# Patient Record
Sex: Male | Born: 1945 | Race: White | Hispanic: No | Marital: Married | Smoking: Never smoker
Health system: Southern US, Community
[De-identification: ages and names within clinical notes are randomized; demographics above are authoritative.]

## PROBLEM LIST (undated history)

## (undated) DIAGNOSIS — G629 Polyneuropathy, unspecified: Secondary | ICD-10-CM

## (undated) DIAGNOSIS — I10 Essential (primary) hypertension: Secondary | ICD-10-CM

## (undated) DIAGNOSIS — C20 Malignant neoplasm of rectum: Secondary | ICD-10-CM

## (undated) DIAGNOSIS — Z77098 Contact with and (suspected) exposure to other hazardous, chiefly nonmedicinal, chemicals: Secondary | ICD-10-CM

## (undated) DIAGNOSIS — E119 Type 2 diabetes mellitus without complications: Secondary | ICD-10-CM

## (undated) DIAGNOSIS — Z978 Presence of other specified devices: Secondary | ICD-10-CM

## (undated) DIAGNOSIS — Z960 Presence of urogenital implants: Secondary | ICD-10-CM

## (undated) DIAGNOSIS — Z7739 Contact with and (suspected) exposure to other war theater: Secondary | ICD-10-CM

## (undated) DIAGNOSIS — G20A1 Parkinson's disease without dyskinesia, without mention of fluctuations: Secondary | ICD-10-CM

## (undated) HISTORY — PX: TONSILLECTOMY: SUR1361

## (undated) HISTORY — PX: ILEOSTOMY: SHX1783

---

## 2018-04-03 ENCOUNTER — Other Ambulatory Visit: Payer: Self-pay

## 2018-04-03 ENCOUNTER — Emergency Department
Admission: EM | Admit: 2018-04-03 | Discharge: 2018-04-03 | Disposition: A | Discharge status: Other Acute Inpatient Hospital | Payer: PPO | Attending: Emergency Medicine | Admitting: Emergency Medicine

## 2018-04-03 ENCOUNTER — Encounter: Payer: Self-pay | Admitting: Emergency Medicine

## 2018-04-03 ENCOUNTER — Emergency Department: Payer: PPO

## 2018-04-03 DIAGNOSIS — Z85048 Personal history of other malignant neoplasm of rectum, rectosigmoid junction, and anus: Secondary | ICD-10-CM | POA: Diagnosis not present

## 2018-04-03 DIAGNOSIS — A419 Sepsis, unspecified organism: Secondary | ICD-10-CM | POA: Insufficient documentation

## 2018-04-03 DIAGNOSIS — E119 Type 2 diabetes mellitus without complications: Secondary | ICD-10-CM | POA: Insufficient documentation

## 2018-04-03 DIAGNOSIS — T8149XA Infection following a procedure, other surgical site, initial encounter: Secondary | ICD-10-CM | POA: Insufficient documentation

## 2018-04-03 DIAGNOSIS — K6811 Postprocedural retroperitoneal abscess: Secondary | ICD-10-CM | POA: Diagnosis not present

## 2018-04-03 DIAGNOSIS — T8143XA Infection following a procedure, organ and space surgical site, initial encounter: Secondary | ICD-10-CM

## 2018-04-03 DIAGNOSIS — Y658 Other specified misadventures during surgical and medical care: Secondary | ICD-10-CM | POA: Insufficient documentation

## 2018-04-03 DIAGNOSIS — N400 Enlarged prostate without lower urinary tract symptoms: Secondary | ICD-10-CM | POA: Diagnosis not present

## 2018-04-03 DIAGNOSIS — I10 Essential (primary) hypertension: Secondary | ICD-10-CM | POA: Insufficient documentation

## 2018-04-03 HISTORY — DX: Type 2 diabetes mellitus without complications: E11.9

## 2018-04-03 HISTORY — DX: Essential (primary) hypertension: I10

## 2018-04-03 HISTORY — DX: Malignant neoplasm of rectum: C20

## 2018-04-03 LAB — CBC WITH DIFFERENTIAL/PLATELET
Abs Immature Granulocytes: 0.15 10*3/uL — ABNORMAL HIGH (ref 0.00–0.07)
BASOS ABS: 0.1 10*3/uL (ref 0.0–0.1)
BASOS PCT: 1 %
EOS PCT: 6 %
Eosinophils Absolute: 0.7 10*3/uL — ABNORMAL HIGH (ref 0.0–0.5)
HCT: 27.8 % — ABNORMAL LOW (ref 39.0–52.0)
Hemoglobin: 9 g/dL — ABNORMAL LOW (ref 13.0–17.0)
IMMATURE GRANULOCYTES: 1 %
Lymphocytes Relative: 9 %
Lymphs Abs: 1 10*3/uL (ref 0.7–4.0)
MCH: 27.8 pg (ref 26.0–34.0)
MCHC: 32.4 g/dL (ref 30.0–36.0)
MCV: 85.8 fL (ref 80.0–100.0)
Monocytes Absolute: 1 10*3/uL (ref 0.1–1.0)
Monocytes Relative: 9 %
Neutro Abs: 8.3 10*3/uL — ABNORMAL HIGH (ref 1.7–7.7)
Neutrophils Relative %: 74 %
PLATELETS: 443 10*3/uL — AB (ref 150–400)
RBC: 3.24 MIL/uL — ABNORMAL LOW (ref 4.22–5.81)
RDW: 14.3 % (ref 11.5–15.5)
WBC: 11.2 10*3/uL — AB (ref 4.0–10.5)
nRBC: 0 % (ref 0.0–0.2)

## 2018-04-03 LAB — COMPREHENSIVE METABOLIC PANEL
ALBUMIN: 3.3 g/dL — AB (ref 3.5–5.0)
ALT: 37 U/L (ref 0–44)
ANION GAP: 10 (ref 5–15)
AST: 31 U/L (ref 15–41)
Alkaline Phosphatase: 61 U/L (ref 38–126)
BILIRUBIN TOTAL: 0.7 mg/dL (ref 0.3–1.2)
BUN: 25 mg/dL — AB (ref 8–23)
CHLORIDE: 104 mmol/L (ref 98–111)
CO2: 20 mmol/L — ABNORMAL LOW (ref 22–32)
Calcium: 9.3 mg/dL (ref 8.9–10.3)
Creatinine, Ser: 1.59 mg/dL — ABNORMAL HIGH (ref 0.61–1.24)
GFR calc Af Amer: 49 mL/min — ABNORMAL LOW (ref >60)
GFR, EST NON AFRICAN AMERICAN: 42 mL/min — AB (ref >60)
GLUCOSE: 145 mg/dL — AB (ref 70–99)
POTASSIUM: 4.1 mmol/L (ref 3.5–5.1)
Sodium: 134 mmol/L — ABNORMAL LOW (ref 135–145)
TOTAL PROTEIN: 7.3 g/dL (ref 6.5–8.1)

## 2018-04-03 LAB — LIPASE, BLOOD: LIPASE: 52 U/L — AB (ref 11–51)

## 2018-04-03 LAB — LACTIC ACID, PLASMA: Lactic Acid, Venous: 0.7 mmol/L (ref 0.5–1.9)

## 2018-04-03 MED ORDER — METRONIDAZOLE IN NACL 5-0.79 MG/ML-% IV SOLN
500.0000 mg | Freq: Three times a day (TID) | INTRAVENOUS | Status: DC
  Administered 2018-04-03: 500 mg via INTRAVENOUS
  Filled 2018-04-03: qty 100

## 2018-04-03 MED ORDER — SODIUM CHLORIDE 0.9 % IV SOLN
1.0000 g | Freq: Three times a day (TID) | INTRAVENOUS | Status: DC
  Filled 2018-04-03 (×2): qty 1

## 2018-04-03 MED ORDER — IOHEXOL 300 MG/ML  SOLN
75.0000 mL | Freq: Once | INTRAMUSCULAR | Status: AC | PRN
  Administered 2018-04-03: 75 mL via INTRAVENOUS

## 2018-04-03 MED ORDER — SODIUM CHLORIDE 0.9 % IV BOLUS
1000.0000 mL | Freq: Once | INTRAVENOUS | Status: AC
  Administered 2018-04-03: 1000 mL via INTRAVENOUS

## 2018-04-03 MED ORDER — SODIUM CHLORIDE 0.9 % IV SOLN
2.0000 g | Freq: Once | INTRAVENOUS | Status: AC
  Administered 2018-04-03: 2 g via INTRAVENOUS
  Filled 2018-04-03: qty 2

## 2018-04-03 MED ORDER — MORPHINE SULFATE (PF) 2 MG/ML IV SOLN
2.0000 mg | Freq: Once | INTRAVENOUS | Status: AC
  Administered 2018-04-03: 2 mg via INTRAVENOUS
  Filled 2018-04-03: qty 1

## 2018-04-03 NOTE — Progress Notes (Signed)
Pharmacy Antibiotic Note  Justin Mann is a 72 y.o. male admitted on 04/03/2018 with intra-abdominal infx.  Pharmacy has been consulted for aztreonam dosing. Patient received aztreonam 2g IV x 1 in the ED. Patient is also on flagyl 500 mg IV q8h  Plan: Will continue aztreonam 1g IV q8h  Height: 5\' 10"  (177.8 cm) Weight: 185 lb (83.9 kg) IBW/kg (Calculated) : 73  Temp (24hrs), Avg:98.1 F (36.7 C), Min:98.1 F (36.7 C), Max:98.1 F (36.7 C)  Recent Labs  Lab 04/03/18 0240 04/03/18 0438  WBC 11.2*  --   CREATININE 1.59*  --   LATICACIDVEN  --  0.7    Estimated Creatinine Clearance: 44.6 mL/min (A) (by C-G formula based on SCr of 1.59 mg/dL (H)).    Allergies  Allergen Reactions  . Erythromycin   . Penicillins     Thank you for allowing pharmacy to be a part of this patient's care.  Tobie Lords, PharmD, BCPS Clinical Pharmacist 04/03/2018

## 2018-04-03 NOTE — ED Triage Notes (Addendum)
Pt to triage via w/c with no distress noted; pt reports blood noted in ileostomy bag tonight with lower abd pain; denies hx of same; hx rectal CA (pt at [place] with recent admission)

## 2018-04-03 NOTE — ED Notes (Signed)
Ileostomy site intact; pt denies needs; family at bedside.

## 2018-04-03 NOTE — Progress Notes (Signed)
CODE SEPSIS - PHARMACY COMMUNICATION  **Broad Spectrum Antibiotics should be administered within 1 hour of Sepsis diagnosis**  Time Code Sepsis Called/Page Received: 0086  Antibiotics Ordered: aztreonam/flagyl  Time of 1st antibiotic administration: 0505  Additional action taken by pharmacy:   If necessary, Name of Provider/Nurse Contacted:     Tobie Lords ,PharmD Clinical Pharmacist  04/03/2018  5:39 AM

## 2018-04-03 NOTE — ED Notes (Signed)
Ileostomy pouch and foley bag emptied.

## 2018-04-03 NOTE — ED Notes (Signed)
Pt and wife updated that transport is here.

## 2018-04-03 NOTE — ED Provider Notes (Signed)
[facility] Emergency Department Provider Note    First MD Initiated Contact with Patient 04/03/18 [phone]     (approximate)  I have reviewed the triage vital signs and the nursing notes.   HISTORY  Chief Complaint Abdominal Pain    HPI Justin Mann is a 72 y.o. male with below list of chronic medical conditions including rectal cancer status post resection at the [place] on September 13 of last month presents to the emergency department with increasing abdominal pain and bright red blood per rectum and in his ileostomy.  Patient states current pain score is 9 out of 10.  Patient denies any fever   Past Medical History:  Diagnosis Date  . Diabetes mellitus without complication ([place])   . Hypertension   . Rectal cancer ([place])     There are no active problems to display for this patient.   Past Surgical History:  Procedure Laterality Date  . ILEOSTOMY    . TONSILLECTOMY      Prior to Admission medications   Not on File    Allergies Erythromycin and Penicillins  No family history on file.  Social History Social History   Tobacco Use  . Smoking status: Not on file  Substance Use Topics  . Alcohol use: Not on file  . Drug use: Not on file    Review of Systems Constitutional: No fever/chills Eyes: No visual changes. ENT: No sore throat. Cardiovascular: Denies chest pain. Respiratory: Denies shortness of breath. Gastrointestinal: No abdominal pain.  No nausea, no vomiting.  No diarrhea.  No constipation. Genitourinary: Negative for dysuria. Musculoskeletal: Negative for neck pain.  Negative for back pain. Integumentary: Negative for rash. Neurological: Negative for headaches, focal weakness or numbness.  ____________________________________________   PHYSICAL EXAM:  VITAL SIGNS: ED Triage Vitals [04/03/18 0218]  Enc Vitals Group     BP (!) 109/54     Pulse Rate 100     Resp 18     Temp 98.1 F (36.7 C)     Temp Source Oral   SpO2 98 %     Weight 83.9 kg (185 lb)     Height 1.778 m (5\' 10" )     Head Circumference      Peak Flow      Pain Score 6     Pain Loc      Pain Edu?      Excl. in [place]?     Constitutional: Alert and oriented. Apparent discomfort Eyes: Conjunctivae are normal. Mouth/Throat: MucousNo meningeal signs.   Cardiovascular: Normal rate, regular rhythm. Good peripheral circulation. Grossly normal heart sounds. Respiratory: Normal respiratory effort.  No retractions. Lungs CTAB. Gastrointestinal: LUQ/LLQ pain. No distention.  Musculoskeletal: No lower extremity tenderness nor edema. No gross deformities of extremities. Neurologic:  Normal speech and language. No gross focal neurologic deficits are appreciated.  Skin:  Skin is warm, dry and intact. No rash noted. Psychiatric: Mood and affect are normal. Speech and behavior are normal.  ____________________________________________   LABS (all labs ordered are listed, but only abnormal results are displayed)  Labs Reviewed  CBC WITH DIFFERENTIAL/PLATELET - Abnormal; Notable for the following components:      Result Value   WBC 11.2 (*)    RBC 3.24 (*)    Hemoglobin 9.0 (*)    HCT 27.8 (*)    Platelets 443 (*)    Neutro Abs 8.3 (*)    Eosinophils Absolute 0.7 (*)    Abs Immature Granulocytes 0.15 (*)  All other components within normal limits  COMPREHENSIVE METABOLIC PANEL - Abnormal; Notable for the following components:   Sodium 134 (*)    CO2 20 (*)    Glucose, Bld 145 (*)    BUN 25 (*)    Creatinine, Ser 1.59 (*)    Albumin 3.3 (*)    GFR calc non Af Amer 42 (*)    GFR calc Af Amer 49 (*)    All other components within normal limits  LIPASE, BLOOD - Abnormal; Notable for the following components:   Lipase 52 (*)    All other components within normal limits  CULTURE, BLOOD (ROUTINE X 2)  CULTURE, BLOOD (ROUTINE X 2)  LACTIC ACID, PLASMA  LACTIC ACID, PLASMA   ____________________________________________  EKG  ED ECG  REPORT I, Holbrook N Maxximus Gotay, the attending physician, personally viewed and interpreted this ECG.   Date: 04/03/2018  EKG Time: 5:15AM  Rate: 78  Rhythm: Normal Sinus Rhythm  Axis: Normal  Intervals:Normal  ST&T Change: None  ____________________________________________  RADIOLOGY I,  Ernst Bowler, personally viewed and evaluated these images (plain radiographs) as part of my medical decision making, as well as reviewing the written report by the radiologist.  ED MD interpretation:    Official radiology report(s): Ct Abdomen Pelvis W Contrast  Result Date: 04/03/2018 CLINICAL DATA:  Blood in ileostomy bag.  History of rectal cancer. EXAM: CT ABDOMEN AND PELVIS WITH CONTRAST TECHNIQUE: Multidetector CT imaging of the abdomen and pelvis was performed using the standard protocol following bolus administration of intravenous contrast. CONTRAST:  66mL OMNIPAQUE IOHEXOL 300 MG/ML  SOLN COMPARISON:  None. FINDINGS: LOWER CHEST: There is no basilar pleural or apical pericardial effusion. There are coronary artery calcifications. HEPATOBILIARY: The hepatic contours and density are normal. There is no intra- or extrahepatic biliary dilatation. The gallbladder is normal. PANCREAS: The pancreatic parenchymal contours are normal and there is no ductal dilatation. There is no peripancreatic fluid collection. SPLEEN: Normal. ADRENALS/URINARY TRACT: --Adrenal glands: Normal. --Right kidney/ureter: No hydronephrosis, nephroureterolithiasis, perinephric stranding or solid renal mass. --Left kidney/ureter: No hydronephrosis, nephroureterolithiasis, perinephric stranding or solid renal mass. There are multiple renal cysts, measuring up to 3.7 cm. --Urinary bladder: Decompressed by Foley catheter. STOMACH/BOWEL: --Stomach/Duodenum: There is no hiatal hernia or other gastric abnormality. The duodenal course and caliber are normal. --Small bowel: No dilatation or inflammation. --Colon: The rectum has been resected.  There is a right lower quadrant ileostomy. There is a gas containing presacral collection that measures the 0.7 x 8.7 x 9.3 cm. There is inflammatory change throughout the lower abdominal peritoneal fat --Appendix: Normal. VASCULAR/LYMPHATIC: There is atherosclerotic calcification of the non aneurysmal abdominal aorta. No abdominal or pelvic lymphadenopathy. REPRODUCTIVE: Enlarged prostate measures 5.2 cm in transverse dimension. MUSCULOSKELETAL. No bony spinal canal stenosis or focal osseous abnormality. OTHER: None. IMPRESSION: 1. Status post rectal resection and formation of right lower quadrant ileostomy. Gas-containing collection in the presacral space is consistent with abscess or unencapsulated infected collection, possibly arising from a leak at the surgical site. Without comparison to preoperative imaging, a partially necrotic residual mass would be difficult to exclude, but this is less likely. 2. Coronary artery and aortic atherosclerosis (ICD10-I70.0). Electronically Signed   By: Ulyses Jarred M.D.   On: 04/03/2018 04:11     Critical Care performed:   .Critical Care Performed by: Gregor Hams, MD Authorized by: Gregor Hams, MD   Critical care provider statement:    Critical care time (minutes):  30   Critical  care time was exclusive of:  Separately billable procedures and treating other patients   Critical care was time spent personally by me on the following activities:  Development of treatment plan with patient or surrogate, discussions with consultants, evaluation of patient's response to treatment, examination of patient, obtaining history from patient or surrogate, ordering and performing treatments and interventions, ordering and review of laboratory studies, ordering and review of radiographic studies, pulse oximetry, re-evaluation of patient's condition and review of old charts   I assumed direction of critical care for this patient from another provider in my specialty:  no       ____________________________________________   INITIAL IMPRESSION / ASSESSMENT AND PLAN / ED COURSE  As part of my medical decision making, I reviewed the following data within the [place]   6-year-old male presented with above-stated history and physical exam concerning for intra-abdominal pathology including abscess and anastomotic leak versus other potential intra-abdominal pathology.  CT scan consistent with possible anastomotic leak with abscess formation in the pelvis presacral region.  Patient discussed with Dr. Hampton Abbot the [facility] who accepted patient in transfer.  Patient was given metronidazole and aztreonam secondary to penicillin allergy.  In addition patient given 2 L IV normal saline with improvement of blood pressure patient's initial blood pressure was 96 systolic raising concern for possible sepsis.    ____________________________________________  FINAL CLINICAL IMPRESSION(S) / ED DIAGNOSES  Final diagnoses:  Sepsis, due to unspecified organism, unspecified whether acute organ dysfunction present ([place])  Postprocedural intraabdominal abscess     MEDICATIONS GIVEN DURING THIS VISIT:  Medications  metroNIDAZOLE (FLAGYL) IVPB 500 mg (500 mg Intravenous New Bag/Given 04/03/18 0513)  aztreonam (AZACTAM) 1 g in sodium chloride 0.9 % 100 mL IVPB (has no administration in time range)  sodium chloride 0.9 % bolus 1,000 mL (1,000 mLs Intravenous New Bag/Given 04/03/18 0313)  morphine 2 MG/ML injection 2 mg (2 mg Intravenous Given 04/03/18 0424)  iohexol (OMNIPAQUE) 300 MG/ML solution 75 mL (75 mLs Intravenous Contrast Given 04/03/18 0329)  aztreonam (AZACTAM) 2 g in sodium chloride 0.9 % 100 mL IVPB (2 g Intravenous New Bag/Given 04/03/18 0505)     ED Discharge Orders    None       Note:  This document was prepared using Dragon voice recognition software and may include unintentional dictation errors.    Gregor Hams, MD 04/03/18 [phone]

## 2018-04-03 NOTE — ED Notes (Signed)
Pt. Gone to CT 

## 2018-04-08 LAB — CULTURE, BLOOD (ROUTINE X 2)
CULTURE: NO GROWTH
Culture: NO GROWTH
SPECIAL REQUESTS: ADEQUATE

## 2018-04-16 ENCOUNTER — Emergency Department: Payer: PPO

## 2018-04-16 ENCOUNTER — Other Ambulatory Visit: Payer: Self-pay

## 2018-04-16 ENCOUNTER — Emergency Department
Admission: EM | Admit: 2018-04-16 | Discharge: 2018-04-16 | Disposition: A | Discharge status: Home / Self Care | Payer: PPO | Attending: Emergency Medicine | Admitting: Emergency Medicine

## 2018-04-16 DIAGNOSIS — N281 Cyst of kidney, acquired: Secondary | ICD-10-CM | POA: Diagnosis not present

## 2018-04-16 DIAGNOSIS — I10 Essential (primary) hypertension: Secondary | ICD-10-CM | POA: Diagnosis not present

## 2018-04-16 DIAGNOSIS — R319 Hematuria, unspecified: Secondary | ICD-10-CM | POA: Diagnosis not present

## 2018-04-16 DIAGNOSIS — T839XXA Unspecified complication of genitourinary prosthetic device, implant and graft, initial encounter: Secondary | ICD-10-CM

## 2018-04-16 DIAGNOSIS — Z85048 Personal history of other malignant neoplasm of rectum, rectosigmoid junction, and anus: Secondary | ICD-10-CM | POA: Insufficient documentation

## 2018-04-16 DIAGNOSIS — Y731 Therapeutic (nonsurgical) and rehabilitative gastroenterology and urology devices associated with adverse incidents: Secondary | ICD-10-CM | POA: Insufficient documentation

## 2018-04-16 DIAGNOSIS — E860 Dehydration: Secondary | ICD-10-CM | POA: Insufficient documentation

## 2018-04-16 DIAGNOSIS — N179 Acute kidney failure, unspecified: Secondary | ICD-10-CM | POA: Diagnosis not present

## 2018-04-16 DIAGNOSIS — E119 Type 2 diabetes mellitus without complications: Secondary | ICD-10-CM | POA: Insufficient documentation

## 2018-04-16 DIAGNOSIS — T83091A Other mechanical complication of indwelling urethral catheter, initial encounter: Secondary | ICD-10-CM | POA: Diagnosis not present

## 2018-04-16 HISTORY — DX: Presence of other specified devices: Z97.8

## 2018-04-16 HISTORY — DX: Presence of urogenital implants: Z96.0

## 2018-04-16 LAB — COMPREHENSIVE METABOLIC PANEL
ALT: 44 U/L (ref 0–44)
AST: 28 U/L (ref 15–41)
Albumin: 3.7 g/dL (ref 3.5–5.0)
Alkaline Phosphatase: 63 U/L (ref 38–126)
Anion gap: 12 (ref 5–15)
BUN: 32 mg/dL — AB (ref 8–23)
CALCIUM: 9.7 mg/dL (ref 8.9–10.3)
CO2: 23 mmol/L (ref 22–32)
CREATININE: 2.08 mg/dL — AB (ref 0.61–1.24)
Chloride: 96 mmol/L — ABNORMAL LOW (ref 98–111)
GFR, EST AFRICAN AMERICAN: 35 mL/min — AB (ref >60)
GFR, EST NON AFRICAN AMERICAN: 31 mL/min — AB (ref >60)
Glucose, Bld: 128 mg/dL — ABNORMAL HIGH (ref 70–99)
Potassium: 4.4 mmol/L (ref 3.5–5.1)
SODIUM: 131 mmol/L — AB (ref 135–145)
TOTAL PROTEIN: 8 g/dL (ref 6.5–8.1)
Total Bilirubin: 0.8 mg/dL (ref 0.3–1.2)

## 2018-04-16 LAB — URINALYSIS, COMPLETE (UACMP) WITH MICROSCOPIC
BACTERIA UA: NONE SEEN
Bilirubin Urine: NEGATIVE
GLUCOSE, UA: NEGATIVE mg/dL
KETONES UR: NEGATIVE mg/dL
NITRITE: NEGATIVE
PROTEIN: 30 mg/dL — AB
RBC / HPF: >50 RBC/hpf — ABNORMAL HIGH (ref 0–5)
SQUAMOUS EPITHELIAL / LPF: NONE SEEN (ref 0–5)
Specific Gravity, Urine: 1.011 (ref 1.005–1.030)
pH: 6 (ref 5.0–8.0)

## 2018-04-16 LAB — CBC
HCT: 27.3 % — ABNORMAL LOW (ref 39.0–52.0)
Hemoglobin: 8.8 g/dL — ABNORMAL LOW (ref 13.0–17.0)
MCH: 27.7 pg (ref 26.0–34.0)
MCHC: 32.2 g/dL (ref 30.0–36.0)
MCV: 85.8 fL (ref 80.0–100.0)
PLATELETS: 379 10*3/uL (ref 150–400)
RBC: 3.18 MIL/uL — AB (ref 4.22–5.81)
RDW: 14.1 % (ref 11.5–15.5)
WBC: 11.5 10*3/uL — ABNORMAL HIGH (ref 4.0–10.5)
nRBC: 0 % (ref 0.0–0.2)

## 2018-04-16 MED ORDER — SODIUM CHLORIDE 0.9 % IV BOLUS: 1000.0000 mL | Freq: Once | INTRAVENOUS | Status: DC

## 2018-04-16 NOTE — ED Notes (Signed)
Pt back from US

## 2018-04-16 NOTE — ED Notes (Signed)
Pt angry about wait time. Informed that ED is very busy and that staff is working to get pt to a room as soon as possible. Informed he is near top of waiting list.

## 2018-04-16 NOTE — ED Notes (Signed)
Unable to get BP before patient discharged. Vital WNL prior to discharge. MD aware and okayed.

## 2018-04-16 NOTE — ED Notes (Signed)
Pt asked to be straight stuck instead of IV. MD notified.

## 2018-04-16 NOTE — ED Notes (Signed)
Pt urine bag draining properly at this time. MD at bedside.

## 2018-04-16 NOTE — ED Notes (Signed)
76ml in leg bag over 5-6 hours per pt.

## 2018-04-16 NOTE — ED Notes (Signed)
Pt stated that he talked to MD and did not want the fluid and wanted to go home. Waiting on discharge paperwork.

## 2018-04-16 NOTE — ED Provider Notes (Signed)
[facility] Emergency Department Provider Note   ____________________________________________   First MD Initiated Contact with Patient 04/16/18 1924     (approximate)  I have reviewed the triage vital signs and the nursing notes.   HISTORY  Chief Complaint Foley Cath Problem    HPI Justin Mann is a 72 y.o. male with a history of diabetes, rectal cancer, ileostomy.  Patient reports he is recently been having visits to the [facility] for follow-up regarding his prostate cancer, is currently has an indwelling Foley catheter that is been present since he had a recent surgery for an abscess drainage at the [place].  He is noticed today that there is a small amount of blood in his Foley catheter, he reports is just a tiny amount of small clot may be about the size of a dime.  No pain.  No nausea vomiting.  No fevers or chills.  No fatigue or weakness of than his baseline.  He went to the [place] today for a CT scan to follow-up, but reports he did not have it completed because he reported he had had too many CT scans recently at the [place].  He reports the Foley catheter has drained, he emptied a full bag of urine at about 5 PM.  He has not noticed a slight blood tingeing to the urine which prompted him to come for evaluation.   Past Medical History:  Diagnosis Date  . Diabetes mellitus without complication ([place])   . Foley catheter in place   . Hypertension   . Rectal cancer ([place])     There are no active problems to display for this patient.   Past Surgical History:  Procedure Laterality Date  . ILEOSTOMY    . TONSILLECTOMY      Prior to Admission medications   Not on File    Allergies Erythromycin and Penicillins  History reviewed. No pertinent family history.  Social History Social History   Tobacco Use  . Smoking status: Never Smoker  Substance Use Topics  . Alcohol use: Yes    Comment: occasionally   . Drug use: Not on file    Review  of Systems Constitutional: No fever/chills Eyes: No visual changes. ENT: No sore throat. Cardiovascular: Denies chest pain. Respiratory: Denies shortness of breath. Gastrointestinal: No abdominal pain.  Catheter is not causing him any pain or discomfort. Genitourinary: No thickening or pus noted in the catheter.  No dark drainage except for slight blood tingeing.  Denies the catheter is causing any pain.  No urge to void. Musculoskeletal: Negative for back pain. Skin: Negative for rash. Neurological: Negative for headaches, areas of focal weakness or numbness.    ____________________________________________   PHYSICAL EXAM:  VITAL SIGNS: ED Triage Vitals  Enc Vitals Group     BP 04/16/18 1645 (!) 81/45 (suspect incorrect)     Pulse Rate 04/16/18 1645 86     Resp 04/16/18 1645 20     Temp 04/16/18 1645 98.2 F (36.8 C)     Temp Source 04/16/18 1645 Oral     SpO2 04/16/18 1645 98 %     Weight 04/16/18 1646 185 lb (83.9 kg)     Height 04/16/18 1646 5\' 10"  (1.778 m)     Head Circumference --      Peak Flow --      Pain Score 04/16/18 1646 4     Pain Loc --      Pain Edu? --  Excl. in [place]? --   Of note the patient's initial blood pressure at triage was noted to be 81/45, however on arrival to treatment bed without any intervention his blood pressure 119/73.  He denies any weakness, lightheadedness or other orthostatic type symptoms.  Constitutional: Alert and oriented. Well appearing and in no acute distress. Eyes: Conjunctivae are normal. Head: Atraumatic. Nose: No congestion/rhinnorhea. Mouth/Throat: Mucous membranes are moist. Neck: No stridor.  Cardiovascular: Normal rate, regular rhythm. Grossly normal heart sounds.  Good peripheral circulation. Respiratory: Normal respiratory effort.  No retractions. Lungs CTAB. Gastrointestinal: Soft and nontender. No distention.  Circumcised penis.  Foley catheter with slight red tingeing of urine mixed with light yellow urine  noted.  Foley catheter draining to the leg bag without clots or evidence of occlusion denoted at present. Musculoskeletal: No lower extremity tenderness nor edema. Neurologic:  Normal speech and language. No gross focal neurologic deficits are appreciated.  Skin:  Skin is warm, dry and intact. No rash noted. Psychiatric: Mood and affect are normal. Speech and behavior are normal.  ____________________________________________   LABS (all labs ordered are listed, but only abnormal results are displayed)  Labs Reviewed  URINALYSIS, COMPLETE (UACMP) WITH MICROSCOPIC - Abnormal; Notable for the following components:      Result Value   Color, Urine YELLOW (*)    APPearance CLEAR (*)    Hgb urine dipstick MODERATE (*)    Protein, ur 30 (*)    Leukocytes, UA TRACE (*)    RBC / HPF >50 (*)    All other components within normal limits  CBC - Abnormal; Notable for the following components:   WBC 11.5 (*)    RBC 3.18 (*)    Hemoglobin 8.8 (*)    HCT 27.3 (*)    All other components within normal limits  COMPREHENSIVE METABOLIC PANEL - Abnormal; Notable for the following components:   Sodium 131 (*)    Chloride 96 (*)    Glucose, Bld 128 (*)    BUN 32 (*)    Creatinine, Ser 2.08 (*)    GFR calc non Af Amer 31 (*)    GFR calc Af Amer 35 (*)    All other components within normal limits  URINE CULTURE   ____________________________________________  EKG   ____________________________________________  RADIOLOGY  US Renal  Result Date: 04/16/2018 CLINICAL DATA:  72 y/o  M; possibly decreased urine output. EXAM: RENAL / URINARY TRACT ULTRASOUND COMPLETE COMPARISON:  04/03/2018 CT abdomen and pelvis. FINDINGS: Right Kidney: Length: 10.9 cm. Echogenicity within normal limits. No mass or hydronephrosis visualized. Left Kidney: Length: 11.9 cm. Inter pole simple cyst measuring up to 3.5 cm. Upper pole simple cyst measuring up to 2.6 cm. Bladder: Appears normal for degree of bladder  distention. Foley catheter within the bladder lumen. IMPRESSION: 1. No acute process identified. 2. Stable left kidney cysts. 3. Foley catheter in the bladder lumen. Electronically Signed   By: Kristine Garbe M.D.   On: 04/16/2018 20:52    Ultrasound results reviewed, no acute process.  Foley catheter noted in bladder ____________________________________________   PROCEDURES  Procedure(s) performed: None  Procedures  Critical Care performed: No  ____________________________________________   INITIAL IMPRESSION / ASSESSMENT AND PLAN / ED COURSE  Pertinent labs & imaging results that were available during my care of the patient were reviewed by me and considered in my medical decision making (see chart for details).   Patient returns for evaluation after missing a small amount of blood clotting his  Foley catheter.  Currently no evidence of blood clot, but there is slight amount of blood tingeing.  He denies infectious symptoms.  His normal hemodynamics, and is resting comfortably.  Reviewed his lab work, his creatinine is slightly elevated and we discussed with the patient and I recommended placing an IV and giving fluids for some dehydration which I suspect is contributing to his slightly worsening renal function, but patient reports that he can drink fluids well and would prefer rather to be discharged to follow-up with his own doctor at [facility].  He will encourage oral hydration.  Suspect likely patient having some mild dehydration secondary to his ileostomy and lack of colonic absorption.  He is resting quite comfortably with normal hemodynamics.  Send urine for culture, no evidence of infection at this time no bacteria seen.  Ultrasound reassuring.  Has close follow-up with urology, patient will call his [place] team in the morning for close follow-up.  Though my recommendation the patient was to stay for IV fluids, he wished to be discharged home and follow-up closely with his  doctor and will encourage increased oral water intake, I do not think this is unreasonable and via shared medical decision making plan will be to discharge with close follow-up.  No evidence of acute urinary obstruction, no evidence of acute abdomen is reassuring clinical examination.  No neurologic, cardiac pulmonary, vascular or infectious symptoms to noted.      ____________________________________________   FINAL CLINICAL IMPRESSION(S) / ED DIAGNOSES  Final diagnoses:  Hematuria, unspecified type  Dehydration, mild  AKI (acute kidney injury) ([place])  Foley catheter problem, initial encounter [facility])        Note:  This document was prepared using Dragon voice recognition software and may include unintentional dictation errors       Delman Kitten, MD 04/17/18 0019

## 2018-04-16 NOTE — ED Notes (Signed)
Pt seen walking through triage with family member stating "I'm leaving, I'm going to the [place]."

## 2018-04-16 NOTE — ED Triage Notes (Signed)
Pt has indwelling foley. States smaller amount of drainage than normal. States had small amount of drainage around tube- noticed that pants were damp. Pt states changed leg bag earlier today and noticed 1 clot. Afraid tube is clogged. Foley was changed last Thursday. Believe it's an 74F.

## 2018-04-19 LAB — URINE CULTURE
Culture: >=100000 — AB
Special Requests: NORMAL

## 2018-04-21 NOTE — Progress Notes (Signed)
ED Antimicrobial Stewardship Positive Culture Follow Up   Justin Mann is an 72 y.o. male who presented to [facility] on 04/16/2018 with a chief complaint of  Chief Complaint  Patient presents with  . Foley Cath Problem    Recent Results (from the past 720 hour(s))  Blood Culture (routine x 2)     Status: None   Collection Time: 04/03/18  4:38 AM  Result Value Ref Range Status   Specimen Description BLOOD RIGHT [facility]  Final   Special Requests   Final    BOTTLES DRAWN AEROBIC AND ANAEROBIC Blood Culture adequate volume   Culture   Final    NO GROWTH 5 DAYS Performed at [facility], [address] [place], [place] [address]    Report Status 04/08/2018 FINAL  Final  Blood Culture (routine x 2)     Status: None   Collection Time: 04/03/18  4:38 AM  Result Value Ref Range Status   Specimen Description BLOOD LEFT HAND  Final   Special Requests   Final    BOTTLES DRAWN AEROBIC AND ANAEROBIC Blood Culture results may not be optimal due to an excessive volume of blood received in culture bottles   Culture   Final    NO GROWTH 5 DAYS Performed at [facility], [address], [place], [place] [address]    Report Status 04/08/2018 FINAL  Final  Urine Culture     Status: Abnormal   Collection Time: 04/16/18  4:49 PM  Result Value Ref Range Status   Specimen Description   Final    URINE, CATHETERIZED Performed at [facility], [address], [place], [place] [address]    Special Requests   Final    Normal Performed at [facility], [address] [place], [place] [address]    Culture >=100,000 COLONIES/mL ENTEROCOCCUS FAECALIS (A)  Final   Report Status 04/19/2018 FINAL  Final   Organism ID, Bacteria ENTEROCOCCUS FAECALIS (A)  Final      Susceptibility   Enterococcus faecalis - MIC*    AMPICILLIN <=2 SENSITIVE Sensitive     LEVOFLOXACIN >=8 RESISTANT Resistant     NITROFURANTOIN <=16 SENSITIVE Sensitive     VANCOMYCIN 1 SENSITIVE Sensitive      * >=100,000 COLONIES/mL ENTEROCOCCUS FAECALIS    [x]  Patient discharged originally without antimicrobial agent   10/27 Called patient at [phone]. Patient did see urologist on Friday and they did not think that he needed antibiotic tx. Patient says he does not have a fever, etc and does not have blood in urine or around foley at this time. Per discussion with ED MD if patient not having symptoms or fever would hold off on abx tx as may be colonization of indwelling foley catheter. Discussed with patient. If he develops symptoms he should see PCP or urologist. Also suggested to let urologist know of this culture result. If needed treatment would consider Fosfomycin 3gm packet PO every 3 days x 2 days.   ED Provider: Brigitte Pulse A 04/21/2018, 5:14 PM Clinical Pharmacist

## 2018-08-08 ENCOUNTER — Telehealth: Payer: Self-pay | Admitting: General Practice

## 2018-08-08 NOTE — Telephone Encounter (Signed)
Pt appears on [facility] Quality Report for [facility] but has never been seen in the office.  I called the pt to confirm his PCP, and he stated that he goes to the [facility] in [place].  [place] (DD)

## 2018-08-12 ENCOUNTER — Ambulatory Visit: Payer: Self-pay

## 2018-08-12 NOTE — Chronic Care Management (AMB) (Signed)
  Chronic Care Management   Note  08/12/2018 Name: Justin Mann MRN: [id number] DOB: May 18, 1946   Mr. Justin Mann is a 73 year old malewho has [employer] listed as his PCP per [employer] review. CCM RN CM wasasked by HTA to engage patient for possible PCP follow up as he has not been seen in [facility] recently. According to EMR documentation, patient is engaged with the [facility] and receiving primary care services through his [place] benefits. Today I called patient to confirm and left a HIPAA compliant VM message requesting call back.  At 1602 Mr. Justin Mann returned CCM RN CM call. Justin Mann states he has [employer] "just as a back up incase we are ever out of town". He is engaged with his PCP at the [facility], Dr. Alfonse Spruce and will follow up again with Dr. Marshell Levan in June 2020. Justin Mann is also engaged with specialist at [facility]. He recently had surgery for colorectal cancer and states he now "has a leak". He will see his surgeon tomorrow.  Justin Mann states he utilized his [employer] benefits twice in October of 2019 for ED visits to [facility]. He has no plans to utilize his [employer] benefits in the future.   Justin Ebbs E. Rollene Rotunda, RN, BSN Nurse Care Coordinator [facility] Care Management  [phone]

## 2018-08-12 NOTE — Chronic Care Management (AMB) (Signed)
  Chronic Care Management   Note  08/12/2018 Name: Justin Mann MRN: [id number] DOB: 1945/11/23    Mr. Justin Mann is a 73 year old male who has [employer] listed as his PCP per [employer] review. CCM RN CM was asked by HTA to engage patient for possible PCP follow up as he has not been seen in [facility] recently. According to EMR documentation, patient is engaged with the [facility] and receiving primary care services through his [place] benefits. Today I called patient to confirm.  Was unable to reach patient via telephone today. I have left HIPAA compliant voicemail asking patient to return my call. (unsuccessful outreach #[phone]).   Plan: Will follow-up within 7 business days via telephone.    Robt Okuda E. Rollene Rotunda, RN, BSN Nurse Care Coordinator [facility] Care Management  [phone]

## 2018-08-13 NOTE — Chronic Care Management (AMB) (Unsigned)
Erroneous encounter

## 2018-08-13 NOTE — Progress Notes (Deleted)
This encounter was created in error - please disregard.

## 2018-11-20 ENCOUNTER — Other Ambulatory Visit: Payer: Self-pay

## 2019-05-19 ENCOUNTER — Other Ambulatory Visit: Payer: Self-pay

## 2020-03-13 IMAGING — US US RENAL
1 series · 14 of 25 positions shown · non-contrast
Comparison: 04/03/2018 CT abdomen and pelvis.

CLINICAL DATA: 72 y/o  M; possibly decreased urine output.

EXAM:
RENAL / URINARY TRACT ULTRASOUND COMPLETE

[Series 1: us renal · 0.26mm/px · 14 of 30 slices shown]
[im 1/30]
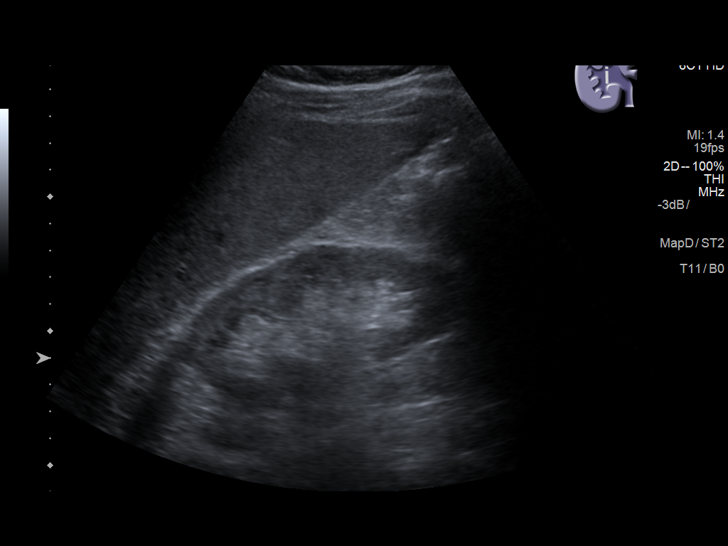
[im 3/30]
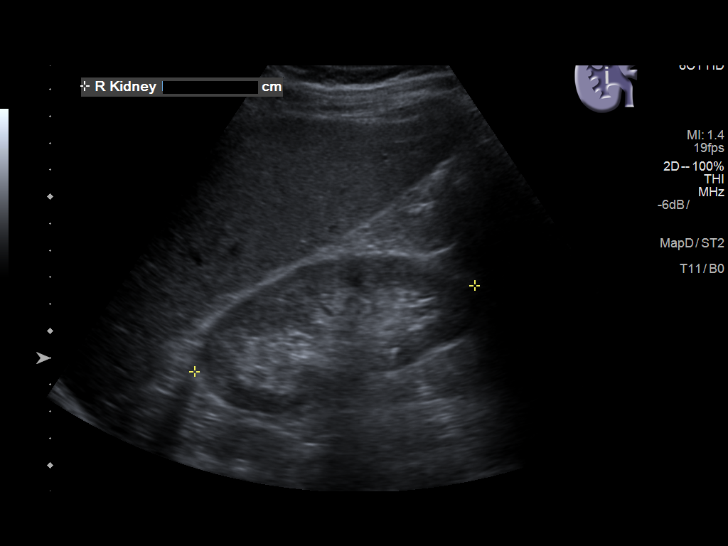
[im 5/30]
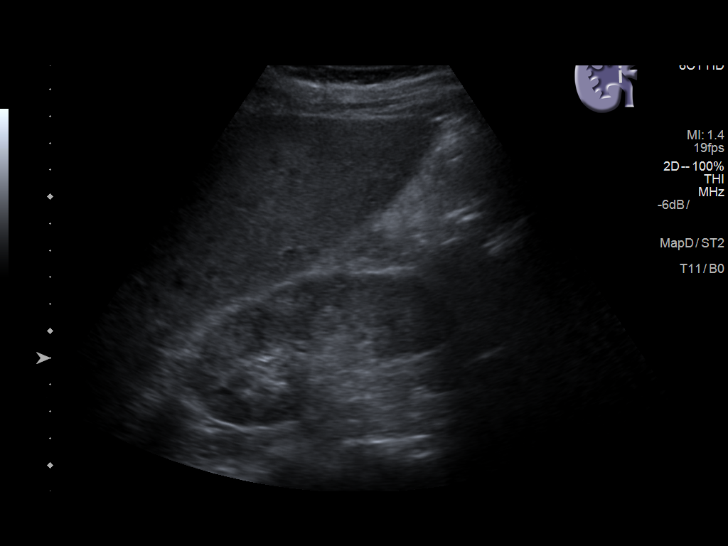
[im 8/30]
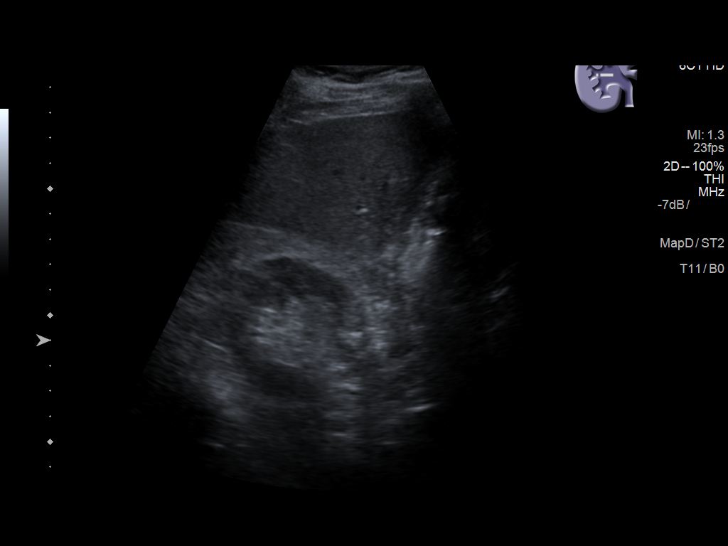
[im 10/30]
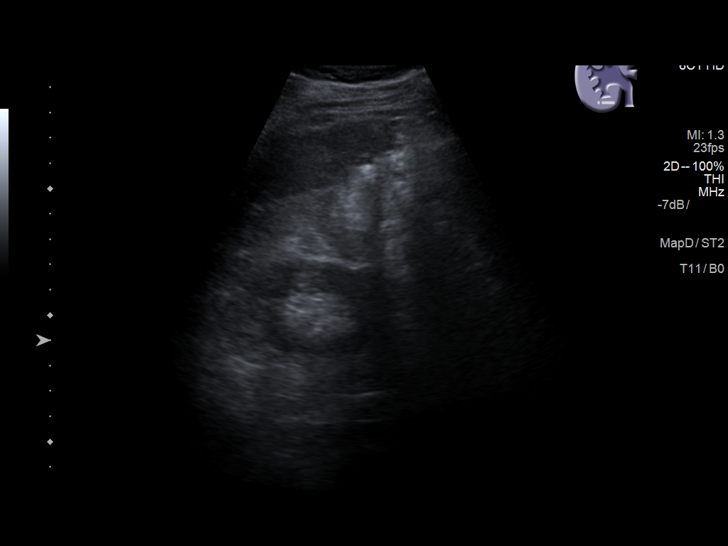
[im 11/30]
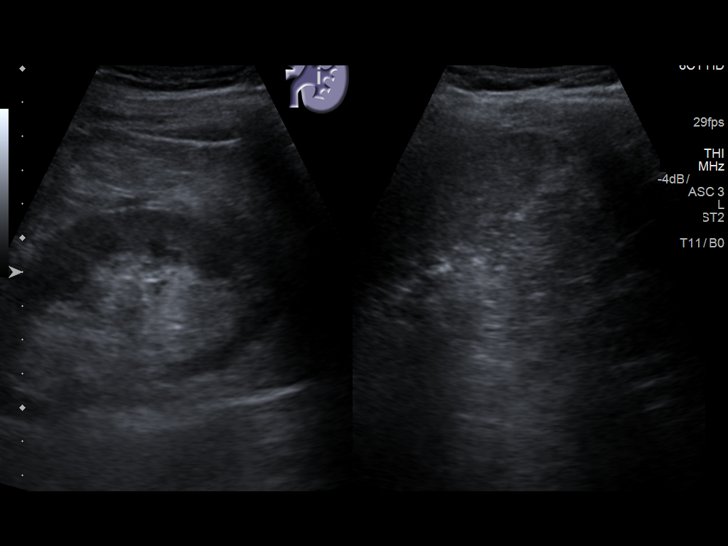
[im 14/30]
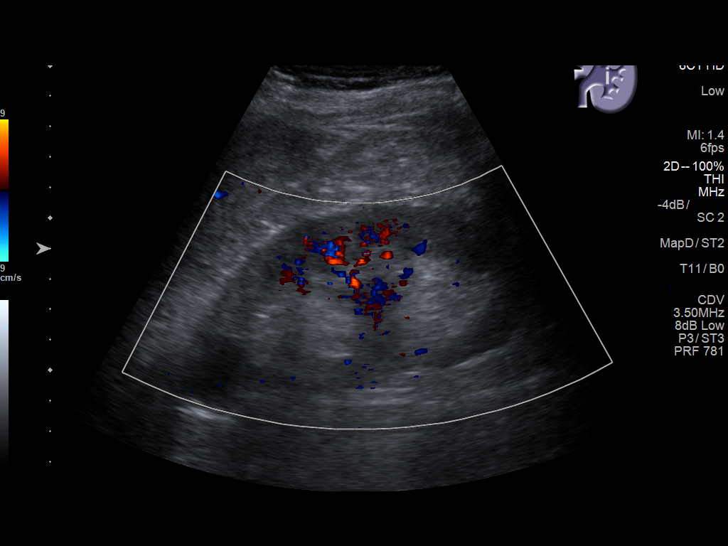
[im 16/30]
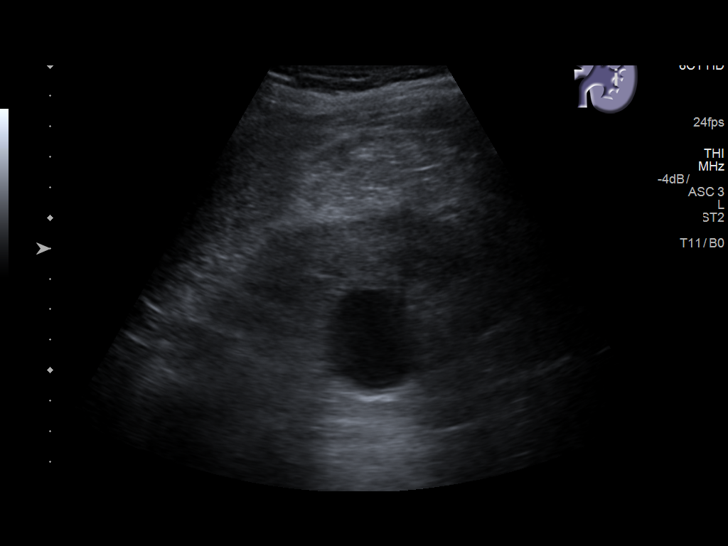
[im 19/30]
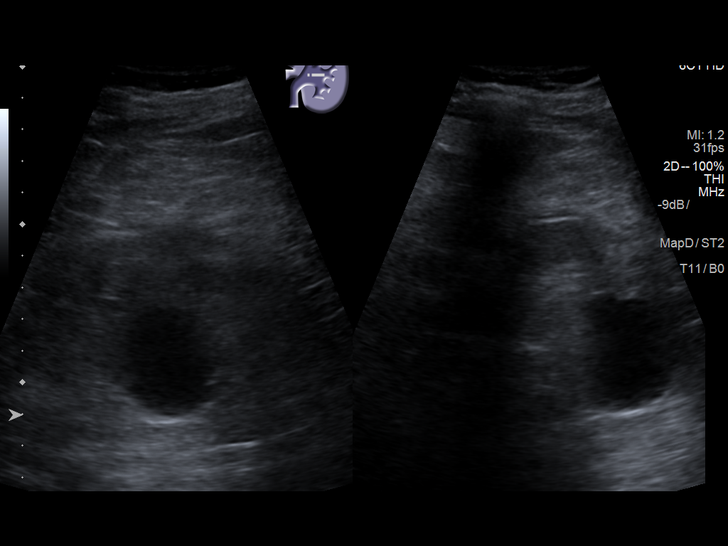
[im 20/30]
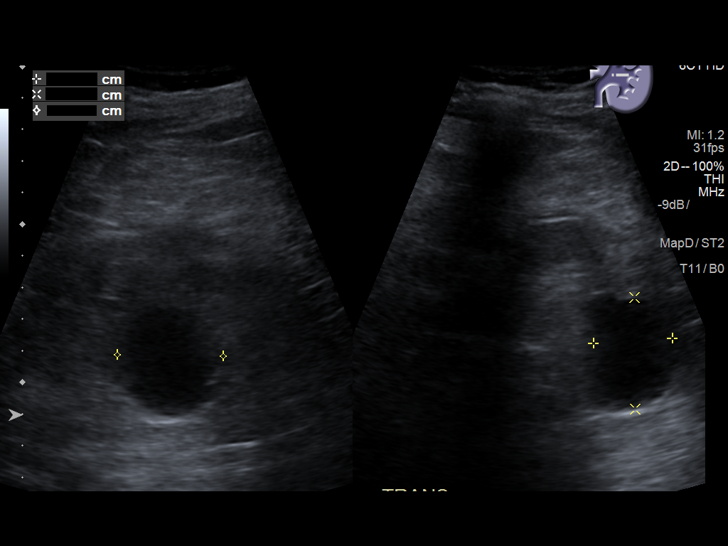
[im 22/30]
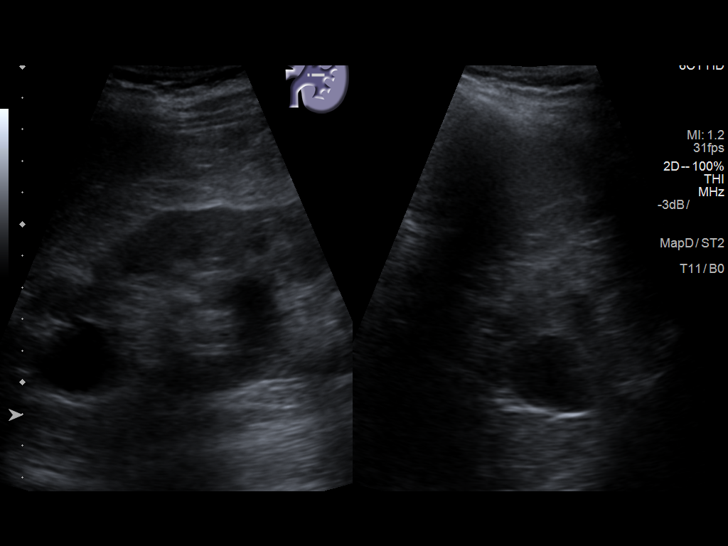
[im 25/30]
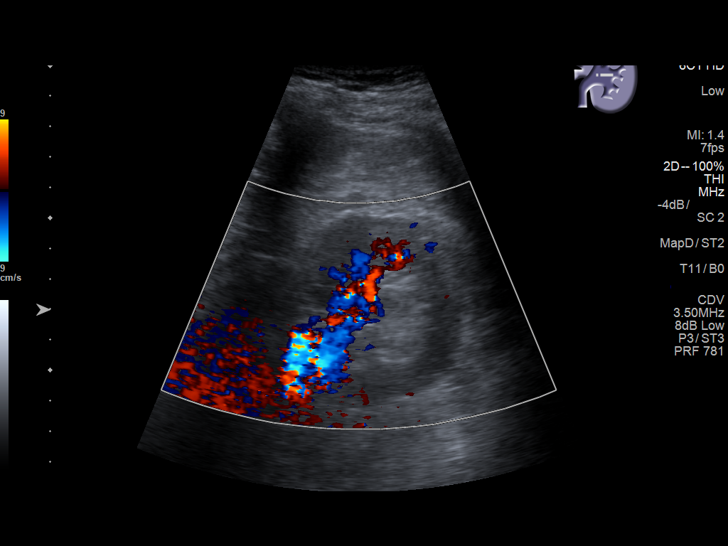
[im 27/30]
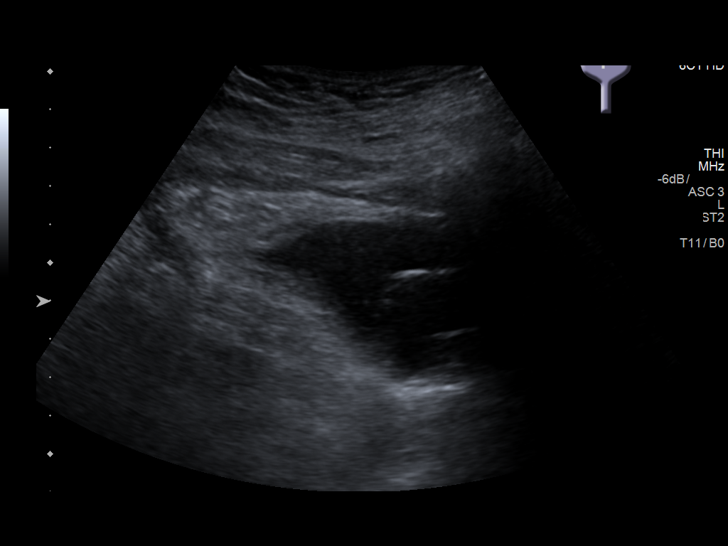
[im 30/30]
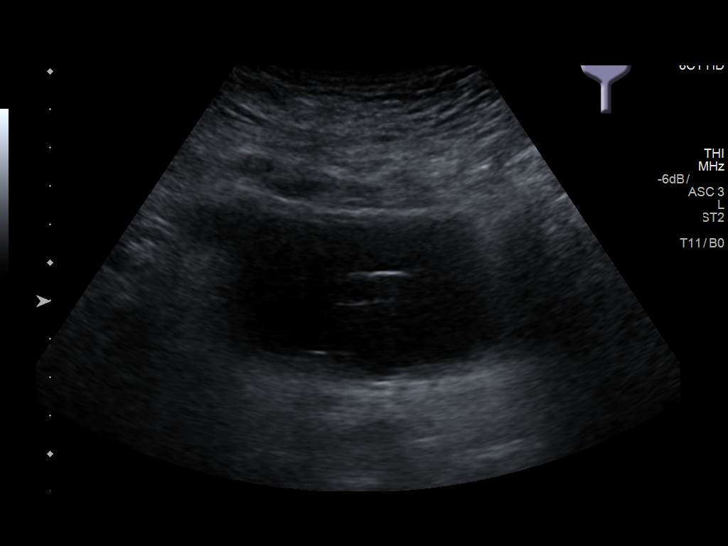

[14 of 25 positions shown; findings below may reference images not displayed]

FINDINGS: Right Kidney:

Length: 10.9 cm. Echogenicity within normal limits. No mass or
hydronephrosis visualized.

Left Kidney:

Length: 11.9 cm. Inter pole simple cyst measuring up to 3.5 cm.
Upper pole simple cyst measuring up to 2.6 cm.

Bladder:

Appears normal for degree of bladder distention. Foley catheter
within the bladder lumen.
IMPRESSION: 1. No acute process identified.
2. Stable left kidney cysts.
3. Foley catheter in the bladder lumen.

By: Dadali Letchmanan M.D.

## 2020-09-27 ENCOUNTER — Other Ambulatory Visit: Payer: Self-pay | Admitting: Neurology

## 2020-09-27 DIAGNOSIS — G20 Parkinson's disease: Secondary | ICD-10-CM

## 2020-10-07 ENCOUNTER — Ambulatory Visit
Admission: RE | Admit: 2020-10-07 | Discharge: 2020-10-07 | Disposition: A | Discharge status: Home / Self Care | Payer: No Typology Code available for payment source | Source: Ambulatory Visit | Attending: Neurology | Admitting: Neurology

## 2020-10-07 ENCOUNTER — Other Ambulatory Visit: Payer: Self-pay

## 2020-10-07 DIAGNOSIS — G20 Parkinson's disease: Secondary | ICD-10-CM | POA: Insufficient documentation

## 2020-10-07 DIAGNOSIS — R251 Tremor, unspecified: Secondary | ICD-10-CM | POA: Diagnosis not present

## 2021-08-16 ENCOUNTER — Emergency Department: Payer: No Typology Code available for payment source

## 2021-08-16 ENCOUNTER — Other Ambulatory Visit: Payer: Self-pay

## 2021-08-16 ENCOUNTER — Encounter: Payer: Self-pay | Admitting: Emergency Medicine

## 2021-08-16 ENCOUNTER — Inpatient Hospital Stay
Admission: EM | Admit: 2021-08-16 | Discharge: 2021-08-19 | DRG: 065 | Disposition: A | Discharge status: Rehabilitation Facility | Payer: No Typology Code available for payment source | Attending: Internal Medicine | Admitting: Internal Medicine

## 2021-08-16 DIAGNOSIS — Z933 Colostomy status: Secondary | ICD-10-CM

## 2021-08-16 DIAGNOSIS — G629 Polyneuropathy, unspecified: Secondary | ICD-10-CM | POA: Diagnosis present

## 2021-08-16 DIAGNOSIS — Z8249 Family history of ischemic heart disease and other diseases of the circulatory system: Secondary | ICD-10-CM

## 2021-08-16 DIAGNOSIS — I639 Cerebral infarction, unspecified: Secondary | ICD-10-CM | POA: Diagnosis not present

## 2021-08-16 DIAGNOSIS — Z823 Family history of stroke: Secondary | ICD-10-CM

## 2021-08-16 DIAGNOSIS — G20 Parkinson's disease: Secondary | ICD-10-CM | POA: Diagnosis present

## 2021-08-16 DIAGNOSIS — E785 Hyperlipidemia, unspecified: Secondary | ICD-10-CM | POA: Diagnosis present

## 2021-08-16 DIAGNOSIS — Z881 Allergy status to other antibiotic agents status: Secondary | ICD-10-CM

## 2021-08-16 DIAGNOSIS — N1832 Chronic kidney disease, stage 3b: Secondary | ICD-10-CM | POA: Diagnosis present

## 2021-08-16 DIAGNOSIS — W1830XA Fall on same level, unspecified, initial encounter: Secondary | ICD-10-CM | POA: Diagnosis present

## 2021-08-16 DIAGNOSIS — R29701 NIHSS score 1: Secondary | ICD-10-CM | POA: Diagnosis present

## 2021-08-16 DIAGNOSIS — I129 Hypertensive chronic kidney disease with stage 1 through stage 4 chronic kidney disease, or unspecified chronic kidney disease: Secondary | ICD-10-CM | POA: Diagnosis present

## 2021-08-16 DIAGNOSIS — Z20822 Contact with and (suspected) exposure to covid-19: Secondary | ICD-10-CM | POA: Diagnosis present

## 2021-08-16 DIAGNOSIS — I69351 Hemiplegia and hemiparesis following cerebral infarction affecting right dominant side: Secondary | ICD-10-CM

## 2021-08-16 DIAGNOSIS — I6381 Other cerebral infarction due to occlusion or stenosis of small artery: Secondary | ICD-10-CM | POA: Diagnosis not present

## 2021-08-16 DIAGNOSIS — Z880 Allergy status to penicillin: Secondary | ICD-10-CM

## 2021-08-16 DIAGNOSIS — I440 Atrioventricular block, first degree: Secondary | ICD-10-CM | POA: Diagnosis present

## 2021-08-16 DIAGNOSIS — W19XXXA Unspecified fall, initial encounter: Secondary | ICD-10-CM

## 2021-08-16 DIAGNOSIS — I10 Essential (primary) hypertension: Secondary | ICD-10-CM

## 2021-08-16 DIAGNOSIS — S40011A Contusion of right shoulder, initial encounter: Secondary | ICD-10-CM | POA: Diagnosis present

## 2021-08-16 DIAGNOSIS — E1122 Type 2 diabetes mellitus with diabetic chronic kidney disease: Secondary | ICD-10-CM | POA: Diagnosis present

## 2021-08-16 DIAGNOSIS — Z85048 Personal history of other malignant neoplasm of rectum, rectosigmoid junction, and anus: Secondary | ICD-10-CM

## 2021-08-16 HISTORY — DX: Contact with and (suspected) exposure to other hazardous, chiefly nonmedicinal, chemicals: Z77.098

## 2021-08-16 HISTORY — DX: Contact with and (suspected) exposure to other war theater: Z77.39

## 2021-08-16 HISTORY — DX: Polyneuropathy, unspecified: G62.9

## 2021-08-16 LAB — COMPREHENSIVE METABOLIC PANEL
ALT: 5 U/L (ref 0–44)
AST: 19 U/L (ref 15–41)
Albumin: 3.6 g/dL (ref 3.5–5.0)
Alkaline Phosphatase: 63 U/L (ref 38–126)
Anion gap: 7 (ref 5–15)
BUN: 32 mg/dL — ABNORMAL HIGH (ref 8–23)
CO2: 24 mmol/L (ref 22–32)
Calcium: 8.7 mg/dL — ABNORMAL LOW (ref 8.9–10.3)
Chloride: 107 mmol/L (ref 98–111)
Creatinine, Ser: 1.95 mg/dL — ABNORMAL HIGH (ref 0.61–1.24)
GFR, Estimated: 36 mL/min — ABNORMAL LOW (ref >60)
Glucose, Bld: 144 mg/dL — ABNORMAL HIGH (ref 70–99)
Potassium: 3.7 mmol/L (ref 3.5–5.1)
Sodium: 138 mmol/L (ref 135–145)
Total Bilirubin: 1 mg/dL (ref 0.3–1.2)
Total Protein: 7.3 g/dL (ref 6.5–8.1)

## 2021-08-16 LAB — CBC
HCT: 35.3 % — ABNORMAL LOW (ref 39.0–52.0)
Hemoglobin: 11.4 g/dL — ABNORMAL LOW (ref 13.0–17.0)
MCH: 27.8 pg (ref 26.0–34.0)
MCHC: 32.3 g/dL (ref 30.0–36.0)
MCV: 86.1 fL (ref 80.0–100.0)
Platelets: 220 10*3/uL (ref 150–400)
RBC: 4.1 MIL/uL — ABNORMAL LOW (ref 4.22–5.81)
RDW: 13.4 % (ref 11.5–15.5)
WBC: 9 10*3/uL (ref 4.0–10.5)
nRBC: 0 % (ref 0.0–0.2)

## 2021-08-16 LAB — URINALYSIS, ROUTINE W REFLEX MICROSCOPIC
Bilirubin Urine: NEGATIVE
Glucose, UA: NEGATIVE mg/dL
Ketones, ur: NEGATIVE mg/dL
Nitrite: NEGATIVE
Protein, ur: >=300 mg/dL — AB
Specific Gravity, Urine: 1.017 (ref 1.005–1.030)
WBC, UA: >50 WBC/hpf — ABNORMAL HIGH (ref 0–5)
pH: 5 (ref 5.0–8.0)

## 2021-08-16 LAB — URINE DRUG SCREEN, QUALITATIVE (ARMC ONLY)
Amphetamines, Ur Screen: NOT DETECTED
Barbiturates, Ur Screen: NOT DETECTED
Benzodiazepine, Ur Scrn: NOT DETECTED
Cannabinoid 50 Ng, Ur ~~LOC~~: NOT DETECTED
Cocaine Metabolite,Ur ~~LOC~~: NOT DETECTED
MDMA (Ecstasy)Ur Screen: NOT DETECTED
Methadone Scn, Ur: NOT DETECTED
Opiate, Ur Screen: NOT DETECTED
Phencyclidine (PCP) Ur S: NOT DETECTED
Tricyclic, Ur Screen: NOT DETECTED

## 2021-08-16 LAB — DIFFERENTIAL
Abs Immature Granulocytes: 0.03 10*3/uL (ref 0.00–0.07)
Basophils Absolute: 0.1 10*3/uL (ref 0.0–0.1)
Basophils Relative: 1 %
Eosinophils Absolute: 0.1 10*3/uL (ref 0.0–0.5)
Eosinophils Relative: 1 %
Immature Granulocytes: 0 %
Lymphocytes Relative: 18 %
Lymphs Abs: 1.7 10*3/uL (ref 0.7–4.0)
Monocytes Absolute: 0.8 10*3/uL (ref 0.1–1.0)
Monocytes Relative: 9 %
Neutro Abs: 6.4 10*3/uL (ref 1.7–7.7)
Neutrophils Relative %: 71 %

## 2021-08-16 LAB — RESP PANEL BY RT-PCR (FLU A&B, COVID) ARPGX2
Influenza A by PCR: NEGATIVE
Influenza B by PCR: NEGATIVE
SARS Coronavirus 2 by RT PCR: NEGATIVE

## 2021-08-16 LAB — CBG MONITORING, ED: Glucose-Capillary: 144 mg/dL — ABNORMAL HIGH (ref 70–99)

## 2021-08-16 LAB — LIPID PANEL
Cholesterol: 231 mg/dL — ABNORMAL HIGH (ref 0–200)
HDL: 39 mg/dL — ABNORMAL LOW (ref >40)
LDL Cholesterol: 146 mg/dL — ABNORMAL HIGH (ref 0–99)
Total CHOL/HDL Ratio: 5.9 RATIO
Triglycerides: 228 mg/dL — ABNORMAL HIGH (ref <150)
VLDL: 46 mg/dL — ABNORMAL HIGH (ref 0–40)

## 2021-08-16 LAB — ETHANOL: Alcohol, Ethyl (B): <10 mg/dL (ref <10)

## 2021-08-16 LAB — TROPONIN I (HIGH SENSITIVITY)
Troponin I (High Sensitivity): 27 ng/L — ABNORMAL HIGH (ref <18)
Troponin I (High Sensitivity): 29 ng/L — ABNORMAL HIGH (ref <18)

## 2021-08-16 LAB — APTT: aPTT: 33 seconds (ref 24–36)

## 2021-08-16 LAB — PROTIME-INR
INR: 1 (ref 0.8–1.2)
Prothrombin Time: 12.9 seconds (ref 11.4–15.2)

## 2021-08-16 MED ORDER — ATORVASTATIN CALCIUM 20 MG PO TABS
80.0000 mg | ORAL_TABLET | Freq: Every day | ORAL | Status: DC
  Administered 2021-08-16 – 2021-08-19 (×4): 80 mg via ORAL
  Filled 2021-08-16 (×4): qty 4

## 2021-08-16 MED ORDER — ENOXAPARIN SODIUM 40 MG/0.4ML IJ SOSY
40.0000 mg | PREFILLED_SYRINGE | INTRAMUSCULAR | Status: DC
  Administered 2021-08-16 – 2021-08-18 (×3): 40 mg via SUBCUTANEOUS
  Filled 2021-08-16 (×3): qty 0.4

## 2021-08-16 MED ORDER — HYDRALAZINE HCL 20 MG/ML IJ SOLN
10.0000 mg | Freq: Four times a day (QID) | INTRAMUSCULAR | Status: DC | PRN
  Administered 2021-08-16: 10 mg via INTRAVENOUS
  Filled 2021-08-16: qty 1

## 2021-08-16 MED ORDER — CARBIDOPA-LEVODOPA 25-100 MG PO TABS
2.0000 | ORAL_TABLET | Freq: Four times a day (QID) | ORAL | Status: DC
  Administered 2021-08-16 – 2021-08-19 (×11): 2 via ORAL
  Filled 2021-08-16 (×15): qty 2

## 2021-08-16 MED ORDER — ASPIRIN EC 81 MG PO TBEC
81.0000 mg | DELAYED_RELEASE_TABLET | Freq: Every day | ORAL | Status: DC
Start: 2021-08-17 — End: 2021-08-19
  Administered 2021-08-17 – 2021-08-19 (×3): 81 mg via ORAL
  Filled 2021-08-16 (×3): qty 1

## 2021-08-16 MED ORDER — ACETAMINOPHEN 500 MG PO TABS
1000.0000 mg | ORAL_TABLET | Freq: Three times a day (TID) | ORAL | Status: DC | PRN
  Administered 2021-08-18 (×2): 1000 mg via ORAL
  Filled 2021-08-16 (×2): qty 2

## 2021-08-16 MED ORDER — TAMSULOSIN HCL 0.4 MG PO CAPS
0.8000 mg | ORAL_CAPSULE | Freq: Every day | ORAL | Status: DC
  Administered 2021-08-16 – 2021-08-18 (×3): 0.8 mg via ORAL
  Filled 2021-08-16 (×3): qty 2

## 2021-08-16 MED ORDER — ONDANSETRON HCL 4 MG/2ML IJ SOLN: 4.0000 mg | Freq: Four times a day (QID) | INTRAMUSCULAR | Status: DC | PRN

## 2021-08-16 MED ORDER — ONDANSETRON 4 MG PO TBDP: 4.0000 mg | ORAL_TABLET | Freq: Three times a day (TID) | ORAL | Status: DC | PRN

## 2021-08-16 MED ORDER — HYDROCODONE-ACETAMINOPHEN 5-325 MG PO TABS
1.0000 | ORAL_TABLET | Freq: Once | ORAL | Status: AC
  Administered 2021-08-16: 1 via ORAL
  Filled 2021-08-16: qty 1

## 2021-08-16 MED ORDER — CLOPIDOGREL BISULFATE 75 MG PO TABS
300.0000 mg | ORAL_TABLET | Freq: Once | ORAL | Status: AC
  Administered 2021-08-16: 300 mg via ORAL
  Filled 2021-08-16: qty 4

## 2021-08-16 MED ORDER — CLOPIDOGREL BISULFATE 75 MG PO TABS
75.0000 mg | ORAL_TABLET | Freq: Every day | ORAL | Status: DC
  Administered 2021-08-17 – 2021-08-19 (×3): 75 mg via ORAL
  Filled 2021-08-16 (×3): qty 1

## 2021-08-16 MED ORDER — POLYETHYLENE GLYCOL 3350 17 G PO PACK: 17.0000 g | PACK | Freq: Two times a day (BID) | ORAL | Status: DC | PRN

## 2021-08-16 MED ORDER — DOCUSATE SODIUM 100 MG PO CAPS: 100.0000 mg | ORAL_CAPSULE | Freq: Two times a day (BID) | ORAL | Status: DC | PRN

## 2021-08-16 MED ORDER — FINASTERIDE 5 MG PO TABS
5.0000 mg | ORAL_TABLET | Freq: Every day | ORAL | Status: DC
  Administered 2021-08-16 – 2021-08-19 (×4): 5 mg via ORAL
  Filled 2021-08-16 (×4): qty 1

## 2021-08-16 MED ORDER — STROKE: EARLY STAGES OF RECOVERY BOOK: Freq: Once | Status: AC

## 2021-08-16 MED ORDER — ASPIRIN 81 MG PO CHEW
324.0000 mg | CHEWABLE_TABLET | Freq: Once | ORAL | Status: AC
  Administered 2021-08-16: 324 mg via ORAL
  Filled 2021-08-16: qty 4

## 2021-08-16 NOTE — H&P (Signed)
History and Physical    Justin Mann KVQ:[id number] DOB: 02-26-1946 DOA: 08/16/2021  [facility]P: [facility] [place]  Patient coming from: home  I have personally briefly reviewed patient's old medical records in [place]  Chief Complaint: right arm and leg weakness  HPI: Justin Mann is a 76 y.o. male with medical history significant of Parkinson's, Hx of colon cancer s/p colostomy, HTN, CKD3b who presented with acute onset right-sided arm and leg weakness.   Pt went to bed around 19:30 last night, and woke up around 21:00 and found weakness in his right arm and leg, and couldn't walk.  Pt complained of right shoulder pain and said he may have fallen on it.  At baseline, pt uses a walker at night, and does fall sometimes, but usually just rests a bit and gets up.  Pt as Parkinson and neuropathy that affect his mobility.  Prior to the acute onset weakness, pt was at his baseline.    Pt and wife reported elevated BP to 160's at home while on home amlodipine and Lisinopril.  Pt does take a statin.  Pt had DM in the past, but became controlled after loosing weight from colon cancer.  No fever, dyspnea, chest pain, abdominal pain, N/V/D, dysuria.    ED Course: initial vitals: afebrile, pulse 67, BP 199/95, sating 95% on room air.  Labs notable for Cr 1.95 (baseline).  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  Pt had neuro tele consult in the ED.  Pt was out of window for thrombolytics.  Pt received ASA 324 mg in the ED and was admitted for full stroke workup.   Assessment/Plan Principal Problem:   Stroke (cerebrum) ([place])  # Acute stroke --pt presented with right arm and leg weakness.  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   --MRA head/neck without significant stenosis.  LDL 146.  A1c pending. Plan: --Neuro consult, with Dr. Leonel Ramsay --Hold home BP meds and  allow permissive hypertension to 220/120 until tomorrow. --Echo --increase statin to Lipitor 80 mg daily --Monitor on tele for Afib --PT/OT --further rec per neuro  # HTN --BP 199/95 on presentation.  Pt was on amlodipine and Lisinopril PTA and noted BP elevated at home around 160's. Plan: --Hold home BP meds and allow permissive hypertension to 220/120 until tomorrow. --Need BP management for further stroke risk reduction.  # HLD --LDL 146 while on home Lipitor 5 mg daily --increase statin to Lipitor 80 mg daily  # Parkinson --cont home Sinemet  # Hx of colon cancer s/p colostomy --routine ostomy care  # Hx of DM2 --not currently on hypoglycemics.  Pt reported his DM got better after loosing weight --f/u A1c  # CKD 3b --stable   DVT prophylaxis: Lovenox SQ Code Status: Full code  Family Communication: wife updated at bedside on admission  Disposition Plan: to be determined  Consults called: Neuro Level of care: Med-Surg   Review of Systems: As per HPI otherwise complete review of systems negative.   Past Medical History:  Diagnosis Date   Diabetes mellitus without complication ([place])    Foley catheter in place    Hypertension    Rectal cancer [facility])     Past Surgical History:  Procedure Laterality Date   ILEOSTOMY     TONSILLECTOMY       reports that he has never smoked. He does not  have any smokeless tobacco history on file. He reports current alcohol use. No history on file for drug use.  Allergies  Allergen Reactions   Penicillins Hives   Erythromycin Rash    Family History  Problem Relation Age of Onset   Hypertension Father    Heart attack Father    Stroke Sister    Hypertension Sister      Prior to Admission medications   Not on File    Physical Exam: Vitals:   08/16/21 1400 08/16/21 1600 08/16/21 1645 08/16/21 1942  BP: (!) 164/82 (!) 174/88 (!) 185/107 (!) 190/92  Pulse: 72 70 97 69  Resp: (!) 21 19 18 20   Temp:   97.9 F (36.6  C) 98.5 F (36.9 C)  TempSrc:   Axillary Oral  SpO2: 97% 94% 97% 98%  Weight:      Height:        Constitutional: NAD, AAOx3 HEENT: conjunctivae and lids normal, EOMI CV: No cyanosis.   RESP: normal respiratory effort, on RA GI: colostomy bag with brown soft stool Extremities: No effusions, edema in BLE SKIN: warm, dry Neuro: II - XII grossly intact.   Psych: Normal mood and affect.  Appropriate judgement and reason   Labs on Admission: I have personally reviewed following labs and imaging studies  CBC: Recent Labs  Lab 08/16/21 0024  WBC 9.0  NEUTROABS 6.4  HGB 11.4*  HCT 35.3*  MCV 86.1  PLT 643   Basic Metabolic Panel: Recent Labs  Lab 08/16/21 0024  NA 138  K 3.7  CL 107  CO2 24  GLUCOSE 144*  BUN 32*  CREATININE 1.95*  CALCIUM 8.7*   GFR: Estimated Creatinine Clearance: 38.2 mL/min (A) (by C-G formula based on SCr of 1.95 mg/dL (H)). Liver Function Tests: Recent Labs  Lab 08/16/21 0024  AST 19  ALT 5  ALKPHOS 63  BILITOT 1.0  PROT 7.3  ALBUMIN 3.6   No results for input(s): LIPASE, AMYLASE in the last 168 hours. No results for input(s): AMMONIA in the last 168 hours. Coagulation Profile: Recent Labs  Lab 08/16/21 0024  INR 1.0   Cardiac Enzymes: No results for input(s): CKTOTAL, CKMB, CKMBINDEX, TROPONINI in the last 168 hours. BNP (last 3 results) No results for input(s): PROBNP in the last 8760 hours. HbA1C: No results for input(s): HGBA1C in the last 72 hours. CBG: Recent Labs  Lab 08/16/21 0022  GLUCAP 144*   Lipid Profile: Recent Labs    08/16/21 0024  CHOL 231*  HDL 39*  LDLCALC 146*  TRIG 228*  CHOLHDL 5.9   Thyroid Function Tests: No results for input(s): TSH, T4TOTAL, FREET4, T3FREE, THYROIDAB in the last 72 hours. Anemia Panel: No results for input(s): VITAMINB12, FOLATE, FERRITIN, TIBC, IRON, RETICCTPCT in the last 72 hours. Urine analysis:    Component Value Date/Time   COLORURINE YELLOW (A) 08/16/2021  0612   APPEARANCEUR CLOUDY (A) 08/16/2021 0612   LABSPEC 1.017 08/16/2021 0612   PHURINE 5.0 08/16/2021 0612   GLUCOSEU NEGATIVE 08/16/2021 0612   HGBUR SMALL (A) 08/16/2021 0612   BILIRUBINUR NEGATIVE 08/16/2021 0612   KETONESUR NEGATIVE 08/16/2021 0612   PROTEINUR >=300 (A) 08/16/2021 0612   NITRITE NEGATIVE 08/16/2021 0612   LEUKOCYTESUR SMALL (A) 08/16/2021 0612    Radiological Exams on Admission: DG Shoulder Right  Result Date: 08/16/2021 CLINICAL DATA:  Fall with right shoulder pain EXAM: RIGHT SHOULDER - 3 IEW COMPARISON:  None. FINDINGS: There is no evidence of fracture or dislocation. There  is no evidence of arthropathy or other focal bone abnormality. Soft tissues are unremarkable. Calcified right pulmonary nodules. IMPRESSION: Negative for fracture or dislocation. Electronically Signed   By: Jorje Guild M.D.   On: 08/16/2021 05:31   CT HEAD WO CONTRAST  Result Date: 08/16/2021 CLINICAL DATA:  Neuro deficit, acute, stroke suspected right arm and leg weakness EXAM: CT HEAD WITHOUT CONTRAST TECHNIQUE: Contiguous axial images were obtained from the base of the skull through the vertex without intravenous contrast. RADIATION DOSE REDUCTION: This exam was performed according to the departmental dose-optimization program which includes automated exposure control, adjustment of the mA and/or kV according to patient size and/or use of iterative reconstruction technique. COMPARISON:  Brain MRI 10/07/2020 FINDINGS: Brain: No acute intracranial hemorrhage. Small recurrent felt to in the right thalamus, not definitively seen on prior MRI. No evidence of territorial or large vessel ischemia. Stable brain volume with atrophy and ventriculomegaly. Stable periventricular and deep chronic small vessel ischemia, allowing for differences in modality. No midline shift or mass effect. No subdural or extra-axial collection. Vascular: Atherosclerosis of skullbase vasculature without hyperdense vessel or  abnormal calcification. Skull: No fracture or focal lesion. Sinuses/Orbits: Occasional mucosal thickening of ethmoid air cells. Bilateral lens extraction. Mastoid air cells are clear. Other: None. IMPRESSION: 1. Small lacunar infarct in the right thalamus, age indeterminate, but not definitively seen on prior MRI. 2. Stable atrophy and chronic small vessel ischemia. Electronically Signed   By: Keith Rake M.D.   On: 08/16/2021 00:52   CT Cervical Spine Wo Contrast  Result Date: 08/16/2021 CLINICAL DATA:  76 year old male with history of neck trauma. EXAM: CT CERVICAL SPINE WITHOUT CONTRAST TECHNIQUE: Multidetector CT imaging of the cervical spine was performed without intravenous contrast. Multiplanar CT image reconstructions were also generated. RADIATION DOSE REDUCTION: This exam was performed according to the departmental dose-optimization program which includes automated exposure control, adjustment of the mA and/or kV according to patient size and/or use of iterative reconstruction technique. COMPARISON:  No priors. FINDINGS: Alignment: Normal. Skull base and vertebrae: No acute fracture. No primary bone lesion or focal pathologic process. Soft tissues and spinal canal: No prevertebral fluid or swelling. No visible canal hematoma. Disc levels: Multilevel degenerative disc disease, most evident at C5-C6 and C6-C7. Mild multilevel facet arthropathy. Upper chest: Unremarkable. Other: None. IMPRESSION: 1. No evidence of significant acute traumatic injury to the cervical spine. 2. Mild multilevel degenerative disc disease and cervical spondylosis, as above. Electronically Signed   By: Vinnie Langton M.D.   On: 08/16/2021 06:12   MR ANGIO HEAD WO CONTRAST  Result Date: 08/16/2021 CLINICAL DATA:  Neuro deficit with acute stroke suspected EXAM: MRA HEAD WITHOUT CONTRAST TECHNIQUE: Angiographic images of the Circle of Willis were acquired using MRA technique without intravenous contrast. COMPARISON:   Brain MRI from earlier the same day FINDINGS: Anterior circulation: Typical of ICA artifact appearance at the skull base. No branch occlusion, beading, or aneurysm. No significant stenosis. Posterior circulation: The basilar and covered vertebral arteries are smoothly contoured and widely patent. No branch occlusion, beading, or aneurysm. IMPRESSION: Negative intracranial MRA. Electronically Signed   By: Jorje Guild M.D.   On: 08/16/2021 06:02   MR ANGIO NECK WO CONTRAST  Result Date: 08/16/2021 CLINICAL DATA:  Neuro deficit with acute stroke suspected EXAM: MRA NECK WITHOUT CONTRAST TECHNIQUE: Angiographic images of the neck were acquired using MRA technique without intravenous contrast. Carotid stenosis measurements (when applicable) are obtained utilizing NASCET criteria, using the distal internal carotid diameter as  the denominator. COMPARISON:  Brain MRI from earlier today FINDINGS: Aortic arch: Partial coverage is negative. Right carotid system: Mild flow artifact at the proximal ICA. No flow reducing stenosis or visible dissection. Left carotid system: Vessel tortuosity. No stenosis or worrisome vessel irregularity. Vertebral arteries: No proximal subclavian stenosis. Codominant vertebral arteries which are smoothly contoured and widely patent to the dura. IMPRESSION: Unremarkable major vessels in the neck. No stenosis or embolic source seen. Electronically Signed   By: Jorje Guild M.D.   On: 08/16/2021 06:00   MR BRAIN WO CONTRAST  Result Date: 08/16/2021 CLINICAL DATA:  Stroke suspected, right arm and leg weakness EXAM: MRI HEAD WITHOUT CONTRAST TECHNIQUE: Multiplanar, multiecho pulse sequences of the brain and surrounding structures were obtained without intravenous contrast. COMPARISON:  10/07/2020 MRI, correlation is also made with 08/16/2021 CT FINDINGS: Brain: Small area of restricted diffusion with ADC correlate in the left parietal corona radiata and subcortical white matter (series  5, images 37-40 and series 6, images 37-40). No acute hemorrhage, mass mass effect, or midline shift. No hydrocephalus or extra-axial collection. Lacunar infarcts in the left pons, bilateral basal ganglia, and left corona radiata. Confluent T2 hyperintense signal in the periventricular white matter and pons, likely the sequela of moderate chronic small vessel ischemic disease. Central cerebral atrophy is somewhat advanced for age. Vascular: Normal flow voids. Skull and upper cervical spine: Normal marrow signal. Sinuses/Orbits: Negative.  Status post bilateral lens replacements. Other: None. IMPRESSION: Small acute infarcts in the left parietal corona radiata and subcortical white matter. Electronically Signed   By: Merilyn Baba M.D.   On: 08/16/2021 02:29      Enzo Bi MD [facility]  If 7PM-7AM, please contact night-coverage 08/16/2021, 7:54 PM

## 2021-08-16 NOTE — ED Notes (Signed)
PT working with pt.

## 2021-08-16 NOTE — ED Triage Notes (Signed)
Pt to ED via EMS from home c/o right arm and leg weakness that started at 2100 tonight.  States went to bed around 1930 fine, woke up and when he tried to go to the bathroom his right leg wouldn't support him.  States similar episodes in the past that have gone away, EMS stroke screen negative 95% RA, 144 CBG.  Pt A&Ox4 in triage, denies numbness or tingling, no facial droop, following commands, equal strong grips.  Denies blood thinners.

## 2021-08-16 NOTE — ED Notes (Signed)
Sent med message for carbidopa/levodopa, to be sent from pharmacy.

## 2021-08-16 NOTE — ED Notes (Signed)
Secure msg sent to Delphina Cahill, RN for ED to [employer].

## 2021-08-16 NOTE — ED Notes (Signed)
Breakfast tray provided to pt.

## 2021-08-16 NOTE — Evaluation (Signed)
Occupational Therapy Evaluation Patient Details Name: Justin Mann MRN: [id number] DOB: [birth year] Today's Date: 08/16/2021   History of Present Illness Pt is a 76 y.o. male who presents to the ED after reporting he became acutely weak around 9:30pm on 2/20, causing him to fall. 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 PmHx: Parkinson's Disease, DM, HTN, LE Neuropathy, colostomy bag.   Clinical Impression   Justin Mann was seen for OT evaluation this date. Prior to hospital admission, pt was MOD I for mobility and ADLs using 4WW for night time mobility and kneeling at toilet for colostomy bag. Spouse at bedside endorses ~5 falls in last 3 months. Pt lives at home c spouse available 24/7, 4 STE. Pt presents to acute OT demonstrating impaired ADL performance and functional mobility 2/2 decreased activity tolerance, poor safety awareness, and functional balance deficits. Pt currently requires MIN A don/doff gown sitting EOB - assist for balance. Tolerates ~8 min static sitting with R lateral lean, unable to self correct as pt fatigues. MOD A + RW sit<>stand and ~3 steps along EOB - assist to weight shift for steps. Pt would benefit from skilled OT to address noted impairments and functional limitations (see below for any additional details). Upon hospital discharge, recommend AIR to maximize pt safety and return to PLOF.   SUPINE: BP 159/88, MAP 108 HR 83 SITTING: BP 139/108, MAP 116, HR 88 STANDING: BP 114/95, MAP 103, HR 86 + endorses dizziness   Recommendations for follow up therapy are one component of a multi-disciplinary discharge planning process, led by the attending physician.  Recommendations may be updated based on patient status, additional functional criteria and insurance authorization.   Follow Up Recommendations  Acute inpatient rehab (3hours/day)    Assistance Recommended at Discharge Frequent or constant Supervision/Assistance  Patient can return home with  the following Two people to help with walking and/or transfers;Two people to help with bathing/dressing/bathroom;Help with stairs or ramp for entrance;Assistance with cooking/housework    Functional Status Assessment  Patient has had a recent decline in their functional status and demonstrates the ability to make significant improvements in function in a reasonable and predictable amount of time.  Equipment Recommendations  BSC/3in1    Recommendations for Other Services Rehab consult     Precautions / Restrictions Precautions Precautions: Fall Restrictions Weight Bearing Restrictions: No      Mobility Bed Mobility Overal bed mobility: Needs Assistance Bed Mobility: Supine to Sit, Sit to Supine     Supine to sit: Min assist Sit to supine: Min guard        Transfers Overall transfer level: Needs assistance Equipment used: Rolling walker (2 wheels) Transfers: Sit to/from Stand Sit to Stand: Min assist                  Balance Overall balance assessment: Needs assistance Sitting-balance support: Feet supported, Bilateral upper extremity supported Sitting balance-Leahy Scale: Poor   Postural control: Right lateral lean Standing balance support: Reliant on assistive device for balance, During functional activity, Bilateral upper extremity supported Standing balance-Leahy Scale: Poor Standing balance comment: heavy R lateral lean                           ADL either performed or assessed with clinical judgement   ADL Overall ADL's : Needs assistance/impaired  General ADL Comments: MIN A don/doff gown sitting EOB - assist for balance. MOD A + RW for simulated BSC t/f - heavy R lateral lean, assist to weight shift for steps      Pertinent Vitals/Pain Pain Assessment Pain Assessment: Faces Faces Pain Scale: Hurts little more Pain Location: R shoulder Pain Descriptors / Indicators: Discomfort,  Grimacing, Dull Pain Intervention(s): Limited activity within patient's tolerance, Patient requesting pain meds-RN notified     Hand Dominance Right   Extremity/Trunk Assessment Upper Extremity Assessment Upper Extremity Assessment: RUE deficits/detail RUE Deficits / Details: 4-/5 grossly RUE Sensation: WNL RUE Coordination: decreased gross motor   Lower Extremity Assessment Lower Extremity Assessment: Defer to PT evaluation RLE Deficits / Details: RLE 3/5 for hip flexion, RLE 4-/5 for knee flexion/extension. RLE Sensation: history of peripheral neuropathy RLE Coordination: decreased gross motor LLE Deficits / Details: LLE 4/5 grossly LLE Sensation: history of peripheral neuropathy       Communication Communication Communication: No difficulties   Cognition Arousal/Alertness: Awake/alert Behavior During Therapy: WFL for tasks assessed/performed Overall Cognitive Status: Within Functional Limits for tasks assessed                                       General Comments  SUPINE: BP 159/88, MAP 108 HR 83. SITTING: BP 139/108, MAP 116, HR 88. STANDING: BP 114/95, MAP 103, HR 86            Home Living Family/patient expects to be discharged to:: Private residence Living Arrangements: Spouse/significant other Available Help at Discharge: Family;Available 24 hours/day Type of Home: House Home Access: Stairs to enter [employer] of Steps: 4 Entrance Stairs-Rails: Can reach both Home Layout: One level (down 2 small steps into den then up to [employer])     Bathroom Shower/Tub: Tub/shower unit         Home Equipment: Shower seat;Grab bars - tub/shower;Rollator (4 wheels)          Prior Functioning/Environment Prior Level of Function : Independent/Modified Independent             Mobility Comments: [id number] for nightime mobility. Pt endorses ~5 falls in last 3 months, states r/t Parkinsons ADLs Comments: kneels in front of toilet to empty  colostomy bag. Baseline Parkinsons tremors intermittently affect ADLs        OT Problem List: Decreased strength;Decreased range of motion;Decreased activity tolerance;Impaired balance (sitting and/or standing);Decreased safety awareness;Impaired UE functional use      OT Treatment/Interventions: Self-care/ADL training;Therapeutic exercise;Energy conservation;DME and/or AE instruction;Therapeutic activities;Patient/family education;Balance training    OT Goals(Current goals can be found in the care plan section) Acute Rehab OT Goals Patient Stated Goal: to return to PLOF OT Goal Formulation: With patient/family Time For Goal Achievement: 08/30/21 Potential to Achieve Goals: Good ADL Goals Pt Will Perform Grooming: with set-up;with supervision;standing Pt Will Perform Upper Body Dressing: with modified independence;sitting Pt Will Perform Lower Body Dressing: with min assist;sit to/from stand Pt Will Transfer to Toilet: with min guard assist;ambulating;bedside commode  OT Frequency: Min 5X/week       AM-PAC OT "6 Clicks" Daily Activity     Outcome Measure Help from another person eating meals?: A Little Help from another person taking care of personal grooming?: A Lot Help from another person toileting, which includes using toliet, bedpan, or urinal?: A Lot Help from another person bathing (including washing, rinsing, drying)?: A Lot Help from another person to  put on and taking off regular upper body clothing?: A Little Help from another person to put on and taking off regular lower body clothing?: A Lot 6 Click Score: 14   End of Session Equipment Utilized During Treatment: Rolling walker (2 wheels);Gait belt Nurse Communication: Mobility status  Activity Tolerance: Patient tolerated treatment well Patient left: in bed;with call bell/phone within reach;with family/visitor present  OT Visit Diagnosis: Other abnormalities of gait and mobility (R26.89);Muscle weakness  (generalized) (M62.81)                Time: 1610-9604 OT Time Calculation (min): 38 min Charges:  OT General Charges $OT Visit: 1 Visit OT Evaluation $OT Eval Moderate Complexity: 1 Mod OT Treatments $Self Care/Home Management : 23-37 mins  Dessie Coma, M.S. OTR/L  08/16/21, 2:45 PM  ascom [phone]

## 2021-08-16 NOTE — ED Provider Notes (Signed)
[facility] Provider Note    Event Date/Time   First MD Initiated Contact with Patient 08/16/21 [phone]     (approximate)   History   Weakness   HPI  Justin Mann is a 76 y.o. male with a past medical history of Parkinson's disease, diabetes and hypertension who presents coming by wife for evaluation after a fall and some right-sided weakness.  Patient states he usually uses a walker due to some neuropathy in his legs and baseline stability from his Parkinson's but felt that he became acutely weak around 9:30 PM yesterday causing him to fall.  He is not sure if he hit his head or not but is sure he get the right shoulder as it is where he primarily has pain right now.  He denies any history of stroke and denies any new weakness or pain in the left extremities.  No pain otherwise in the right extremities other than at the right shoulder.  He denies any neck or back pain.  No chest pain or abdominal pain.  No other recent sick symptoms including fevers, chills, cough, nausea, vomiting, diarrhea, rash or other recent falls or injuries.  He denies being on any blood thinners.  No other concerns at this time.      Physical Exam  Triage Vital Signs: ED Triage Vitals  Enc Vitals Group     BP 08/16/21 0021 (!) 199/95     Pulse Rate 08/16/21 0021 67     Resp 08/16/21 0021 16     Temp 08/16/21 0021 98.7 F (37.1 C)     Temp Source 08/16/21 0021 Oral     SpO2 08/16/21 0021 95 %     Weight 08/16/21 0020 200 lb (90.7 kg)     Height 08/16/21 0020 5\' 10"  (1.778 m)     Head Circumference --      Peak Flow --      Pain Score 08/16/21 0020 0     Pain Loc --      Pain Edu? --      Excl. in [place]? --     Most recent vital signs: Vitals:   08/16/21 0243 08/16/21 0611  BP: (!) 177/99 (!) 193/112  Pulse: 75 89  Resp: 16 18  Temp:    SpO2: 97% 98%    General: Awake, no distress.  CV:  Good peripheral perfusion.  2+ radial pulse.  No significant murmur. Resp:  Normal  effort.  Clear bilaterally. Abd:  No distention.  Soft throughout Other:  Patient is weak on the right upper extremity on flexion extension as well as in the right lower extremity compared to the left.  No significant pronator drift or finger dysmetria.  Cranial nerves II through XII are grossly intact.  Patient has a tremor in all extremities.  There is no significant effusion over the right shoulder but there is some tenderness   ED Results / Procedures / Treatments  Labs (all labs ordered are listed, but only abnormal results are displayed) Labs Reviewed  CBC - Abnormal; Notable for the following components:      Result Value   RBC 4.10 (*)    Hemoglobin 11.4 (*)    HCT 35.3 (*)    All other components within normal limits  COMPREHENSIVE METABOLIC PANEL - Abnormal; Notable for the following components:   Glucose, Bld 144 (*)    BUN 32 (*)    Creatinine, Ser 1.95 (*)    Calcium 8.7 (*)  GFR, Estimated 36 (*)    All other components within normal limits  URINALYSIS, ROUTINE W REFLEX MICROSCOPIC - Abnormal; Notable for the following components:   Color, Urine YELLOW (*)    APPearance CLOUDY (*)    Hgb urine dipstick SMALL (*)    Protein, ur >=300 (*)    Leukocytes,Ua SMALL (*)    WBC, UA >50 (*)    Bacteria, UA MANY (*)    All other components within normal limits  LIPID PANEL - Abnormal; Notable for the following components:   Cholesterol 231 (*)    Triglycerides 228 (*)    HDL 39 (*)    VLDL 46 (*)    LDL Cholesterol 146 (*)    All other components within normal limits  CBG MONITORING, ED - Abnormal; Notable for the following components:   Glucose-Capillary 144 (*)    All other components within normal limits  TROPONIN I (HIGH SENSITIVITY) - Abnormal; Notable for the following components:   Troponin I (High Sensitivity) 27 (*)    All other components within normal limits  TROPONIN I (HIGH SENSITIVITY) - Abnormal; Notable for the following components:   Troponin I  (High Sensitivity) 29 (*)    All other components within normal limits  RESP PANEL BY RT-PCR (FLU A&B, COVID) ARPGX2  PROTIME-INR  APTT  DIFFERENTIAL  ETHANOL  URINE DRUG SCREEN, QUALITATIVE ([facility] ONLY)  HEMOGLOBIN A1C  CBG MONITORING, ED  TROPONIN I (HIGH SENSITIVITY)     EKG  ECGs remarkable sinus rhythm with first-degree AV block withat 244, left axis deviation, right bundle branch block with some nonspecific ST changes versus artifact and lateral leads I and aVL as well as inferior leads II without other clearance of acute ischemia or significant arrhythmia.   RADIOLOGY  CT head on my interpretation shows no skull fracture, intracranial hemorrhage or large ischemic area that appears acute.  I agree with radiology interpretation that shows a small age-indeterminate lacunar infarct and stable atrophy and chronic small vessel ischemia.  CT C-spine on my interpretation there is no acute fracture or subluxation.  I also reviewed radiology's findings and agree with some degenerative changes and spondylosis.  MRI brain interpreted by myself shows a acute left-sided parietal and possibly subcortical infarct.  As reviewed radiology's findings and agree with same.  On my review of MRA head and neck I do not see any large vessel occlusion or stenosis.  I also reviewed radiologist interpretation and agree with her findings of no acute occlusion or large stenosis.  X-ray of the right shoulder on my interpretation shows no acute fracture or dislocation.  I also reviewed radiology interpretation and agree with their findings of no acute process.    PROCEDURES:  Critical Care performed: No  .1-3 Lead EKG Interpretation Performed by: Lucrezia Starch, MD Authorized by: Lucrezia Starch, MD     Interpretation: non-specific     ECG rate assessment: normal     Rhythm: sinus rhythm     Ectopy: none     Conduction: abnormal     Abnormal conduction: 1st degree AV block    The patient is  on the cardiac monitor to evaluate for evidence of arrhythmia and/or significant heart rate changes.   MEDICATIONS ORDERED IN ED: Medications  aspirin chewable tablet 324 mg (324 mg Oral Given 08/16/21 0456)  HYDROcodone-acetaminophen (NORCO/VICODIN) 5-325 MG per tablet 1 tablet (1 tablet Oral Given 08/16/21 0622)     IMPRESSION / MDM / ASSESSMENT AND PLAN /  ED COURSE  I reviewed the triage vital signs and the nursing notes.                              Differential diagnosis includes, but is not limited to fall secondary to CVA, tremor from Parkinson's, ACS, arrhythmia possible sustained occult head or C-spine injury.  Patient has a soreness right shoulder and some tenderness and obtain x-ray to rule out occult shoulder orthopedic injury such as a small fracture.  He is otherwise neurovascular intact and evident right-sided deficits does not seem to have any other clear focal deficits.  On my assessment it has been over 6 and half hours since he first noticed these changes and do not believe she is a candidate for tPA.  ECGs remarkable sinus rhythm with first-degree AV block withat 244, left axis deviation, right bundle branch block with some nonspecific ST changes versus artifact and lateral leads I and aVL as well as inferior leads II without other clearance of acute ischemia or significant arrhythmia.  CT head on my interpretation shows no skull fracture, intracranial hemorrhage or large ischemic area that appears acute.  I agree with radiology interpretation that shows a small age-indeterminate lacunar infarct and stable atrophy and chronic small vessel ischemia.  CT C-spine on my interpretation there is no acute fracture or subluxation.  I also reviewed radiology's findings and agree with some degenerative changes and spondylosis.  MRI brain interpreted by myself shows a acute left-sided parietal and possibly subcortical infarct.  As reviewed radiology's findings and agree with same.  On  my review of MRA head and neck I do not see any large vessel occlusion or stenosis.  I also reviewed radiologist interpretation and agree with her findings of no acute occlusion or large stenosis.  X-ray of the right shoulder on my interpretation shows no acute fracture or dislocation.  I also reviewed radiology interpretation and agree with their findings of no acute process.  Given concern for acute CVA I did consult with neurology who recommended ASA and admission for stroke work-up.  Suspect likely contusion of the right shoulder but low suspicion for other significant head or neck injury or other significant vascular injury.  CMP shows creatinine of 1.95 which appears at baseline without any other significant electrolyte or metabolic derangements.  CBC shows no leukocytosis and hemoglobin 11.4 without recent to compare to.  Coagulation studies are unremarkable.  Initial troponin slightly elevated 27 which I suspect represents some demand ischemia in the setting of acute stroke with lower suspicion for an occlusion MI.  COVID influenza PCR negative.  Patient given ASA for concern for acute ischemic stroke.  He will be admitted to medicine service for further evaluation and management.        FINAL CLINICAL IMPRESSION(S) / ED DIAGNOSES   Final diagnoses:  Cerebrovascular accident (CVA), unspecified mechanism ([place])  Fall, initial encounter  Contusion of multiple sites of right shoulder, initial encounter  Hypertension, unspecified type     Rx / DC Orders   ED Discharge Orders     None        Note:  This document was prepared using Dragon voice recognition software and may include unintentional dictation errors.   Lucrezia Starch, MD 08/16/21 [phone]

## 2021-08-16 NOTE — ED Notes (Signed)
Pt brought lunch tray by dietary services.

## 2021-08-16 NOTE — ED Notes (Signed)
Per Dr. Billie Ruddy, keep pt in ER; likely d/c today. Charge RN aware.

## 2021-08-16 NOTE — ED Notes (Signed)
Diet order requested for pt.

## 2021-08-16 NOTE — ED Notes (Signed)
Informed RN bed assigned 

## 2021-08-16 NOTE — ED Notes (Signed)
Pt requesting diet order. Informed attending. Pt passed stroke swallow screen.

## 2021-08-16 NOTE — Consult Note (Signed)
[facility] TeleNeurology Consult Services  Stat Consult  Patient Name:   Justin Mann, Justin Mann Date of Birth:   07-May-1946 Identification Number:   MRN - [id number] Date of Service:   08/16/2021 04:36:47  Diagnosis:       I63.9 - Cerebrovascular accident (CVA), unspecified mechanism ([place])  Impression 58 M, h/o Parkinson's, developed new onset R arm and leg wkness last night. Came to ER for eval. NIHSS 1 for RLE drift. Out of window for thrombolytics.  MRI brain w/o contrast Small acute infarcts in the left parietal corona radiata and subcortical white matter.  He needs adm for formal stroke workup.  PLAN - asa 325 mg po x1 now - MRA head w/o contrast - MRA neck w/ and w/o contrast - start asa 81 mg and lipitor 80 mg daily - TTE w/bubble - check a1c and LDL - monitor on tele for afib - neuro to follow  ----------------------------------------------------------------------------------------------------  Metrics: [facility] Notification Time: 08/16/2021 04:34:03 Stamp Time: 08/16/2021 04:36:47 Callback Response Time: 08/16/2021 04:41:02  ----------------------------------------------------------------------------------------------------  Chief Complaint: R sided wkness  History of Present Illness: Patient is a 76 year old Male. 89 M, h/o Parkinson's who was LKW at 9 PM last night when he developed new onset right arm and leg weakness. Deficits came on suddenly. He fell due to the weakness. Got up this morning with persistent sx. Came to ER for eval. NIHSS 1 for RLE drift. No drift noted in the arms. Pt reports that his wkness has improved but not resolved. No prior history of stroke in the past.   Past Medical History: Parkinson's  No Anticoagulant use  No Antiplatelet use Reviewed EMR for current medications  Allergies:  Reviewed  Social History: Drug Use: No  Family History: There is no family history of premature cerebrovascular disease  pertinent to this consultation  ROS : 14 Points Review of Systems was performed and was negative except mentioned in HPI.  Past Surgical History: There Is No Surgical History Contributory To Todays Visit   Examination: 1A: Level of Consciousness - Alert; keenly responsive + 0 1B: Ask Month and Age - Both Questions Right + 0 1C: Blink Eyes & Squeeze Hands - Performs Both Tasks + 0 2: Test Horizontal Extraocular Movements - Normal + 0 3: Test Visual Fields - No Visual Loss + 0 4: Test Facial Palsy (Use Grimace if Obtunded) - Normal symmetry + 0 5A: Test Left Arm Motor Drift - No Drift for 10 Seconds + 0 5B: Test Right Arm Motor Drift - No Drift for 10 Seconds + 0 6A: Test Left Leg Motor Drift - No Drift for 5 Seconds + 0 6B: Test Right Leg Motor Drift - Drift, but doesn't hit bed + 1 7: Test Limb Ataxia (FNF/Heel-Shin) - No Ataxia + 0 8: Test Sensation - Normal; No sensory loss + 0 9: Test Language/Aphasia - Normal; No aphasia + 0 10: Test Dysarthria - Normal + 0 11: Test Extinction/Inattention - No abnormality + 0  NIHSS Score: 1     Patient / Family was informed the Neurology Consult would occur via TeleHealth consult by way of interactive audio and video telecommunications and consented to receiving care in this manner.  Patient is being evaluated for possible acute neurologic impairment and high probability of imminent or life - threatening deterioration.I spent total of 13 minutes providing care to this patient, including time for face to face visit via telemedicine, review of medical records, imaging studies and discussion of findings with providers,  the patient and / or family.   Dr Burtis Junes   [facility] [phone]  Case [id number]

## 2021-08-16 NOTE — Progress Notes (Signed)
Inpatient Rehab Admissions Coordinator:   Per OT recommendation, patient was screened for CIR candidacy by Shaneal Barasch, MS, CCC-SLP. At this time, Pt. Appears to be a a potential candidate for CIR. I will place   order for rehab consult per protocol for full assessment. Please contact me any with questions.  Krystalyn Kubota, MS, CCC-SLP Rehab Admissions Coordinator  [phone] (celll) [phone] (office)  

## 2021-08-16 NOTE — Consult Note (Signed)
Neurology Consultation Reason for Consult: Stroke Referring Physician: Billie Ruddy, T  CC: Unsteadiness  History is obtained from: Patient  HPI: Justin Mann is a 76 y.o. male who got up to go to the bathroom last night and noticed on his way to the bathroom he was in his normal compromise state of unsteadiness, however on the way back from the bathroom he felt more unsteady than he typically does.  However, I am not certain if it truly started the nerve prior to bed.   LKW: 7:30 PM tpa given?: no, out of window  ROS: A 14 point ROS was performed and is negative except as noted in the HPI.   Past Medical History:  Diagnosis Date   Diabetes mellitus without complication ([place])    Foley catheter in place    Hypertension    Rectal cancer ([place])      History reviewed. No pertinent family history.   Social History:  reports that he has never smoked. He does not have any smokeless tobacco history on file. He reports current alcohol use. No history on file for drug use.   Exam: Current vital signs: BP (!) 167/89    Pulse 90    Temp 98.7 F (37.1 C) (Oral)    Resp 18    Ht 5\' 10"  (1.778 m)    Wt 90.7 kg    SpO2 96%    BMI 28.70 kg/m  Vital signs in last 24 hours: Temp:  [98.7 F (37.1 C)] 98.7 F (37.1 C) (02/21 0021) Pulse Rate:  [67-105] 90 (02/21 1100) Resp:  [16-27] 18 (02/21 1100) BP: (167-199)/(89-114) 167/89 (02/21 1100) SpO2:  [94 %-98 %] 96 % (02/21 1100) Weight:  [90.7 kg] 90.7 kg (02/21 0020)   Physical Exam  Constitutional: Appears well-developed and well-nourished.  Psych: Affect appropriate to situation  Neuro: Mental Status: Patient is awake, alert, oriented to person, place, month, year, and situation. Patient is able to give a clear and coherent history. No signs of aphasia or neglect Cranial Nerves: II: Visual Fields are full. Pupils are equal, round, and reactive to light.   III,IV, VI: EOMI without ptosis or diploplia.  V: Facial sensation is symmetric to  temperature VII: Facial movement is symmetric.  VIII: hearing is intact to voice X: Uvula elevates symmetrically XI: Shoulder shrug is symmetric. XII: tongue is midline without atrophy or fasciculations.  Motor: Bulk is normal. 5/5 strength was present in bilateral upper extremities, he has both resting and postural tremor bilaterally.  Sensory: Sensation is symmetric to light touch and temperature in the arms and legs. 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  I would allow permissive hypertension for the time being, but over the long-term, small vessel risk factor control will be paramount for reducing his long-term stroke risk.  Recommendations: 1) high intensity statin, agree with increasing atorvastatin to 80 mg daily 2) aspirin 81 mg daily and Plavix 75 mg daily for 3 weeks followed by Plavix monotherapy 3) permissive hypertension to 220/120 through tomorrow, then can begin cautiously lowering. 4) echo, telemetry 5) PT, OT, ST 6) continue Sinemet 2 tabs of 25/100 every 4 hours as at home   Roland Rack, MD [facility] [phone]  If 7pm- 7am, please page neurology on call as listed in [place].

## 2021-08-16 NOTE — ED Notes (Signed)
Teleneuro consult in progress 

## 2021-08-16 NOTE — ED Notes (Signed)
MRI notified tele-neuro consult complete.

## 2021-08-16 NOTE — ED Notes (Signed)
Called pharmacy to verify PRN hydralazine for pt. Attending provider wants it given.

## 2021-08-16 NOTE — ED Notes (Signed)
Neuro stat consult called out

## 2021-08-16 NOTE — Evaluation (Addendum)
Physical Therapy Evaluation Patient Details Name: Justin Mann MRN: [id number] DOB: 21-Jan-1946 Today's Date: 08/16/2021  History of Present Illness  Pt is a 76 y.o. male who presents to the ED after reporting he became acutely weak around 9:30pm on 2/20, causing him to fall. 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 PmHx: Parkinson's Disease, DM, HTN, LE Neuropathy, colostomy bag.   Clinical Impression  Pt awake and resting in bed upon PT entrance into room for PT evaluation today. Pt reports having some R shoulder pain s/p fall but does not provide a numerical value. Patient reports he lives w/ spouse in 1-story home and was independent w/ ADLs prior to hospitalization. Pt reports he does use a rollator at home for mobility.   Pt was able to perform bed mobility w/ SUPERVISION. Once seated EOB; Pt exhibited good sitting balance in order to obtain growth strength measurements and coordination. Pt was able to perform sit to stand w/ minA using RW and was able to ambulate ~85ft w/ minA using RW before increase in fatigue and needing to sit down. During ambulation Pt exhibited poor R foot clearance, as well as an even significant decrease in R foot clearance occurred w/ fatigue. Once seated for ~32min, patient was able to ambulate ~30ft to return to bed. Pt will benefit from continued skilled PT in order to improve LE strength, mobility, gait, and restore PLOF. Current discharge recommendation to CIR is appropriate due to the level of assistance required by the patient to ensure safety and improve overall function.      Recommendations for follow up therapy are one component of a multi-disciplinary discharge planning process, led by the attending physician.  Recommendations may be updated based on patient status, additional functional criteria and insurance authorization.  Follow Up Recommendations Acute inpatient rehab (3hours/day)    Assistance Recommended at Discharge  Frequent or constant Supervision/Assistance  Patient can return home with the following  A little help with walking and/or transfers;A little help with bathing/dressing/bathroom;Assistance with cooking/housework;Assist for transportation;Help with stairs or ramp for entrance    Equipment Recommendations Rolling walker (2 wheels)  Recommendations for Other Services       Functional Status Assessment Patient has had a recent decline in their functional status and demonstrates the ability to make significant improvements in function in a reasonable and predictable amount of time.     Precautions / Restrictions Precautions Precautions: Fall Restrictions Weight Bearing Restrictions: No      Mobility  Bed Mobility Overal bed mobility: Needs Assistance Bed Mobility: Supine to Sit     Supine to sit: Supervision          Transfers Overall transfer level: Needs assistance Equipment used: Rolling walker (2 wheels) Transfers: Sit to/from Stand Sit to Stand: Min assist                Ambulation/Gait   Gait Distance (Feet): 25 Feet (38ft needed to sit due to increased fatigue and RLE lag; 29ft to return to bed after sitting for ~9min.) Assistive device: Rolling walker (2 wheels) Gait Pattern/deviations: Step-to pattern, Decreased step length - right, Decreased step length - left, Decreased weight shift to right, Decreased stride length, Trunk flexed Gait velocity: decreased     General Gait Details: R LE step length deficit; with increased fatigue R LE lags behind and decreases BOS.  Stairs            Wheelchair Mobility    Modified Rankin (Stroke Patients Only)  Balance Overall balance assessment: Needs assistance Sitting-balance support: Feet supported, Bilateral upper extremity supported Sitting balance-Leahy Scale: Fair   Postural control: Left lateral lean Standing balance support: Reliant on assistive device for balance, During functional  activity, Bilateral upper extremity supported Standing balance-Leahy Scale: Fair                               Pertinent Vitals/Pain Pain Assessment Pain Assessment: Faces Faces Pain Scale: Hurts a little bit Pain Intervention(s): Limited activity within patient's tolerance, Monitored during session    [place] expects to be discharged to:: Private residence Living Arrangements: Spouse/significant other Available Help at Discharge: Family;Available 24 hours/day Type of Home: House Home Access: Stairs to enter Entrance Stairs-Rails: Can reach both Entrance Stairs-Number of Steps: 4   Home Layout: One level (down 2 small steps into den then up to kitchen) Home Equipment: Shower seat;Grab bars - tub/shower;Rollator (4 wheels)      Prior Function Prior Level of Function : Independent/Modified Independent             Mobility Comments: 4WW for nightime mobility ADLs Comments: kneels in front of toilet to empty colostomy bag.     Hand Dominance   Dominant Hand: Right    Extremity/Trunk Assessment   Upper Extremity Assessment Upper Extremity Assessment: RUE deficits/detail RUE Deficits / Details: ROM due to pain; RUE 4-/5 grossly RUE Sensation: WNL RUE Coordination: decreased gross motor    Lower Extremity Assessment Lower Extremity Assessment: Defer to PT evaluation RLE Deficits / Details: RLE 3/5 for hip flexion, RLE 4-/5 for knee flexion/extension. RLE Sensation: history of peripheral neuropathy RLE Coordination: decreased gross motor LLE Deficits / Details: LLE 4/5 grossly LLE Sensation: history of peripheral neuropathy       Communication   Communication: No difficulties  Cognition Arousal/Alertness: Awake/alert Behavior During Therapy: WFL for tasks assessed/performed Overall Cognitive Status: Within Functional Limits for tasks assessed                                          General Comments       Exercises     Assessment/Plan    PT Assessment Patient needs continued PT services  PT Problem List Decreased strength;Decreased activity tolerance;Decreased safety awareness;Decreased balance;Decreased mobility;Decreased coordination;Impaired sensation       PT Treatment Interventions DME instruction;Balance training;Gait training;Neuromuscular re-education;Stair training;Functional mobility training;Patient/family education;Therapeutic activities;Therapeutic exercise    PT Goals (Current goals can be found in the Care Plan section)  Acute Rehab PT Goals Patient Stated Goal: improve symptoms/get better to go home. PT Goal Formulation: With patient Time For Goal Achievement: 08/30/21 Potential to Achieve Goals: Good    Frequency 7X/week     Co-evaluation               AM-PAC PT "6 Clicks" Mobility  Outcome Measure Help needed turning from your back to your side while in a flat bed without using bedrails?: A Little Help needed moving from lying on your back to sitting on the side of a flat bed without using bedrails?: A Little Help needed moving to and from a bed to a chair (including a wheelchair)?: A Little Help needed standing up from a chair using your arms (e.g., wheelchair or bedside chair)?: A Little Help needed to walk in hospital room?: A Little Help needed climbing  3-5 steps with a railing? : A Lot 6 Click Score: 17    End of Session Equipment Utilized During Treatment: Gait belt Activity Tolerance: Patient limited by fatigue Patient left: in bed;with call bell/phone within reach Nurse Communication: Mobility status PT Visit Diagnosis: Unsteadiness on feet (R26.81);History of falling (Z91.81);Muscle weakness (generalized) (M62.81);Ataxic gait (R26.0)    Time: 1100-1135 PT Time Calculation (min) (ACUTE ONLY): 35 min   Charges:   PT Evaluation $PT Eval Low Complexity: 1 Low PT Treatments $Therapeutic Activity: 23-37 mins       Jonnie Kind,  SPT 08/16/2021, 2:14 PM

## 2021-08-17 ENCOUNTER — Observation Stay
Admit: 2021-08-17 | Discharge: 2021-08-17 | Disposition: A | Discharge status: Home / Self Care | Payer: No Typology Code available for payment source | Attending: Hospitalist | Admitting: Hospitalist

## 2021-08-17 DIAGNOSIS — E1122 Type 2 diabetes mellitus with diabetic chronic kidney disease: Secondary | ICD-10-CM | POA: Diagnosis present

## 2021-08-17 DIAGNOSIS — N1832 Chronic kidney disease, stage 3b: Secondary | ICD-10-CM | POA: Diagnosis present

## 2021-08-17 DIAGNOSIS — I10 Essential (primary) hypertension: Secondary | ICD-10-CM | POA: Diagnosis not present

## 2021-08-17 DIAGNOSIS — I129 Hypertensive chronic kidney disease with stage 1 through stage 4 chronic kidney disease, or unspecified chronic kidney disease: Secondary | ICD-10-CM | POA: Diagnosis present

## 2021-08-17 DIAGNOSIS — I6330 Cerebral infarction due to thrombosis of unspecified cerebral artery: Secondary | ICD-10-CM | POA: Diagnosis not present

## 2021-08-17 DIAGNOSIS — N183 Chronic kidney disease, stage 3 unspecified: Secondary | ICD-10-CM | POA: Diagnosis not present

## 2021-08-17 DIAGNOSIS — G20 Parkinson's disease: Secondary | ICD-10-CM | POA: Diagnosis present

## 2021-08-17 DIAGNOSIS — I6381 Other cerebral infarction due to occlusion or stenosis of small artery: Secondary | ICD-10-CM | POA: Diagnosis present

## 2021-08-17 DIAGNOSIS — Z8249 Family history of ischemic heart disease and other diseases of the circulatory system: Secondary | ICD-10-CM | POA: Diagnosis not present

## 2021-08-17 DIAGNOSIS — G629 Polyneuropathy, unspecified: Secondary | ICD-10-CM | POA: Diagnosis present

## 2021-08-17 DIAGNOSIS — I639 Cerebral infarction, unspecified: Secondary | ICD-10-CM | POA: Diagnosis present

## 2021-08-17 DIAGNOSIS — Z880 Allergy status to penicillin: Secondary | ICD-10-CM | POA: Diagnosis not present

## 2021-08-17 DIAGNOSIS — E785 Hyperlipidemia, unspecified: Secondary | ICD-10-CM | POA: Diagnosis present

## 2021-08-17 DIAGNOSIS — Z933 Colostomy status: Secondary | ICD-10-CM | POA: Diagnosis not present

## 2021-08-17 DIAGNOSIS — S40011A Contusion of right shoulder, initial encounter: Secondary | ICD-10-CM | POA: Diagnosis present

## 2021-08-17 DIAGNOSIS — I440 Atrioventricular block, first degree: Secondary | ICD-10-CM | POA: Diagnosis present

## 2021-08-17 DIAGNOSIS — R29701 NIHSS score 1: Secondary | ICD-10-CM | POA: Diagnosis present

## 2021-08-17 DIAGNOSIS — Z881 Allergy status to other antibiotic agents status: Secondary | ICD-10-CM | POA: Diagnosis not present

## 2021-08-17 DIAGNOSIS — I951 Orthostatic hypotension: Secondary | ICD-10-CM | POA: Diagnosis not present

## 2021-08-17 DIAGNOSIS — I69351 Hemiplegia and hemiparesis following cerebral infarction affecting right dominant side: Secondary | ICD-10-CM | POA: Diagnosis not present

## 2021-08-17 DIAGNOSIS — Z823 Family history of stroke: Secondary | ICD-10-CM | POA: Diagnosis not present

## 2021-08-17 DIAGNOSIS — W1830XA Fall on same level, unspecified, initial encounter: Secondary | ICD-10-CM | POA: Diagnosis present

## 2021-08-17 DIAGNOSIS — Z85048 Personal history of other malignant neoplasm of rectum, rectosigmoid junction, and anus: Secondary | ICD-10-CM | POA: Diagnosis not present

## 2021-08-17 DIAGNOSIS — E7849 Other hyperlipidemia: Secondary | ICD-10-CM | POA: Diagnosis not present

## 2021-08-17 DIAGNOSIS — Z20822 Contact with and (suspected) exposure to covid-19: Secondary | ICD-10-CM | POA: Diagnosis present

## 2021-08-17 LAB — BASIC METABOLIC PANEL
Anion gap: 7 (ref 5–15)
BUN: 31 mg/dL — ABNORMAL HIGH (ref 8–23)
CO2: 27 mmol/L (ref 22–32)
Calcium: 8.5 mg/dL — ABNORMAL LOW (ref 8.9–10.3)
Chloride: 105 mmol/L (ref 98–111)
Creatinine, Ser: 2.07 mg/dL — ABNORMAL HIGH (ref 0.61–1.24)
GFR, Estimated: 33 mL/min — ABNORMAL LOW (ref >60)
Glucose, Bld: 135 mg/dL — ABNORMAL HIGH (ref 70–99)
Potassium: 3.7 mmol/L (ref 3.5–5.1)
Sodium: 139 mmol/L (ref 135–145)

## 2021-08-17 LAB — HEMOGLOBIN A1C
Hgb A1c MFr Bld: 6.3 % — ABNORMAL HIGH (ref 4.8–5.6)
Mean Plasma Glucose: 134 mg/dL

## 2021-08-17 LAB — CBC
HCT: 32.3 % — ABNORMAL LOW (ref 39.0–52.0)
Hemoglobin: 10.5 g/dL — ABNORMAL LOW (ref 13.0–17.0)
MCH: 28.2 pg (ref 26.0–34.0)
MCHC: 32.5 g/dL (ref 30.0–36.0)
MCV: 86.8 fL (ref 80.0–100.0)
Platelets: 207 10*3/uL (ref 150–400)
RBC: 3.72 MIL/uL — ABNORMAL LOW (ref 4.22–5.81)
RDW: 13.4 % (ref 11.5–15.5)
WBC: 7 10*3/uL (ref 4.0–10.5)
nRBC: 0 % (ref 0.0–0.2)

## 2021-08-17 LAB — ECHOCARDIOGRAM COMPLETE
AR max vel: 1.7 cm2
AV Area VTI: 1.74 cm2
AV Area mean vel: 1.76 cm2
AV Mean grad: 10.7 mmHg
AV Peak grad: 17.7 mmHg
Ao pk vel: 2.1 m/s
Area-P 1/2: 2.72 cm2
Height: 70 in
MV VTI: 3.78 cm2
S' Lateral: 3 cm
Weight: 3200 oz

## 2021-08-17 LAB — MAGNESIUM: Magnesium: 2 mg/dL (ref 1.7–2.4)

## 2021-08-17 NOTE — Progress Notes (Signed)
Inpatient Rehab Admissions Coordinator:   I spoke with Pt. Regarding potential CIR admit. He states interest, and that wife can provide 24/7 support. I will open a case with his insurance and pursue for potential admission.  Clemens Catholic, [place], [place] Admissions Coordinator  [phone] ([place]) [phone] (office)

## 2021-08-17 NOTE — Progress Notes (Signed)
PROGRESS NOTE    Justin Mann  YIF:[id number] DOB: 1946/06/03 DOA: 08/16/2021 PCP: [facility]  Assessment & Plan:   Principal Problem:   Stroke (cerebrum) [facility]) Active Problems:   CVA (cerebral vascular accident) ([place])   Acute CVA: w/ small acute infarcts in the left parietal corona radiata and subcortical white matter as per MRI brain. MRA head/neck without significant stenosis. Continue on statin. Echo is pending. Neuro following and recs apprec    HTN: can restart home dose of lisinopril, amlodipine if ok with neuro   HLD: continue on statin    Parkinson: continue on home dose of sinemet   Hx of colon cancer: s/p colostomy. Continue w/ supportive care   DM2: well controlled, HbA1c 6.3. 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 Avoid nephrotoxic meds    DVT prophylaxis: lovenox  Code Status: full  Family Communication:  Disposition Plan: likely d/c to CIR  Level of care: Med-Surg  Status is: Inpatient Remains inpatient appropriate because: still waiting on echo results and will need CIR placement as well    Consultants:  Neuro   Procedures:  Antimicrobials:    Subjective: Pt c/o malaise   Objective: Vitals:   08/16/21 2329 08/17/21 0418 08/17/21 0724 08/17/21 1135  BP: 138/82 (!) 160/81 (!) 174/93 (!) 143/81  Pulse: 76 67 67 69  Resp: 18 18 15 17   Temp: 99 F (37.2 C) 98.6 F (37 C) 98.6 F (37 C) 98.1 F (36.7 C)  TempSrc: Oral Oral Oral Oral  SpO2: 96% 96% 97% 95%  Weight:      Height:        Intake/Output Summary (Last 24 hours) at 08/17/2021 1331 Last data filed at 08/17/2021 0500 Gross per 24 hour  Intake --  Output 530 ml  Net -530 ml   Filed Weights   08/16/21 0020  Weight: 90.7 kg    Examination:  General exam: Appears calm and comfortable  Respiratory system: Clear to auscultation. Respiratory effort normal. Cardiovascular system: S1 & S2 +. No rubs, gallops or clicks. No pedal  edema. Gastrointestinal system: Abdomen is nondistended, soft and nontender. Normal bowel sounds heard. Central nervous system: Alert and oriented. Moves all extremities  Psychiatry: Judgement and insight appear normal. Flat mood and affect     Data Reviewed: I have personally reviewed following labs and imaging studies  CBC: Recent Labs  Lab 08/16/21 0024 08/17/21 0534  WBC 9.0 7.0  NEUTROABS 6.4  --   HGB 11.4* 10.5*  HCT 35.3* 32.3*  MCV 86.1 86.8  PLT 220 867   Basic Metabolic Panel: Recent Labs  Lab 08/16/21 0024 08/17/21 0534  NA 138 139  K 3.7 3.7  CL 107 105  CO2 24 27  GLUCOSE 144* 135*  BUN 32* 31*  CREATININE 1.95* 2.07*  CALCIUM 8.7* 8.5*  MG  --  2.0   GFR: Estimated Creatinine Clearance: 36 mL/min (A) (by C-G formula based on SCr of 2.07 mg/dL (H)). Liver Function Tests: Recent Labs  Lab 08/16/21 0024  AST 19  ALT 5  ALKPHOS 63  BILITOT 1.0  PROT 7.3  ALBUMIN 3.6   No results for input(s): LIPASE, AMYLASE in the last 168 hours. No results for input(s): AMMONIA in the last 168 hours. Coagulation Profile: Recent Labs  Lab 08/16/21 0024  INR 1.0   Cardiac Enzymes: No results for input(s): CKTOTAL, CKMB, CKMBINDEX, TROPONINI in the last 168 hours. BNP (last 3 results) No results  for input(s): PROBNP in the last 8760 hours. HbA1C: Recent Labs    08/16/21 0024  HGBA1C 6.3*   CBG: Recent Labs  Lab 08/16/21 0022  GLUCAP 144*   Lipid Profile: Recent Labs    08/16/21 0024  CHOL 231*  HDL 39*  LDLCALC 146*  TRIG 228*  CHOLHDL 5.9   Thyroid Function Tests: No results for input(s): TSH, T4TOTAL, FREET4, T3FREE, THYROIDAB in the last 72 hours. Anemia Panel: No results for input(s): VITAMINB12, FOLATE, FERRITIN, TIBC, IRON, RETICCTPCT in the last 72 hours. Sepsis Labs: No results for input(s): PROCALCITON, LATICACIDVEN in the last 168 hours.  Recent Results (from the past 240 hour(s))  Resp Panel by RT-PCR (Flu A&B, Covid)  Nasopharyngeal Swab     Status: None   Collection Time: 08/16/21  2:43 AM   Specimen: Nasopharyngeal Swab; Nasopharyngeal(NP) swabs in vial transport medium  Result Value Ref Range Status   SARS Coronavirus 2 by RT PCR NEGATIVE NEGATIVE Final    Comment: (NOTE) SARS-CoV-2 target nucleic acids are NOT DETECTED.  The SARS-CoV-2 RNA is generally detectable in upper respiratory specimens during the acute phase of infection. The lowest concentration of SARS-CoV-2 viral copies this assay can detect is 138 copies/mL. A negative result does not preclude SARS-Cov-2 infection and should not be used as the sole basis for treatment or other patient management decisions. A negative result may occur with  improper specimen collection/handling, submission of specimen other than nasopharyngeal swab, presence of viral mutation(s) within the areas targeted by this assay, and inadequate number of viral copies(<138 copies/mL). A negative result must be combined with clinical observations, patient history, and epidemiological information. The expected result is Negative.  Fact Sheet for Patients:  EntrepreneurPulse.com.au  Fact Sheet for Healthcare Providers:  IncredibleEmployment.be  This test is no t yet approved or cleared by the [place] FDA and  has been authorized for detection and/or diagnosis of SARS-CoV-2 by FDA under an Emergency Use Authorization (EUA). This EUA will remain  in effect (meaning this test can be used) for the duration of the COVID-19 declaration under Section 564(b)(1) of the Act, 21 U.S.C.section 360bbb-3(b)(1), unless the authorization is terminated  or revoked sooner.       Influenza A by PCR NEGATIVE NEGATIVE Final   Influenza B by PCR NEGATIVE NEGATIVE Final    Comment: (NOTE) The Xpert Xpress SARS-CoV-2/FLU/RSV plus assay is intended as an aid in the diagnosis of influenza from Nasopharyngeal swab specimens and should not be  used as a sole basis for treatment. Nasal washings and aspirates are unacceptable for Xpert Xpress SARS-CoV-2/FLU/RSV testing.  Fact Sheet for Patients: EntrepreneurPulse.com.au  Fact Sheet for Healthcare Providers: IncredibleEmployment.be  This test is not yet approved or cleared by the [place] FDA and has been authorized for detection and/or diagnosis of SARS-CoV-2 by FDA under an Emergency Use Authorization (EUA). This EUA will remain in effect (meaning this test can be used) for the duration of the COVID-19 declaration under Section 564(b)(1) of the Act, 21 U.S.C. section 360bbb-3(b)(1), unless the authorization is terminated or revoked.  Performed at [facility], [address], [place], [place] [address]          Radiology Studies: DG Shoulder Right  Result Date: 08/16/2021 CLINICAL DATA:  Fall with right shoulder pain EXAM: RIGHT SHOULDER - 3 IEW COMPARISON:  None. FINDINGS: There is no evidence of fracture or dislocation. There is no evidence of arthropathy or other focal bone abnormality. Soft tissues are unremarkable. Calcified right  pulmonary nodules. IMPRESSION: Negative for fracture or dislocation. Electronically Signed   By: Jorje Guild M.D.   On: 08/16/2021 05:31   CT HEAD WO CONTRAST  Result Date: 08/16/2021 CLINICAL DATA:  Neuro deficit, acute, stroke suspected right arm and leg weakness EXAM: CT HEAD WITHOUT CONTRAST TECHNIQUE: Contiguous axial images were obtained from the base of the skull through the vertex without intravenous contrast. RADIATION DOSE REDUCTION: This exam was performed according to the departmental dose-optimization program which includes automated exposure control, adjustment of the mA and/or kV according to patient size and/or use of iterative reconstruction technique. COMPARISON:  Brain MRI 10/07/2020 FINDINGS: Brain: No acute intracranial hemorrhage. Small recurrent felt to in the right  thalamus, not definitively seen on prior MRI. No evidence of territorial or large vessel ischemia. Stable brain volume with atrophy and ventriculomegaly. Stable periventricular and deep chronic small vessel ischemia, allowing for differences in modality. No midline shift or mass effect. No subdural or extra-axial collection. Vascular: Atherosclerosis of skullbase vasculature without hyperdense vessel or abnormal calcification. Skull: No fracture or focal lesion. Sinuses/Orbits: Occasional mucosal thickening of ethmoid air cells. Bilateral lens extraction. Mastoid air cells are clear. Other: None. IMPRESSION: 1. Small lacunar infarct in the right thalamus, age indeterminate, but not definitively seen on prior MRI. 2. Stable atrophy and chronic small vessel ischemia. Electronically Signed   By: Keith Rake M.D.   On: 08/16/2021 00:52   CT Cervical Spine Wo Contrast  Result Date: 08/16/2021 CLINICAL DATA:  76 year old male with history of neck trauma. EXAM: CT CERVICAL SPINE WITHOUT CONTRAST TECHNIQUE: Multidetector CT imaging of the cervical spine was performed without intravenous contrast. Multiplanar CT image reconstructions were also generated. RADIATION DOSE REDUCTION: This exam was performed according to the departmental dose-optimization program which includes automated exposure control, adjustment of the mA and/or kV according to patient size and/or use of iterative reconstruction technique. COMPARISON:  No priors. FINDINGS: Alignment: Normal. Skull base and vertebrae: No acute fracture. No primary bone lesion or focal pathologic process. Soft tissues and spinal canal: No prevertebral fluid or swelling. No visible canal hematoma. Disc levels: Multilevel degenerative disc disease, most evident at C5-C6 and C6-C7. Mild multilevel facet arthropathy. Upper chest: Unremarkable. Other: None. IMPRESSION: 1. No evidence of significant acute traumatic injury to the cervical spine. 2. Mild multilevel  degenerative disc disease and cervical spondylosis, as above. Electronically Signed   By: Vinnie Langton M.D.   On: 08/16/2021 06:12   MR ANGIO HEAD WO CONTRAST  Result Date: 08/16/2021 CLINICAL DATA:  Neuro deficit with acute stroke suspected EXAM: MRA HEAD WITHOUT CONTRAST TECHNIQUE: Angiographic images of the Circle of Willis were acquired using MRA technique without intravenous contrast. COMPARISON:  Brain MRI from earlier the same day FINDINGS: Anterior circulation: Typical of ICA artifact appearance at the skull base. No branch occlusion, beading, or aneurysm. No significant stenosis. Posterior circulation: The basilar and covered vertebral arteries are smoothly contoured and widely patent. No branch occlusion, beading, or aneurysm. IMPRESSION: Negative intracranial MRA. Electronically Signed   By: Jorje Guild M.D.   On: 08/16/2021 06:02   MR ANGIO NECK WO CONTRAST  Result Date: 08/16/2021 CLINICAL DATA:  Neuro deficit with acute stroke suspected EXAM: MRA NECK WITHOUT CONTRAST TECHNIQUE: Angiographic images of the neck were acquired using MRA technique without intravenous contrast. Carotid stenosis measurements (when applicable) are obtained utilizing NASCET criteria, using the distal internal carotid diameter as the denominator. COMPARISON:  Brain MRI from earlier today FINDINGS: Aortic arch: Partial coverage is negative.  Right carotid system: Mild flow artifact at the proximal ICA. No flow reducing stenosis or visible dissection. Left carotid system: Vessel tortuosity. No stenosis or worrisome vessel irregularity. Vertebral arteries: No proximal subclavian stenosis. Codominant vertebral arteries which are smoothly contoured and widely patent to the dura. IMPRESSION: Unremarkable major vessels in the neck. No stenosis or embolic source seen. Electronically Signed   By: Jorje Guild M.D.   On: 08/16/2021 06:00   MR BRAIN WO CONTRAST  Result Date: 08/16/2021 CLINICAL DATA:  Stroke  suspected, right arm and leg weakness EXAM: MRI HEAD WITHOUT CONTRAST TECHNIQUE: Multiplanar, multiecho pulse sequences of the brain and surrounding structures were obtained without intravenous contrast. COMPARISON:  10/07/2020 MRI, correlation is also made with 08/16/2021 CT FINDINGS: Brain: Small area of restricted diffusion with ADC correlate in the left parietal corona radiata and subcortical white matter (series 5, images 37-40 and series 6, images 37-40). No acute hemorrhage, mass mass effect, or midline shift. No hydrocephalus or extra-axial collection. Lacunar infarcts in the left pons, bilateral basal ganglia, and left corona radiata. Confluent T2 hyperintense signal in the periventricular white matter and pons, likely the sequela of moderate chronic small vessel ischemic disease. Central cerebral atrophy is somewhat advanced for age. Vascular: Normal flow voids. Skull and upper cervical spine: Normal marrow signal. Sinuses/Orbits: Negative.  Status post bilateral lens replacements. Other: None. IMPRESSION: Small acute infarcts in the left parietal corona radiata and subcortical white matter. Electronically Signed   By: Merilyn Baba M.D.   On: 08/16/2021 02:29        Scheduled Meds:  aspirin EC  81 mg Oral Daily   atorvastatin  80 mg Oral Daily   carbidopa-levodopa  2 tablet Oral QID   clopidogrel  75 mg Oral Daily   enoxaparin (LOVENOX) injection  40 mg Subcutaneous Q24H   finasteride  5 mg Oral Daily   tamsulosin  0.8 mg Oral QHS   Continuous Infusions:   LOS: 0 days    Time spent: 33 mins     Wyvonnia Dusky, MD [facility] Pager [phone]  If 7PM-7AM, please contact night-coverage 08/17/2021, 1:31 PM

## 2021-08-17 NOTE — PMR Pre-admission (Signed)
PMR Admission Coordinator Pre-Admission Assessment  Patient: Justin Mann is an 76 y.o., male MRN: [id number] DOB: 12/04/1945 Height: 5' 10"  (177.8 cm) Weight: 90.7 kg  Insurance Information HMO:     PPO:      PCP:      IPA:      80/20:      OTHER:  PRIMARY: [place] ([place])      Policy#: [id number]      Subscriber: pt CM Name: Steva Ready      Phone#: [phone] ext [phone]     Fax#: [phone] Pre-Cert#/Referral #: [id number]  Approved for CIR starting 08/19/21    Employer:  Benefits:  Phone #: [phone]     Name: Earlean Polka Date: 11/22/2017 - still active Deductible: $0 OOP Max: $0 As long as Referral from ER was receive the following will be true:  CIR: 100% coverage, auth required SNF: 100% coverage, auth required Outpatient:  100% coverage, auth required Home Health:  100% coverage, auth required DME: 100% coverage, auth required Providers: in network  SECONDARY: [employer]      Policy#:[id number]     Phone#:   Development worker, community:       Phone#:   [employer] for patients in Inpatient Rehabilitation Facilities with attached Privacy Act [place] Records was provided and verbally reviewed with: Patient  Emergency Contact Information Contact Information     Name Relation Home Work Mobile   [place] Spouse [phone]  [phone]       Current Medical History  Patient Admitting Diagnosis: CVA History of Present Illness: Justin Mann is a 75 y.o. male with medical history significant of Parkinson's, Hx of colon cancer s/p colostomy, HTN, CKD3b who presented  to the [facility] ED on 08/16/2021 with acute onset right-sided arm and leg weakness. In the ED, initial vitals: afebrile, pulse 67, BP 199/95, sating 95% on room air.  Labs notable for Cr 1.95 (baseline).  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  Pt had neuro  tele consult in the ED.  Pt was out of window for thrombolytics.  Pt received ASA 324 mg in the ED and was admitted for full stroke workup. Pt. Was seen by PT and OT who recommended CIR to assist return to PLOF.    Patient's medical record from [facility] has been reviewed by the rehabilitation admission coordinator and physician.  Past Medical History  Past Medical History:  Diagnosis Date   Diabetes mellitus without complication ([place])    Foley catheter in place    Hypertension    Rectal cancer ([place])     Has the patient had major surgery during 100 days prior to admission? No  Family History   family history includes Heart attack in his father; Hypertension in his father and sister; Stroke in his sister.  Current Medications  Current Facility-Administered Medications:    acetaminophen (TYLENOL) tablet 1,000 mg, 1,000 mg, Oral, TID PRN, Enzo Bi, MD, 1,000 mg at 08/18/21 2007   amLODipine (NORVASC) tablet 5 mg, 5 mg, Oral, Daily, Wyvonnia Dusky, MD, 5 mg at 08/19/21 5929   aspirin EC tablet 81 mg, 81 mg, Oral, Daily, Greta Doom, MD, 81 mg at 08/19/21 0950   atorvastatin (LIPITOR) tablet 80 mg, 80 mg, Oral, Daily, Enzo Bi, MD, 80 mg at 08/19/21 0950   carbidopa-levodopa (SINEMET IR) 25-100 MG per tablet immediate release 2 tablet,  2 tablet, Oral, QID, Greta Doom, MD, 2 tablet at 08/19/21 0950   clopidogrel (PLAVIX) tablet 75 mg, 75 mg, Oral, Daily, Greta Doom, MD, 75 mg at 08/19/21 0950   docusate sodium (COLACE) capsule 100 mg, 100 mg, Oral, BID PRN, Enzo Bi, MD   enoxaparin (LOVENOX) injection 40 mg, 40 mg, Subcutaneous, Q24H, Enzo Bi, MD, 40 mg at 08/18/21 2006   finasteride (PROSCAR) tablet 5 mg, 5 mg, Oral, Daily, Enzo Bi, MD, 5 mg at 08/19/21 0950   loratadine (CLARITIN) tablet 10 mg, 10 mg, Oral, Daily, Wyvonnia Dusky, MD, 10 mg at 08/19/21 0612   ondansetron (ZOFRAN) injection 4 mg, 4 mg, Intravenous,  Q6H PRN, Enzo Bi, MD   ondansetron (ZOFRAN-ODT) disintegrating tablet 4 mg, 4 mg, Oral, Q8H PRN, Enzo Bi, MD   polyethylene glycol (MIRALAX / GLYCOLAX) packet 17 g, 17 g, Oral, BID PRN, Enzo Bi, MD   tamsulosin [facility]) capsule 0.8 mg, 0.8 mg, Oral, QHS, Enzo Bi, MD, 0.8 mg at 08/18/21 2007  Patients Current Diet:  Diet Order             Diet - low sodium heart healthy           Diet regular Room service appropriate? Yes; Fluid consistency: Thin  Diet effective now                   Precautions / Restrictions Precautions Precautions: Fall Restrictions Weight Bearing Restrictions: No   Has the patient had 2 or more falls or a fall with injury in the past year? No  Prior Activity Level Community (5-7x/wk): Pt. active in the community PTA  Prior Functional Level Self Care: Did the patient need help bathing, dressing, using the toilet or eating? Independent  Indoor Mobility: Did the patient need assistance with walking from room to room (with or without device)? Independent  Stairs: Did the patient need assistance with internal or external stairs (with or without device)? Independent  Functional Cognition: Did the patient need help planning regular tasks such as shopping or remembering to take medications? Independent  Patient Information Are you of Hispanic, Latino/a,or Spanish origin?: A. No, not of Hispanic, Latino/a, or Spanish origin What is your race?: A. White Do you need or want an interpreter to communicate with a doctor or health care staff?: 0. No  Patient's Response To:  Health Literacy and Transportation Is the patient able to respond to health literacy and transportation needs?: Yes Health Literacy - How often do you need to have someone help you when you read instructions, pamphlets, or other written material from your doctor or pharmacy?: Never In the past 12 months, has lack of transportation kept you from medical appointments or from getting  medications?: Yes In the past 12 months, has lack of transportation kept you from meetings, work, or from getting things needed for daily living?: No  Development worker, international aid / [place] Devices/Equipment: Other (Comment) Home Equipment: Shower seat, Grab bars - tub/shower, Rollator (4 wheels)  Prior Device Use: Indicate devices/aids used by the patient prior to current illness, exacerbation or injury? Walker  Current Functional Level Cognition  Overall Cognitive Status: Within Functional Limits for tasks assessed Orientation Level: Oriented X4 General Comments: states he does not recall HEP from prior day    Extremity Assessment (includes Sensation/Coordination)  Upper Extremity Assessment: RUE deficits/detail RUE Deficits / Details: 4-/5 grossly RUE Sensation: WNL RUE Coordination: decreased gross motor  Lower Extremity Assessment: Defer to PT  evaluation RLE Deficits / Details: RLE 3/5 for hip flexion, RLE 4-/5 for knee flexion/extension. RLE Sensation: history of peripheral neuropathy RLE Coordination: decreased gross motor LLE Deficits / Details: LLE 4/5 grossly LLE Sensation: history of peripheral neuropathy    ADLs  Overall ADL's : Needs assistance/impaired General ADL Comments: MIN A + HHA for ADL t/f, reaches out for support on furniture    Mobility  Overal bed mobility: Needs Assistance Bed Mobility: Supine to Sit Supine to sit: Min assist Sit to supine: Min guard    Transfers  Overall transfer level: Needs assistance Equipment used: 1 person hand held assist Transfers: Sit to/from Stand Sit to Stand: Min assist, From elevated surface General transfer comment: reaches for BUE support on furniture, cues for RLE clearance ~10 ft mobility    Ambulation / Gait / Stairs / Wheelchair Mobility  Ambulation/Gait Ambulation/Gait assistance: Min guard, Min assist Gait Distance (Feet): 60 Feet (3 bouts of 70f; PT led rest breaks due to fatigue) Assistive  device: Rolling walker (2 wheels) Gait Pattern/deviations: Step-to pattern, Decreased step length - right, Decreased step length - left, Decreased weight shift to right, Decreased stride length, Trunk flexed General Gait Details: R LE step length deficit; with increased fatigue R LE lags behind and decreases BOS; constant verbal cueing about fully clearing R foot w/ each step. Gait velocity: decreased    Posture / Balance Dynamic Sitting Balance Sitting balance - Comments: heavy flexed trunk in sitting Balance Overall balance assessment: Needs assistance Sitting-balance support: No upper extremity supported, Feet supported Sitting balance-Leahy Scale: Fair Sitting balance - Comments: heavy flexed trunk in sitting Postural control: Right lateral lean Standing balance support: During functional activity, Single extremity supported Standing balance-Leahy Scale: Fair Standing balance comment: heavy trunk flexion    Special needs/care consideration Bladder incontinence, External Urinary Catheter   Previous Home Environment (from acute therapy documentation) Living Arrangements: Spouse/significant other  Lives With: Spouse Available Help at Discharge: Family, Available 24 hours/day [place] of Home: [place] One level (down 2 small steps into den then up to kitchen) Home Access: Stairs to enter Entrance Stairs-Rails: Can reach both Entrance Stairs-Number of Steps: 4 Bathroom Shower/Tub: TChiropodist Standard Bathroom Accessibility: Yes How Accessible: Accessible via walker [place] No  Discharge Living Setting Plans for Discharge Living Setting: Patient's home Type of Home at Discharge: House Discharge Home Layout: One level Discharge Home Access: Stairs to enter Entrance Stairs-Rails: Can reach both Entrance Stairs-Number of Steps: 4 Discharge Bathroom Shower/Tub: Tub/shower unit Discharge Bathroom Toilet: Standard Discharge Bathroom  Accessibility: Yes How Accessible: Accessible via walker Does the patient have any problems obtaining your medications?: No  Social/Family/Support Systems Patient Roles: Spouse Contact Information: [phone] Caregiver: MJanet BerlinAbility/Limitations of Caregiver: Can provide min A Caregiver Availability: 24/7 Discharge Plan Discussed with Primary Caregiver: Yes Is Caregiver In Agreement with Plan?: Yes Does Caregiver/Family have Issues with Lodging/Transportation while Pt is in Rehab?: No  Goals Patient/Family Goal for Rehab: PT/OT Supervision Expected length of stay: 5-7 days Pt/Family Agrees to Admission and willing to participate: Yes Program Orientation Provided & Reviewed with Pt/Caregiver Including Roles  & Responsibilities: Yes  Decrease burden of Care through IP rehab admission: Specialzed equipment needs, Decrease number of caregivers, Bowel and bladder program, and Patient/family education  Possible need for SNF placement upon discharge: not anticipated   Patient Condition: [facility] CM, and patient. I met with patient at the  bedside for inpatient rehabilitation assessment.  Patient will benefit from ongoing PT and OT, can actively participate in 3 hours of therapy a day 5 days of the week, and can make measurable gains during the admission.  Patient will also benefit from the coordinated team approach during an Inpatient Acute Rehabilitation admission.  The patient will receive intensive therapy as well as Rehabilitation physician, nursing, social worker, and care management interventions.  Due to safety, skin/wound care, disease management, medication administration, pain management, and patient education the patient requires 24 hour a day rehabilitation nursing.  The patient is currently Min G-Min A with mobility and Min A with basic ADLs.  Discharge setting and therapy post discharge at home  with home health is anticipated.  Patient has agreed to participate in the Acute Inpatient Rehabilitation Program and will admit today.  Preadmission Screen Completed By:  Retta Diones, with updates by Gayland Curry 08/19/2021 10:36 AM ______________________________________________________________________   Discussed status with Dr. Ranell Patrick on 08/19/21  at 10:36 AM and received approval for admission today.  Admission Coordinator:  Retta Diones, RN, with updates by Gayland Curry, MS, CCC-SLP time 10:36 AM/Date 08/19/21    Assessment/Plan: Diagnosis: CVA Does the need for close, 24 hr/day Medical supervision in concert with the patient's rehab needs make it unreasonable for this patient to be served in a less intensive setting? Yes Co-Morbidities requiring supervision/potential complications: overweight, hyperglycemia, suboptimal potassium, AKI, hyperlipidemia Due to bladder management, bowel management, safety, skin/wound care, disease management, medication administration, pain management, and patient education, does the patient require 24 hr/day rehab nursing? Yes Does the patient require coordinated care of a physician, rehab nurse, PT, OT, and SLP to address physical and functional deficits in the context of the above medical diagnosis(es)? Yes Addressing deficits in the following areas: balance, endurance, locomotion, strength, transferring, bowel/bladder control, bathing, dressing, feeding, grooming, toileting, cognition, and psychosocial support Can the patient actively participate in an intensive therapy program of at least 3 hrs of therapy 5 days a week? Yes The potential for patient to make measurable gains while on inpatient rehab is excellent Anticipated functional outcomes upon discharge from inpatient rehab: supervision PT, supervision OT, supervision SLP Estimated rehab length of stay to reach the above functional goals is: 5-7 days Anticipated discharge  destination: Home 10. Overall Rehab/Functional Prognosis: excellent   MD Signature: Leeroy Cha, MD

## 2021-08-17 NOTE — Progress Notes (Signed)
Echo negative, A1c 6.3.   1)  aspirin 81 mg daily and Plavix 75 mg daily for 3 weeks followed by Plavix monotherapy 2) can cautiously lower BP to a goal of normotension over the next week or two.  3) atorvastatin 80mg  daily.  4) PT,OT,St 5) Neuro will be available as needed.   Roland Rack, MD [facility] [phone]  If 7pm- 7am, please page neurology on call as listed in [place].

## 2021-08-17 NOTE — Progress Notes (Signed)
*  PRELIMINARY RESULTS* Echocardiogram 2D Echocardiogram has been performed.  Justin Mann 08/17/2021, 8:56 AM

## 2021-08-17 NOTE — Progress Notes (Addendum)
Physical Therapy Treatment Patient Details Name: Justin Mann MRN: [id number] DOB: [birth year] Today's Date: 08/17/2021   History of Present Illness Pt is a 76 y.o. male who presents to the ED after reporting he became acutely weak around 9:30pm on 2/20, causing him to fall. 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 PmHx: Parkinson's Disease, DM, HTN, LE Neuropathy, colostomy bag.    PT Comments    Pt awake and alert resting in bed upon PT entrance into room today. Pt reports only c/o pain is his right shoulder pain, and states it is a 6/10 at rest. Pt is able to perform bed mobility w/ minA and heavy reliance on bed rails. Once seated EOB patient is able to stand w/ minA using RW; and requires verbal cues for proper UE placement for efficient utilization and safety. Once standing Pt was able to ambulate ~9ft w/ minA using RW, before increased report of fatigue and returning to recliner. Throughout ambulation he required constant verbal cues for proper R foot clearance and utilization of RW for energy conservation and optimal ambulation. Once back in bed, Pt was able to complete LE exercises in order to improve coordination (see "Misc. Exercises" section). Pt will benefit from continued skilled PT in order to increase LE strength, improve mobility/gait, and restore PLOF. Current discharge recommendation remains appropriate due to the level of assistance required by the patient to ensure safety and improve overall function.   Recommendations for follow up therapy are one component of a multi-disciplinary discharge planning process, led by the attending physician.  Recommendations may be updated based on patient status, additional functional criteria and insurance authorization.  Follow Up Recommendations  Acute inpatient rehab (3hours/day)     Assistance Recommended at Discharge Frequent or constant Supervision/Assistance  Patient can return home with the following A little  help with walking and/or transfers;A little help with bathing/dressing/bathroom;Assistance with cooking/housework;Assist for transportation;Help with stairs or ramp for entrance   Equipment Recommendations  Rolling walker (2 wheels)    Recommendations for Other Services       Precautions / Restrictions Precautions Precautions: Fall Restrictions Weight Bearing Restrictions: No     Mobility  Bed Mobility Overal bed mobility: Needs Assistance Bed Mobility: Supine to Sit     Supine to sit: Min assist Sit to supine: Min guard        Transfers Overall transfer level: Needs assistance Equipment used: Rolling walker (2 wheels) Transfers: Sit to/from Stand Sit to Stand: Min assist                Ambulation/Gait Ambulation/Gait assistance: Herbalist (Feet): 30 Feet Assistive device: Rolling walker (2 wheels) Gait Pattern/deviations: Step-to pattern, Decreased step length - right, Decreased step length - left, Decreased weight shift to right, Decreased stride length, Trunk flexed Gait velocity: decreased     General Gait Details: R LE step length deficit; with increased fatigue R LE lags behind and decreases BOS; cbnstant verbal cueing about fully clearing R foot w/ each step.   Stairs             Wheelchair Mobility    Modified Rankin (Stroke Patients Only)       Balance Overall balance assessment: Needs assistance Sitting-balance support: Feet supported, Bilateral upper extremity supported Sitting balance-Leahy Scale: Fair Sitting balance - Comments: heavy flexed trunk in sitting   Standing balance support: Single extremity supported, During functional activity Standing balance-Leahy Scale: Fair Standing balance comment: heavy trunk flexion  Cognition Arousal/Alertness: Awake/alert Behavior During Therapy: WFL for tasks assessed/performed Overall Cognitive Status: Within Functional Limits for  tasks assessed                                          Exercises Other Exercises Other Exercises: Bilateral Hip Flexion x10, Knee Extension x10, and, Ankle Pumps x10 to work on improving coordination; w/ intermittent verbal and visual cues    General Comments        Pertinent Vitals/Pain Pain Assessment Pain Assessment: 0-10 Pain Score: 6  Pain Location: R shoulder Pain Descriptors / Indicators: Discomfort, Grimacing, Dull Pain Intervention(s): Limited activity within patient's tolerance, Patient requesting pain meds-RN notified, Monitored during session    Home Living                          Prior Function            PT Goals (current goals can now be found in the care plan section) Progress towards PT goals: Progressing toward goals    Frequency    7X/week      PT Plan Current plan remains appropriate    Co-evaluation              AM-PAC PT "6 Clicks" Mobility   Outcome Measure  Help needed turning from your back to your side while in a flat bed without using bedrails?: A Little Help needed moving from lying on your back to sitting on the side of a flat bed without using bedrails?: A Little Help needed moving to and from a bed to a chair (including a wheelchair)?: A Little Help needed standing up from a chair using your arms (e.g., wheelchair or bedside chair)?: A Little Help needed to walk in hospital room?: A Little Help needed climbing 3-5 steps with a railing? : A Lot 6 Click Score: 17    End of Session Equipment Utilized During Treatment: Gait belt Activity Tolerance: Patient limited by fatigue;Patient tolerated treatment well Patient left: in bed;with call bell/phone within reach;with bed alarm set Nurse Communication: Mobility status PT Visit Diagnosis: Unsteadiness on feet (R26.81);History of falling (Z91.81);Muscle weakness (generalized) (M62.81);Ataxic gait (R26.0);Pain Pain - Right/Left: Right Pain - part of  body: Shoulder     Time: 1339-1406 PT Time Calculation (min) (ACUTE ONLY): 27 min  Charges:  $Therapeutic Exercise: 8-22 mins $Therapeutic Activity: 8-22 mins                      Jonnie Kind, SPT 08/17/2021, 4:09 PM

## 2021-08-17 NOTE — Progress Notes (Signed)
Occupational Therapy Treatment Patient Details Name: Justin Mann MRN: [id number] DOB: 1945/11/01 Today's Date: 08/17/2021   History of present illness Pt is a 76 y.o. male who presents to the ED after reporting he became acutely weak around 9:30pm on 2/20, causing him to fall. 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 PmHx: Parkinson's Disease, DM, HTN, LE Neuropathy, colostomy bag.   OT comments  Justin Mann was seen for OT treatment on this date. Upon arrival to room pt reclined in bed, spilled urinal in bed,  agreeable to tx. Pt requires MIN A don B socks long sitting in bed. MIN A + RW sit<>stand and  ~15 ft mobility, assist for RLE clearance and RW mgmt. Pt tolerated ~5 min functional reaching task with single UE support, MIN A to maintain midline posture. MAX A washing rear in standing - requires BUE support standing as pt fatigues. Pt making good progress toward goals. Pt continues to benefit from skilled OT services to maximize return to PLOF and minimize risk of future falls, injury, caregiver burden, and readmission. Will continue to follow POC. Discharge recommendation remains appropriate.     Recommendations for follow up therapy are one component of a multi-disciplinary discharge planning process, led by the attending physician.  Recommendations may be updated based on patient status, additional functional criteria and insurance authorization.    Follow Up Recommendations  Acute inpatient rehab (3hours/day)    Assistance Recommended at Discharge Frequent or constant Supervision/Assistance  Patient can return home with the following  Two people to help with walking and/or transfers;Two people to help with bathing/dressing/bathroom;Help with stairs or ramp for entrance;Assistance with cooking/housework   Equipment Recommendations  BSC/3in1    Recommendations for Other Services      Precautions / Restrictions Precautions Precautions:  Fall Restrictions Weight Bearing Restrictions: No       Mobility Bed Mobility Overal bed mobility: Needs Assistance Bed Mobility: Supine to Sit     Supine to sit: Min assist          Transfers Overall transfer level: Needs assistance Equipment used: Rolling walker (2 wheels) Transfers: Sit to/from Stand Sit to Stand: Min assist                 Balance Overall balance assessment: Needs assistance Sitting-balance support: Feet supported, Bilateral upper extremity supported Sitting balance-Leahy Scale: Fair     Standing balance support: Single extremity supported, During functional activity Standing balance-Leahy Scale: Fair                             ADL either performed or assessed with clinical judgement   ADL Overall ADL's : Needs assistance/impaired                                       General ADL Comments: MIN A don B socks long sitting in bed. MIN A + RW for ADL t/f. Tolerated ~5 min functional reaching task with single UE support. MAX A washing rear in standing - requires BUE support standing as pt fatigues.       Cognition Arousal/Alertness: Awake/alert Behavior During Therapy: WFL for tasks assessed/performed Overall Cognitive Status: Within Functional Limits for tasks assessed  Pertinent Vitals/ Pain       Pain Assessment Pain Assessment: No/denies pain   Frequency  Min 5X/week        Progress Toward Goals  OT Goals(current goals can now be found in the care plan section)  Progress towards OT goals: Progressing toward goals  Acute Rehab OT Goals Patient Stated Goal: to go to rehab OT Goal Formulation: With patient/family Time For Goal Achievement: 08/30/21 Potential to Achieve Goals: Good ADL Goals Pt Will Perform Grooming: with set-up;with supervision;standing Pt Will Perform Upper Body Dressing: with modified independence;sitting Pt Will  Perform Lower Body Dressing: with min assist;sit to/from stand Pt Will Transfer to Toilet: with min guard assist;ambulating;bedside commode  Plan Discharge plan remains appropriate;Frequency remains appropriate       AM-PAC OT "6 Clicks" Daily Activity     Outcome Measure   Help from another person eating meals?: A Little Help from another person taking care of personal grooming?: A Lot Help from another person toileting, which includes using toliet, bedpan, or urinal?: A Lot Help from another person bathing (including washing, rinsing, drying)?: A Lot Help from another person to put on and taking off regular upper body clothing?: A Little Help from another person to put on and taking off regular lower body clothing?: A Little 6 Click Score: 15    End of Session    OT Visit Diagnosis: Other abnormalities of gait and mobility (R26.89);Muscle weakness (generalized) (M62.81)   Activity Tolerance Patient tolerated treatment well   Patient Left in chair;with call bell/phone within reach;with nursing/sitter in room   Nurse Communication Mobility status        Time: 1033-1100 OT Time Calculation (min): 27 min  Charges: OT General Charges $OT Visit: 1 Visit OT Treatments $Self Care/Home Management : 8-22 mins $Therapeutic Activity: 8-22 mins  Dessie Coma, M.S. OTR/L  08/17/21, 12:47 PM  ascom [phone]

## 2021-08-18 LAB — CBC
HCT: 30.8 % — ABNORMAL LOW (ref 39.0–52.0)
Hemoglobin: 10.2 g/dL — ABNORMAL LOW (ref 13.0–17.0)
MCH: 28.1 pg (ref 26.0–34.0)
MCHC: 33.1 g/dL (ref 30.0–36.0)
MCV: 84.8 fL (ref 80.0–100.0)
Platelets: 190 10*3/uL (ref 150–400)
RBC: 3.63 MIL/uL — ABNORMAL LOW (ref 4.22–5.81)
RDW: 13.4 % (ref 11.5–15.5)
WBC: 7.6 10*3/uL (ref 4.0–10.5)
nRBC: 0 % (ref 0.0–0.2)

## 2021-08-18 LAB — BASIC METABOLIC PANEL
Anion gap: 8 (ref 5–15)
BUN: 37 mg/dL — ABNORMAL HIGH (ref 8–23)
CO2: 24 mmol/L (ref 22–32)
Calcium: 8.9 mg/dL (ref 8.9–10.3)
Chloride: 107 mmol/L (ref 98–111)
Creatinine, Ser: 2.05 mg/dL — ABNORMAL HIGH (ref 0.61–1.24)
GFR, Estimated: 34 mL/min — ABNORMAL LOW (ref >60)
Glucose, Bld: 129 mg/dL — ABNORMAL HIGH (ref 70–99)
Potassium: 3.8 mmol/L (ref 3.5–5.1)
Sodium: 139 mmol/L (ref 135–145)

## 2021-08-18 LAB — MAGNESIUM: Magnesium: 2.1 mg/dL (ref 1.7–2.4)

## 2021-08-18 MED ORDER — AMLODIPINE BESYLATE 5 MG PO TABS
5.0000 mg | ORAL_TABLET | Freq: Every day | ORAL | Status: DC
  Administered 2021-08-18 – 2021-08-19 (×2): 5 mg via ORAL
  Filled 2021-08-18 (×2): qty 1

## 2021-08-18 MED ORDER — LORATADINE 10 MG PO TABS
10.0000 mg | ORAL_TABLET | Freq: Every day | ORAL | Status: DC
  Administered 2021-08-18 – 2021-08-19 (×2): 10 mg via ORAL
  Filled 2021-08-18 (×2): qty 1

## 2021-08-18 NOTE — Progress Notes (Signed)
Occupational Therapy Treatment Patient Details Name: Justin Mann MRN: [id number] DOB: 26-Mar-1946 Today's Date: 08/18/2021   History of present illness Pt is a 76 y.o. male who presents to the ED after reporting he became acutely weak around 9:30pm on 2/20, causing him to fall. 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 PmHx: Parkinson's Disease, DM, HTN, LE Neuropathy, colostomy bag.   OT comments  Mr Crihfield was seen for OT treatment on this date. Upon arrival to room pt reclined in bed, agreeable to tx. Pt requires MOD A don/doff B socks seated EOB, assist for R lateral lean in dynamic sitting. MIN A + HHA sit<>stand - pt reports using RW at night time only. MIN A tooth bruhsing and hand washing standing sinkside utilizing BUE forearm support. Pt tolerates ~5 min standing prior to requiring rest break, noted RLE buckling. Instructed on HEP, completed seated therex as described below. Pt making good progress toward goals. Will continue to follow POC. Discharge recommendation remains appropriate.     Recommendations for follow up therapy are one component of a multi-disciplinary discharge planning process, led by the attending physician.  Recommendations may be updated based on patient status, additional functional criteria and insurance authorization.    Follow Up Recommendations  Acute inpatient rehab (3hours/day)    Assistance Recommended at Discharge Frequent or constant Supervision/Assistance  Patient can return home with the following  Two people to help with walking and/or transfers;Two people to help with bathing/dressing/bathroom;Help with stairs or ramp for entrance;Assistance with cooking/housework   Equipment Recommendations  BSC/3in1    Recommendations for Other Services      Precautions / Restrictions Precautions Precautions: Fall Restrictions Weight Bearing Restrictions: No       Mobility Bed Mobility Overal bed mobility: Needs Assistance Bed  Mobility: Supine to Sit, Sit to Supine     Supine to sit: Min guard Sit to supine: Min guard        Transfers Overall transfer level: Needs assistance Equipment used: 1 person hand held assist Transfers: Sit to/from Stand Sit to Stand: Min assist, From elevated surface                 Balance Overall balance assessment: Needs assistance Sitting-balance support: No upper extremity supported, Feet supported Sitting balance-Leahy Scale: Poor   Postural control: Right lateral lean Standing balance support: Bilateral upper extremity supported, During functional activity Standing balance-Leahy Scale: Fair                             ADL either performed or assessed with clinical judgement   ADL Overall ADL's : Needs assistance/impaired                                       General ADL Comments: MOD A don/doff B socks seated EOB, assist for R lateral lean in dynamic sitting. MIN A tooth bruhsing and hand washing standing sinkside utilizing BUE forearm support. Pt tolerates ~5 min standing prior to requiring rest break, noted RLE buckling.      Cognition Arousal/Alertness: Awake/alert Behavior During Therapy: WFL for tasks assessed/performed Overall Cognitive Status: Within Functional Limits for tasks assessed  Exercises Exercises: Other exercises, General Upper Extremity, General Lower Extremity Other Exercises Other Exercises: 1 set x 10 reps each BLE knee extension, marches, toe/heel raises. Seated lateral and ant/posterior crunches with no UE support    Pertinent Vitals/ Pain       Pain Assessment Pain Assessment: 0-10 Pain Score: 6  Pain Location: R shoulder Pain Descriptors / Indicators: Discomfort, Grimacing, Dull Pain Intervention(s): Limited activity within patient's tolerance, Repositioned   Frequency  Min 5X/week        Progress Toward Goals  OT Goals(current  goals can now be found in the care plan section)  Progress towards OT goals: Progressing toward goals  Acute Rehab OT Goals Patient Stated Goal: to get better OT Goal Formulation: With patient/family Time For Goal Achievement: 08/30/21 Potential to Achieve Goals: Good ADL Goals Pt Will Perform Grooming: with set-up;with supervision;standing Pt Will Perform Upper Body Dressing: with modified independence;sitting Pt Will Perform Lower Body Dressing: with min assist;sit to/from stand Pt Will Transfer to Toilet: with min guard assist;ambulating;bedside commode  Plan Discharge plan remains appropriate;Frequency remains appropriate    Co-evaluation                 AM-PAC OT "6 Clicks" Daily Activity     Outcome Measure   Help from another person eating meals?: A Little Help from another person taking care of personal grooming?: A Lot Help from another person toileting, which includes using toliet, bedpan, or urinal?: A Lot Help from another person bathing (including washing, rinsing, drying)?: A Lot Help from another person to put on and taking off regular upper body clothing?: A Little Help from another person to put on and taking off regular lower body clothing?: A Little 6 Click Score: 15    End of Session Equipment Utilized During Treatment: Gait belt  OT Visit Diagnosis: Other abnormalities of gait and mobility (R26.89);Muscle weakness (generalized) (M62.81)   Activity Tolerance Patient tolerated treatment well   Patient Left in bed;with call bell/phone within reach;with bed alarm set   Nurse Communication          Time: 1453-1510 OT Time Calculation (min): 17 min  Charges: OT General Charges $OT Visit: 1 Visit OT Treatments $Self Care/Home Management : 8-22 mins  Dessie Coma, M.S. OTR/L  08/18/21, 3:43 PM  ascom [phone]

## 2021-08-18 NOTE — Progress Notes (Addendum)
[facility] IP rehab admissions - Noted patient doing well with transfers and mobility needing min guard to min assist to walk 60 feet.  We have received approval for CIR admission for tomorrow, 08/19/21.  I will have my partner follow up in am.  We should have a decision for you tomorrow by 10 am.  Call for questions.  [phone]

## 2021-08-18 NOTE — TOC Progression Note (Signed)
Transition of Care [facility]) - Progression Note    Patient Details  Name: Justin Mann MRN: [id number] Date of Birth: 1946/02/24  [facility]) CM/SW [place], RN Phone Number: 08/18/2021, 9:48 AM  Clinical Narrative:   CIR pending evaluation, awaiting response         Expected Discharge Plan and Services                                                 Social Determinants of Health (SDOH) Interventions    Readmission Risk Interventions No flowsheet data found.

## 2021-08-18 NOTE — Progress Notes (Signed)
[facility] IP rehab admissions - I spoke with case manager at [facility] this am.  She tells me that we are unlikely to have a decision today regarding potential inpatient rehab admission.  Once I hear back from [place] case manager, I will notify all of decision.  Call me for questions.  [phone]

## 2021-08-18 NOTE — Progress Notes (Addendum)
Physical Therapy Treatment Patient Details Name: Justin Mann MRN: [id number] DOB: 1945/10/25 Today's Date: 08/18/2021   History of Present Illness Pt is a 76 y.o. male who presents to the ED after reporting he became acutely weak around 9:30pm on 2/20, causing him to fall. 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 PmHx: Parkinson's Disease, DM, HTN, LE Neuropathy, colostomy bag.    PT Comments    Pt awake and alert resting in bed, upon PT entrance into room for today's session. Pt reports R shoulder pain is still 6/10 currently at rest. He is able to perform bed mobility w/ SUPERVISION to sit EOB for static sitting balance, but required CGA/minA for ADL. Once seated EOB, he is able to stand w/ CGA using RW, though cued for hand placement each time. Pt was able to ambulate ~38ft total today broken up into 3 bouts of 65ft w/ CGA-minA using RW. Ambulation was broken up into 3 bouts due to increased fatigue; verbal cues were provided throughout in order to emphasize continued R foot clearance throughout gait. Pt returned to recliner w/ all needs within reach.  Pt demonstrated good motivation to progress session today. He will benefit from continued skilled PT in order to improve LE strength, mobility, gait, and restore PLOF. Current discharge recommendation remains appropriate due to the level of assistance required by the patient to ensure safety and improve overall function.   Recommendations for follow up therapy are one component of a multi-disciplinary discharge planning process, led by the attending physician.  Recommendations may be updated based on patient status, additional functional criteria and insurance authorization.  Follow Up Recommendations  Acute inpatient rehab (3hours/day)     Assistance Recommended at Discharge Frequent or constant Supervision/Assistance  Patient can return home with the following A little help with walking and/or transfers;A little help  with bathing/dressing/bathroom;Assistance with cooking/housework;Assist for transportation;Help with stairs or ramp for entrance   Equipment Recommendations  Rolling walker (2 wheels)    Recommendations for Other Services       Precautions / Restrictions Precautions Precautions: Fall Restrictions Weight Bearing Restrictions: No     Mobility  Bed Mobility Overal bed mobility: Needs Assistance Bed Mobility: Supine to Sit     Supine to sit: Supervision          Transfers Overall transfer level: Needs assistance Equipment used: Rolling walker (2 wheels) Transfers: Sit to/from Stand Sit to Stand: Min guard                Ambulation/Gait Ambulation/Gait assistance: Min guard: Min A Gait Distance (Feet): 60 Feet (3 bouts of 26ft; PT led rest breaks due to fatigue) Assistive device: Rolling walker (2 wheels) Gait Pattern/deviations: Step-to pattern, Decreased step length - right, Decreased step length - left, Decreased weight shift to right, Decreased stride length, Trunk flexed Gait velocity: decreased     General Gait Details: R LE step length deficit; with increased fatigue R LE lags behind and decreases BOS; constant verbal cueing about fully clearing R foot w/ each step.   Stairs             Wheelchair Mobility    Modified Rankin (Stroke Patients Only)       Balance Overall balance assessment: Needs assistance Sitting-balance support: Feet supported, Bilateral upper extremity supported Sitting balance-Leahy Scale: Fair Sitting balance - Comments: heavy flexed trunk in sitting   Standing balance support: Single extremity supported, During functional activity Standing balance-Leahy Scale: Fair Standing balance comment: heavy trunk flexion  Cognition Arousal/Alertness: Awake/alert Behavior During Therapy: WFL for tasks assessed/performed Overall Cognitive Status: Within Functional Limits for tasks  assessed                                          Exercises      General Comments        Pertinent Vitals/Pain Pain Assessment Pain Assessment: 0-10 Pain Score: 6  Pain Location: R shoulder Pain Descriptors / Indicators: Discomfort, Grimacing, Dull Pain Intervention(s): Limited activity within patient's tolerance, Monitored during session, Patient requesting pain meds-RN notified, Repositioned    Home Living                          Prior Function            PT Goals (current goals can now be found in the care plan section) Progress towards PT goals: Progressing toward goals    Frequency    7X/week      PT Plan Current plan remains appropriate    Co-evaluation              AM-PAC PT "6 Clicks" Mobility   Outcome Measure  Help needed turning from your back to your side while in a flat bed without using bedrails?: A Little Help needed moving from lying on your back to sitting on the side of a flat bed without using bedrails?: A Little Help needed moving to and from a bed to a chair (including a wheelchair)?: A Little Help needed standing up from a chair using your arms (e.g., wheelchair or bedside chair)?: A Little Help needed to walk in hospital room?: A Little Help needed climbing 3-5 steps with a railing? : A Lot 6 Click Score: 17    End of Session Equipment Utilized During Treatment: Gait belt Activity Tolerance: Patient limited by fatigue;Patient tolerated treatment well Patient left: with call bell/phone within reach;in chair;with chair alarm set Nurse Communication: Mobility status PT Visit Diagnosis: Unsteadiness on feet (R26.81);History of falling (Z91.81);Muscle weakness (generalized) (M62.81);Ataxic gait (R26.0);Pain Pain - Right/Left: Right Pain - part of body: Shoulder     Time: 1120-1145 PT Time Calculation (min) (ACUTE ONLY): 25 min  Charges:                         Jonnie Kind, SPT 08/18/2021,  12:38 PM

## 2021-08-18 NOTE — H&P (Incomplete)
Physical Medicine and Rehabilitation Admission H&P    Chief Complaint  Patient presents with   Functional deficits due to stroke.     HPI: Justin Mann. Arenson is a 76 year old male with history of HTN, PD ([facility]), CKD, rectal cancer s/p APR who was admitted on 08/16/20 with right sided weakness and difficulty walking. UDS  negative. 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  Right shoulder X rays were negative for fracture. 2D echo showed EF 65-70% with no wall abnormality and mild MVR.    Review of Systems  Musculoskeletal:  Positive for falls.    Past Medical History:  Diagnosis Date   Diabetes mellitus without complication ([place])    Foley catheter in place    Hypertension    Rectal cancer [facility])     Past Surgical History:  Procedure Laterality Date   ILEOSTOMY     TONSILLECTOMY      Family History  Problem Relation Age of Onset   Hypertension Father    Heart attack Father    Stroke Sister    Hypertension Sister     Social History: Married. Retired--used to work in [employer]. He reports that he has never smoked. He does not have any smokeless tobacco history on file. He reports current alcohol use- one drink every week. No history on file for drug use.   Allergies  Allergen Reactions   Penicillins Hives   Erythromycin Rash    Medications Prior to Admission  Medication Sig Dispense Refill   amLODipine (NORVASC) 10 MG tablet Take 5 mg by mouth daily.     atorvastatin (LIPITOR) 10 MG tablet Take 5 mg by mouth daily.     carbidopa-levodopa (SINEMET IR) 25-100 MG tablet Take 2 tablets by mouth in the morning, at noon, in the evening, and at bedtime. 0500, 1000, 1400, Bedtime     cholecalciferol (VITAMIN D3) 25 MCG (1000 UNIT) tablet Take 1,000 Units by mouth daily.     Cyanocobalamin (VITAMIN B-12 PO) Take 1 tablet by mouth daily.      finasteride (PROSCAR) 5 MG tablet Take 5 mg by mouth daily.     lisinopril (ZESTRIL) 20 MG tablet Take 20 mg by mouth daily.     naproxen sodium (ALEVE) 220 MG tablet Take 220 mg by mouth daily as needed (pain).     tamsulosin (FLOMAX) 0.4 MG CAPS capsule Take 0.8 mg by mouth at bedtime.        Home: Home Living Family/patient expects to be discharged to:: Private residence Living Arrangements: Spouse/significant other Available Help at Discharge: Family, Available 24 hours/day Type of Home: House Home Access: Stairs to enter [employer] of Steps: 4 Entrance Stairs-Rails: Can reach both Home Layout: One level (down 2 small steps into den then up to [employer]) Bathroom Shower/Tub: Chiropodist: Standard Bathroom Accessibility: Yes Home Equipment: Civil engineer, contracting, Grab bars - tub/shower, Rollator (4 wheels)  Lives With: Spouse   Functional History: Prior Function Prior Level of Function : Independent/Modified Independent Mobility Comments: 4WW for nightime mobility. Pt endorses ~5 falls in last 3 months, states r/t Parkinsons ADLs Comments: kneels in front of toilet to empty colostomy bag. Baseline Parkinsons tremors intermittently affect ADLs  Functional Status:  Mobility: Bed Mobility Overal bed mobility: Needs Assistance Bed Mobility: Supine to Sit Supine to sit: Min assist Sit to supine: Min guard Transfers Overall  transfer level: Needs assistance Equipment used: 1 person hand held assist Transfers: Sit to/from Stand Sit to Stand: Min assist, From elevated surface General transfer comment: reaches for BUE support on furniture, cues for RLE clearance ~10 ft mobility Ambulation/Gait Ambulation/Gait assistance: Min guard, Min assist Gait Distance (Feet): 60 Feet (3 bouts of 32ft; PT led rest breaks due to fatigue) Assistive device: Rolling walker (2 wheels) Gait Pattern/deviations: Step-to pattern, Decreased step length - right, Decreased step  length - left, Decreased weight shift to right, Decreased stride length, Trunk flexed General Gait Details: R LE step length deficit; with increased fatigue R LE lags behind and decreases BOS; constant verbal cueing about fully clearing R foot w/ each step. Gait velocity: decreased    ADL: ADL Overall ADL's : Needs assistance/impaired General ADL Comments: MIN A + HHA for ADL t/f, reaches out for support on furniture  Cognition: Cognition Overall Cognitive Status: Within Functional Limits for tasks assessed Orientation Level: Oriented X4 Cognition Arousal/Alertness: Awake/alert Behavior During Therapy: WFL for tasks assessed/performed Overall Cognitive Status: Within Functional Limits for tasks assessed General Comments: states he does not recall HEP from prior day   Blood pressure (!) 181/82, pulse (!) 58, temperature 97.7 F (36.5 C), temperature source Oral, resp. rate 16, height 5\' 10"  (1.778 m), weight 90.7 kg, SpO2 95 %. Physical Exam  Results for orders placed or performed during the hospital encounter of 08/16/21 (from the past 48 hour(s))  Basic metabolic panel     Status: Abnormal   Collection Time: 08/18/21  4:50 AM  Result Value Ref Range   Sodium 139 135 - 145 mmol/L   Potassium 3.8 3.5 - 5.1 mmol/L   Chloride 107 98 - 111 mmol/L   CO2 24 22 - 32 mmol/L   Glucose, Bld 129 (H) 70 - 99 mg/dL    Comment: Glucose reference range applies only to samples taken after fasting for at least 8 hours.   BUN 37 (H) 8 - 23 mg/dL   Creatinine, Ser 2.05 (H) 0.61 - 1.24 mg/dL   Calcium 8.9 8.9 - 10.3 mg/dL   GFR, Estimated 34 (L) >60 mL/min    Comment: (NOTE) Calculated using the CKD-EPI Creatinine Equation (2021)    Anion gap 8 5 - 15    Comment: Performed at [facility], [address] [place], [place] [address]  CBC     Status: Abnormal   Collection Time: 08/18/21  4:50 AM  Result Value Ref Range   WBC 7.6 4.0 - 10.5 K/uL   RBC 3.63 (L) 4.22 - 5.81 MIL/uL    Hemoglobin 10.2 (L) 13.0 - 17.0 g/dL   HCT 30.8 (L) 39.0 - 52.0 %   MCV 84.8 80.0 - 100.0 fL   MCH 28.1 26.0 - 34.0 pg   MCHC 33.1 30.0 - 36.0 g/dL   RDW 13.4 11.5 - 15.5 %   Platelets 190 150 - 400 K/uL   nRBC 0.0 0.0 - 0.2 %    Comment: Performed at [facility], [address] [place], [place] [address]  Magnesium     Status: None   Collection Time: 08/18/21  4:50 AM  Result Value Ref Range   Magnesium 2.1 1.7 - 2.4 mg/dL    Comment: Performed at [facility], [address] [place], [place] [address]  Basic metabolic panel     Status: Abnormal   Collection Time: 08/19/21  5:32 AM  Result Value Ref Range   Sodium 140 135 - 145  mmol/L   Potassium 3.8 3.5 - 5.1 mmol/L   Chloride 107 98 - 111 mmol/L   CO2 25 22 - 32 mmol/L   Glucose, Bld 125 (H) 70 - 99 mg/dL    Comment: Glucose reference range applies only to samples taken after fasting for at least 8 hours.   BUN 42 (H) 8 - 23 mg/dL   Creatinine, Ser 1.97 (H) 0.61 - 1.24 mg/dL   Calcium 8.7 (L) 8.9 - 10.3 mg/dL   GFR, Estimated 35 (L) >60 mL/min    Comment: (NOTE) Calculated using the CKD-EPI Creatinine Equation (2021)    Anion gap 8 5 - 15    Comment: Performed at [facility], [address] [place], [place] [address]  CBC     Status: Abnormal   Collection Time: 08/19/21  5:32 AM  Result Value Ref Range   WBC 6.6 4.0 - 10.5 K/uL   RBC 3.64 (L) 4.22 - 5.81 MIL/uL   Hemoglobin 10.3 (L) 13.0 - 17.0 g/dL   HCT 31.1 (L) 39.0 - 52.0 %   MCV 85.4 80.0 - 100.0 fL   MCH 28.3 26.0 - 34.0 pg   MCHC 33.1 30.0 - 36.0 g/dL   RDW 13.4 11.5 - 15.5 %   Platelets 189 150 - 400 K/uL   nRBC 0.0 0.0 - 0.2 %    Comment: Performed at [facility], [address], [place], [place] [address]  Magnesium     Status: None   Collection Time: 08/19/21  5:32 AM  Result Value Ref Range   Magnesium 2.3 1.7 - 2.4 mg/dL    Comment: Performed at [facility], [address] [place],   [address]   No results found.    Blood pressure (!) 181/82, pulse (!) 58, temperature 97.7 F (36.5 C), temperature source Oral, resp. rate 16, height 5\' 10"  (1.778 m), weight 90.7 kg, SpO2 95 %.  Medical Problem List and Plan: 1. Functional deficits secondary to ***  -patient may *** shower  -ELOS/Goals: *** 2.  Antithrombotics: -DVT/anticoagulation:  Pharmaceutical: Lovenox  -antiplatelet therapy: DAPT X 3 weeks followed by Plavix 3. Pain Management: tylenol prn 4. Mood: Lcsw to follow for evaluation and support  -antipsychotic agents: N/A 5. Neuropsych: This patient is  capable of making decisions on his own behalf. 6. Skin/Wound Care: Routine pressure relief measures. 7. Fluids/Electrolytes/Nutrition: Monitor I/O.  8. HTN: Permissive HTN with slow titration of BP meds  --continue 9. CKD: Avoid nephrotoxic meds and hypotensive episodes  --baseline SCr-1.95 to 2.08.  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 Nocturia: Gets up every 2 hours at nights. Has seen urology in the past.   --will use condom cath at nights to help with sleep.     ***  Bary Leriche, PA-C 08/19/2021

## 2021-08-18 NOTE — Progress Notes (Signed)
PROGRESS NOTE    Justin Mann  BMW:[id number] DOB: [birth year] DOA: 08/16/2021 [facility]  Assessment & Plan:   Principal Problem:   Stroke (cerebrum) [facility]) Active Problems:   CVA (cerebral vascular accident) ([place])   Acute CVA: w/ small acute infarcts in the left parietal corona radiata and subcortical white matter as per MRI brain. MRA head/neck without significant stenosis. Continue on statin. Echo shows EF 65-70%, no regional wall motion abnormalities, grade I diastolic dysfunction & no atrial level shunt was detected. Neuro recs apprec    HTN: restart home dose of amlodipine. Hold home dose of lisinopril    HLD: continue on statin    Parkinson: continue on home dose of sinemet   Hx of colon cancer: s/p colostomy. Continue w/ supportive care   DM2: HbA1c 6.3, well controlled. 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 Avoid nephrotoxic meds     DVT prophylaxis: lovenox  Code Status: full  Family Communication: discussed pt's care w/ pt's daughter at bedside and answered her questions  Disposition Plan: likely d/c to CIR  Level of care: Med-Surg  Status is: Inpatient Remains inpatient appropriate because: medically stable for d/c but waiting on CIR placement     Consultants:  Neuro   Procedures:  Antimicrobials:    Subjective: Pt c/o fatigue   Objective: Vitals:   08/17/21 2158 08/18/21 0041 08/18/21 0455 08/18/21 0731  BP: (!) 171/87 (!) 152/79 (!) 161/80 (!) 165/83  Pulse: 73 66 65 61  Resp:  19 19 16   Temp:  98 F (36.7 C) 98.1 F (36.7 C) 98 F (36.7 C)  TempSrc:      SpO2:  95% 97% 96%  Weight:      Height:        Intake/Output Summary (Last 24 hours) at 08/18/2021 0746 Last data filed at 08/18/2021 0455 Gross per 24 hour  Intake 360 ml  Output 850 ml  Net -490 ml   Filed Weights   08/16/21 0020  Weight: 90.7 kg    Examination:  General exam: Appears comfortable  Respiratory system:  clear breath sounds b/l  Cardiovascular system: S1/S2+. No rubs or clicks  Gastrointestinal system: Abd is soft, NT, ND & normal bowel sounds  Central nervous system: alert and oriented. Moves all extremities   Psychiatry: Judgement and insight appears normal. Flat mood and affect     Data Reviewed: I have personally reviewed following labs and imaging studies  CBC: Recent Labs  Lab 08/16/21 0024 08/17/21 0534 08/18/21 0450  WBC 9.0 7.0 7.6  NEUTROABS 6.4  --   --   HGB 11.4* 10.5* 10.2*  HCT 35.3* 32.3* 30.8*  MCV 86.1 86.8 84.8  PLT 220 207 272   Basic Metabolic Panel: Recent Labs  Lab 08/16/21 0024 08/17/21 0534 08/18/21 0450  NA 138 139 139  K 3.7 3.7 3.8  CL 107 105 107  CO2 24 27 24   GLUCOSE 144* 135* 129*  BUN 32* 31* 37*  CREATININE 1.95* 2.07* 2.05*  CALCIUM 8.7* 8.5* 8.9  MG  --  2.0 2.1   GFR: Estimated Creatinine Clearance: 36.4 mL/min (A) (by C-G formula based on SCr of 2.05 mg/dL (H)). Liver Function Tests: Recent Labs  Lab 08/16/21 0024  AST 19  ALT 5  ALKPHOS 63  BILITOT 1.0  PROT 7.3  ALBUMIN 3.6   No results for input(s): LIPASE, AMYLASE in the last 168 hours. No results for input(s):  AMMONIA in the last 168 hours. Coagulation Profile: Recent Labs  Lab 08/16/21 0024  INR 1.0   Cardiac Enzymes: No results for input(s): CKTOTAL, CKMB, CKMBINDEX, TROPONINI in the last 168 hours. BNP (last 3 results) No results for input(s): PROBNP in the last 8760 hours. HbA1C: Recent Labs    08/16/21 0024  HGBA1C 6.3*   CBG: Recent Labs  Lab 08/16/21 0022  GLUCAP 144*   Lipid Profile: Recent Labs    08/16/21 0024  CHOL 231*  HDL 39*  LDLCALC 146*  TRIG 228*  CHOLHDL 5.9   Thyroid Function Tests: No results for input(s): TSH, T4TOTAL, FREET4, T3FREE, THYROIDAB in the last 72 hours. Anemia Panel: No results for input(s): VITAMINB12, FOLATE, FERRITIN, TIBC, IRON, RETICCTPCT in the last 72 hours. Sepsis Labs: No results for  input(s): PROCALCITON, LATICACIDVEN in the last 168 hours.  Recent Results (from the past 240 hour(s))  Resp Panel by RT-PCR (Flu A&B, Covid) Nasopharyngeal Swab     Status: None   Collection Time: 08/16/21  2:43 AM   Specimen: Nasopharyngeal Swab; Nasopharyngeal(NP) swabs in vial transport medium  Result Value Ref Range Status   SARS Coronavirus 2 by RT PCR NEGATIVE NEGATIVE Final    Comment: (NOTE) SARS-CoV-2 target nucleic acids are NOT DETECTED.  The SARS-CoV-2 RNA is generally detectable in upper respiratory specimens during the acute phase of infection. The lowest concentration of SARS-CoV-2 viral copies this assay can detect is 138 copies/mL. A negative result does not preclude SARS-Cov-2 infection and should not be used as the sole basis for treatment or other patient management decisions. A negative result may occur with  improper specimen collection/handling, submission of specimen other than nasopharyngeal swab, presence of viral mutation(s) within the areas targeted by this assay, and inadequate number of viral copies(<138 copies/mL). A negative result must be combined with clinical observations, patient history, and epidemiological information. The expected result is Negative.  Fact Sheet for Patients:  EntrepreneurPulse.com.au  Fact Sheet for Healthcare Providers:  IncredibleEmployment.be  This test is no t yet approved or cleared by the [place] FDA and  has been authorized for detection and/or diagnosis of SARS-CoV-2 by FDA under an Emergency Use Authorization (EUA). This EUA will remain  in effect (meaning this test can be used) for the duration of the COVID-19 declaration under Section 564(b)(1) of the Act, 21 U.S.C.section 360bbb-3(b)(1), unless the authorization is terminated  or revoked sooner.       Influenza A by PCR NEGATIVE NEGATIVE Final   Influenza B by PCR NEGATIVE NEGATIVE Final    Comment: (NOTE) The  Xpert Xpress SARS-CoV-2/FLU/RSV plus assay is intended as an aid in the diagnosis of influenza from Nasopharyngeal swab specimens and should not be used as a sole basis for treatment. Nasal washings and aspirates are unacceptable for Xpert Xpress SARS-CoV-2/FLU/RSV testing.  Fact Sheet for Patients: EntrepreneurPulse.com.au  Fact Sheet for Healthcare Providers: IncredibleEmployment.be  This test is not yet approved or cleared by the [place] FDA and has been authorized for detection and/or diagnosis of SARS-CoV-2 by FDA under an Emergency Use Authorization (EUA). This EUA will remain in effect (meaning this test can be used) for the duration of the COVID-19 declaration under Section 564(b)(1) of the Act, 21 U.S.C. section 360bbb-3(b)(1), unless the authorization is terminated or revoked.  Performed at [facility], [address], [place], [place] [address]          Radiology Studies: ECHOCARDIOGRAM COMPLETE  Result Date: 08/17/2021    ECHOCARDIOGRAM  REPORT   Patient Name:   LEITH SZAFRANSKI Date of Exam: 08/17/2021 Medical Rec #:  [id number]   Height:       70.0 in Accession #:    [id number]  Weight:       200.0 lb Date of Birth:  [birth year]    BSA:          2.087 m Patient Age:    24 years    BP:           174/93 mmHg Patient Gender: M           HR:           67 bpm. Exam Location:  [facility] Procedure: 2D Echo, Cardiac Doppler and Color Doppler Indications:     Stroke I63.9  History:         Patient has no prior history of Echocardiogram examinations.                  Risk Factors:Diabetes and Hypertension.  Sonographer:     Sherrie Sport Referring Phys:  [id number] [place] Diagnosing Phys: Yolonda Kida MD  Sonographer Comments: Suboptimal parasternal window. IMPRESSIONS  1. Left ventricular ejection fraction, by estimation, is 65 to 70%. The left ventricle has normal function. The left ventricle has no regional wall motion abnormalities. The  left ventricular internal cavity size was mildly dilated. Left ventricular diastolic parameters are consistent with Grade I diastolic dysfunction (impaired relaxation).  2. Right ventricular systolic function is normal. The right ventricular size is normal.  3. The mitral valve is normal in structure. Mild mitral valve regurgitation.  4. The aortic valve is normal in structure. Aortic valve regurgitation is not visualized. FINDINGS  Left Ventricle: Left ventricular ejection fraction, by estimation, is 65 to 70%. The left ventricle has normal function. The left ventricle has no regional wall motion abnormalities. The left ventricular internal cavity size was mildly dilated. There is  no left ventricular hypertrophy. Left ventricular diastolic parameters are consistent with Grade I diastolic dysfunction (impaired relaxation). Right Ventricle: The right ventricular size is normal. No increase in right ventricular wall thickness. Right ventricular systolic function is normal. Left Atrium: Left atrial size was normal in size. Right Atrium: Right atrial size was normal in size. Pericardium: There is no evidence of pericardial effusion. Mitral Valve: The mitral valve is normal in structure. Mild mitral valve regurgitation. MV peak gradient, 3.7 mmHg. The mean mitral valve gradient is 2.0 mmHg. Tricuspid Valve: The tricuspid valve is normal in structure. Tricuspid valve regurgitation is trivial. Aortic Valve: The aortic valve is normal in structure. Aortic valve regurgitation is not visualized. Aortic valve mean gradient measures 10.7 mmHg. Aortic valve peak gradient measures 17.7 mmHg. Aortic valve area, by VTI measures 1.74 cm. Pulmonic Valve: The pulmonic valve was grossly normal. Pulmonic valve regurgitation is not visualized. Aorta: The ascending aorta was not well visualized. IAS/Shunts: No atrial level shunt detected by color flow Doppler.  LEFT VENTRICLE PLAX 2D LVIDd:         5.10 cm   Diastology LVIDs:          3.00 cm   LV e' medial:    3.26 cm/s LV PW:         1.60 cm   LV E/e' medial:  17.4 LV IVS:        1.15 cm   LV e' lateral:   5.98 cm/s LVOT diam:     2.00 cm   LV E/e' lateral: 9.5  LV SV:         82 LV SV Index:   39 LVOT Area:     3.14 cm  RIGHT VENTRICLE RV Basal diam:  3.60 cm RV S prime:     17.20 cm/s TAPSE (M-mode): 3.2 cm LEFT ATRIUM             Index        RIGHT ATRIUM           Index LA diam:        3.50 cm 1.68 cm/m   RA Area:     17.10 cm LA Vol (A2C):   48.2 ml 23.09 ml/m  RA Volume:   44.40 ml  21.27 ml/m LA Vol (A4C):   32.4 ml 15.52 ml/m LA Biplane Vol: 39.5 ml 18.92 ml/m  AORTIC VALVE                     PULMONIC VALVE AV Area (Vmax):    1.70 cm      PV Vmax:        1.20 m/s AV Area (Vmean):   1.76 cm      PV Vmean:       76.700 cm/s AV Area (VTI):     1.74 cm      PV VTI:         0.213 m AV Vmax:           210.33 cm/s   PV Peak grad:   5.8 mmHg AV Vmean:          153.333 cm/s  PV Mean grad:   3.0 mmHg AV VTI:            0.471 m       RVOT Peak grad: 9 mmHg AV Peak Grad:      17.7 mmHg AV Mean Grad:      10.7 mmHg LVOT Vmax:         114.00 cm/s LVOT Vmean:        85.800 cm/s LVOT VTI:          0.261 m LVOT/AV VTI ratio: 0.55  AORTA Ao Root diam: 4.00 cm MITRAL VALVE               TRICUSPID VALVE MV Area (PHT): 2.72 cm    TR Peak grad:   12.2 mmHg MV Area VTI:   3.78 cm    TR Vmax:        175.00 cm/s MV Peak grad:  3.7 mmHg MV Mean grad:  2.0 mmHg    SHUNTS MV Vmax:       0.96 m/s    Systemic VTI:  0.26 m MV Vmean:      65.9 cm/s   Systemic Diam: 2.00 cm MV Decel Time: 279 msec    Pulmonic VTI:  0.277 m MV E velocity: 56.60 cm/s MV A velocity: 96.00 cm/s MV E/A ratio:  0.59 Dwayne D Callwood MD Electronically signed by Yolonda Kida MD Signature Date/Time: 08/17/2021/4:48:07 PM    Final         Scheduled Meds:  aspirin EC  81 mg Oral Daily   atorvastatin  80 mg Oral Daily   carbidopa-levodopa  2 tablet Oral QID   clopidogrel  75 mg Oral Daily   enoxaparin (LOVENOX)  injection  40 mg Subcutaneous Q24H   finasteride  5 mg Oral Daily   tamsulosin  0.8 mg Oral QHS   Continuous Infusions:   LOS:  1 day    Time spent: 15 mins     Wyvonnia Dusky, MD [facility] Pager [phone]  If 7PM-7AM, please contact night-coverage 08/18/2021, 7:46 AM

## 2021-08-19 ENCOUNTER — Inpatient Hospital Stay (HOSPITAL_COMMUNITY)
Admission: RE | Admit: 2021-08-19 | Discharge: 2021-08-30 | DRG: 057 | Disposition: A | Discharge status: Home / Self Care | Payer: No Typology Code available for payment source | Source: Other Acute Inpatient Hospital | Attending: Physical Medicine & Rehabilitation | Admitting: Physical Medicine & Rehabilitation

## 2021-08-19 ENCOUNTER — Other Ambulatory Visit: Payer: Self-pay

## 2021-08-19 ENCOUNTER — Encounter (HOSPITAL_COMMUNITY): Payer: Self-pay | Admitting: Physical Medicine & Rehabilitation

## 2021-08-19 ENCOUNTER — Encounter: Payer: Self-pay | Admitting: Internal Medicine

## 2021-08-19 DIAGNOSIS — R351 Nocturia: Secondary | ICD-10-CM | POA: Diagnosis present

## 2021-08-19 DIAGNOSIS — Z79899 Other long term (current) drug therapy: Secondary | ICD-10-CM | POA: Diagnosis not present

## 2021-08-19 DIAGNOSIS — G20A1 Parkinson's disease without dyskinesia, without mention of fluctuations: Secondary | ICD-10-CM

## 2021-08-19 DIAGNOSIS — I6330 Cerebral infarction due to thrombosis of unspecified cerebral artery: Secondary | ICD-10-CM | POA: Diagnosis not present

## 2021-08-19 DIAGNOSIS — Z85048 Personal history of other malignant neoplasm of rectum, rectosigmoid junction, and anus: Secondary | ICD-10-CM | POA: Diagnosis not present

## 2021-08-19 DIAGNOSIS — G629 Polyneuropathy, unspecified: Secondary | ICD-10-CM | POA: Diagnosis present

## 2021-08-19 DIAGNOSIS — I639 Cerebral infarction, unspecified: Secondary | ICD-10-CM | POA: Diagnosis not present

## 2021-08-19 DIAGNOSIS — N400 Enlarged prostate without lower urinary tract symptoms: Secondary | ICD-10-CM

## 2021-08-19 DIAGNOSIS — E1122 Type 2 diabetes mellitus with diabetic chronic kidney disease: Secondary | ICD-10-CM | POA: Diagnosis present

## 2021-08-19 DIAGNOSIS — Z9049 Acquired absence of other specified parts of digestive tract: Secondary | ICD-10-CM | POA: Diagnosis not present

## 2021-08-19 DIAGNOSIS — I69351 Hemiplegia and hemiparesis following cerebral infarction affecting right dominant side: Secondary | ICD-10-CM | POA: Diagnosis present

## 2021-08-19 DIAGNOSIS — E1151 Type 2 diabetes mellitus with diabetic peripheral angiopathy without gangrene: Secondary | ICD-10-CM | POA: Diagnosis present

## 2021-08-19 DIAGNOSIS — I951 Orthostatic hypotension: Secondary | ICD-10-CM | POA: Diagnosis present

## 2021-08-19 DIAGNOSIS — Z880 Allergy status to penicillin: Secondary | ICD-10-CM | POA: Diagnosis not present

## 2021-08-19 DIAGNOSIS — Z7982 Long term (current) use of aspirin: Secondary | ICD-10-CM

## 2021-08-19 DIAGNOSIS — Z7902 Long term (current) use of antithrombotics/antiplatelets: Secondary | ICD-10-CM

## 2021-08-19 DIAGNOSIS — I63033 Cerebral infarction due to thrombosis of bilateral carotid arteries: Secondary | ICD-10-CM

## 2021-08-19 DIAGNOSIS — Z575 Occupational exposure to toxic agents in other industries: Secondary | ICD-10-CM

## 2021-08-19 DIAGNOSIS — Z8249 Family history of ischemic heart disease and other diseases of the circulatory system: Secondary | ICD-10-CM | POA: Diagnosis not present

## 2021-08-19 DIAGNOSIS — G4700 Insomnia, unspecified: Secondary | ICD-10-CM | POA: Diagnosis present

## 2021-08-19 DIAGNOSIS — N401 Enlarged prostate with lower urinary tract symptoms: Secondary | ICD-10-CM | POA: Diagnosis present

## 2021-08-19 DIAGNOSIS — Z881 Allergy status to other antibiotic agents status: Secondary | ICD-10-CM | POA: Diagnosis not present

## 2021-08-19 DIAGNOSIS — G20 Parkinson's disease: Secondary | ICD-10-CM | POA: Diagnosis present

## 2021-08-19 DIAGNOSIS — N179 Acute kidney failure, unspecified: Secondary | ICD-10-CM | POA: Diagnosis present

## 2021-08-19 DIAGNOSIS — E7849 Other hyperlipidemia: Secondary | ICD-10-CM

## 2021-08-19 DIAGNOSIS — N1832 Chronic kidney disease, stage 3b: Secondary | ICD-10-CM | POA: Diagnosis present

## 2021-08-19 DIAGNOSIS — I10 Essential (primary) hypertension: Secondary | ICD-10-CM | POA: Diagnosis not present

## 2021-08-19 DIAGNOSIS — Z933 Colostomy status: Secondary | ICD-10-CM

## 2021-08-19 DIAGNOSIS — I129 Hypertensive chronic kidney disease with stage 1 through stage 4 chronic kidney disease, or unspecified chronic kidney disease: Secondary | ICD-10-CM | POA: Diagnosis present

## 2021-08-19 DIAGNOSIS — Z9181 History of falling: Secondary | ICD-10-CM | POA: Diagnosis not present

## 2021-08-19 DIAGNOSIS — N183 Chronic kidney disease, stage 3 unspecified: Secondary | ICD-10-CM

## 2021-08-19 LAB — CBC
HCT: 31.1 % — ABNORMAL LOW (ref 39.0–52.0)
Hemoglobin: 10.3 g/dL — ABNORMAL LOW (ref 13.0–17.0)
MCH: 28.3 pg (ref 26.0–34.0)
MCHC: 33.1 g/dL (ref 30.0–36.0)
MCV: 85.4 fL (ref 80.0–100.0)
Platelets: 189 10*3/uL (ref 150–400)
RBC: 3.64 MIL/uL — ABNORMAL LOW (ref 4.22–5.81)
RDW: 13.4 % (ref 11.5–15.5)
WBC: 6.6 10*3/uL (ref 4.0–10.5)
nRBC: 0 % (ref 0.0–0.2)

## 2021-08-19 LAB — BASIC METABOLIC PANEL
Anion gap: 8 (ref 5–15)
BUN: 42 mg/dL — ABNORMAL HIGH (ref 8–23)
CO2: 25 mmol/L (ref 22–32)
Calcium: 8.7 mg/dL — ABNORMAL LOW (ref 8.9–10.3)
Chloride: 107 mmol/L (ref 98–111)
Creatinine, Ser: 1.97 mg/dL — ABNORMAL HIGH (ref 0.61–1.24)
GFR, Estimated: 35 mL/min — ABNORMAL LOW (ref >60)
Glucose, Bld: 125 mg/dL — ABNORMAL HIGH (ref 70–99)
Potassium: 3.8 mmol/L (ref 3.5–5.1)
Sodium: 140 mmol/L (ref 135–145)

## 2021-08-19 LAB — MAGNESIUM: Magnesium: 2.3 mg/dL (ref 1.7–2.4)

## 2021-08-19 MED ORDER — LORATADINE 10 MG PO TABS
10.0000 mg | ORAL_TABLET | Freq: Every day | ORAL | Status: DC
  Administered 2021-08-19 – 2021-08-30 (×12): 10 mg via ORAL
  Filled 2021-08-19 (×12): qty 1

## 2021-08-19 MED ORDER — CLOPIDOGREL BISULFATE 75 MG PO TABS: 75.0000 mg | ORAL_TABLET | Freq: Every day | ORAL | 0 refills | Status: DC

## 2021-08-19 MED ORDER — ACETAMINOPHEN 325 MG PO TABS
325.0000 mg | ORAL_TABLET | ORAL | Status: DC | PRN
  Administered 2021-08-22 – 2021-08-28 (×15): 650 mg via ORAL
  Filled 2021-08-19 (×16): qty 2

## 2021-08-19 MED ORDER — GUAIFENESIN-DM 100-10 MG/5ML PO SYRP: 5.0000 mL | ORAL_SOLUTION | Freq: Four times a day (QID) | ORAL | Status: DC | PRN

## 2021-08-19 MED ORDER — TRAZODONE HCL 50 MG PO TABS
25.0000 mg | ORAL_TABLET | Freq: Every evening | ORAL | Status: DC | PRN
  Filled 2021-08-19 (×2): qty 1

## 2021-08-19 MED ORDER — TAMSULOSIN HCL 0.4 MG PO CAPS
0.8000 mg | ORAL_CAPSULE | Freq: Every day | ORAL | Status: DC
Start: 2021-08-19 — End: 2021-08-22
  Administered 2021-08-19 – 2021-08-21 (×3): 0.8 mg via ORAL
  Filled 2021-08-19 (×3): qty 2

## 2021-08-19 MED ORDER — POLYETHYLENE GLYCOL 3350 17 G PO PACK: 17.0000 g | PACK | Freq: Every day | ORAL | Status: DC | PRN

## 2021-08-19 MED ORDER — PROCHLORPERAZINE MALEATE 5 MG PO TABS: 5.0000 mg | ORAL_TABLET | Freq: Four times a day (QID) | ORAL | Status: DC | PRN

## 2021-08-19 MED ORDER — ENOXAPARIN SODIUM 40 MG/0.4ML IJ SOSY
40.0000 mg | PREFILLED_SYRINGE | INTRAMUSCULAR | Status: DC
  Administered 2021-08-19 – 2021-08-29 (×11): 40 mg via SUBCUTANEOUS
  Filled 2021-08-19 (×11): qty 0.4

## 2021-08-19 MED ORDER — ONDANSETRON 4 MG PO TBDP
4.0000 mg | ORAL_TABLET | Freq: Three times a day (TID) | ORAL | Status: DC | PRN
Start: 2021-08-19 — End: 2021-08-30

## 2021-08-19 MED ORDER — PROCHLORPERAZINE 25 MG RE SUPP: 12.5000 mg | Freq: Four times a day (QID) | RECTAL | Status: DC | PRN

## 2021-08-19 MED ORDER — ASPIRIN EC 81 MG PO TBEC
81.0000 mg | DELAYED_RELEASE_TABLET | Freq: Every day | ORAL | Status: DC
  Administered 2021-08-19 – 2021-08-30 (×12): 81 mg via ORAL
  Filled 2021-08-19 (×12): qty 1

## 2021-08-19 MED ORDER — ALUM & MAG HYDROXIDE-SIMETH 200-200-20 MG/5ML PO SUSP: 30.0000 mL | ORAL | Status: DC | PRN

## 2021-08-19 MED ORDER — ATORVASTATIN CALCIUM 80 MG PO TABS
80.0000 mg | ORAL_TABLET | Freq: Every day | ORAL | Status: DC
  Administered 2021-08-19 – 2021-08-30 (×12): 80 mg via ORAL
  Filled 2021-08-19 (×12): qty 1

## 2021-08-19 MED ORDER — FLEET ENEMA 7-19 GM/118ML RE ENEM: 1.0000 | ENEMA | Freq: Once | RECTAL | Status: DC | PRN

## 2021-08-19 MED ORDER — DOCUSATE SODIUM 100 MG PO CAPS: 100.0000 mg | ORAL_CAPSULE | Freq: Two times a day (BID) | ORAL | Status: DC | PRN

## 2021-08-19 MED ORDER — AMLODIPINE BESYLATE 5 MG PO TABS
5.0000 mg | ORAL_TABLET | Freq: Every day | ORAL | Status: DC
Start: 2021-08-19 — End: 2021-08-22
  Administered 2021-08-19 – 2021-08-22 (×4): 5 mg via ORAL
  Filled 2021-08-19 (×4): qty 1

## 2021-08-19 MED ORDER — CLOPIDOGREL BISULFATE 75 MG PO TABS
75.0000 mg | ORAL_TABLET | Freq: Every day | ORAL | Status: DC
  Administered 2021-08-19 – 2021-08-30 (×12): 75 mg via ORAL
  Filled 2021-08-19 (×12): qty 1

## 2021-08-19 MED ORDER — ASPIRIN 81 MG PO TBEC: 81.0000 mg | DELAYED_RELEASE_TABLET | Freq: Every day | ORAL | 0 refills | Status: DC

## 2021-08-19 MED ORDER — PROCHLORPERAZINE EDISYLATE 10 MG/2ML IJ SOLN: 5.0000 mg | Freq: Four times a day (QID) | INTRAMUSCULAR | Status: DC | PRN

## 2021-08-19 MED ORDER — CARBIDOPA-LEVODOPA 25-100 MG PO TABS
2.0000 | ORAL_TABLET | Freq: Four times a day (QID) | ORAL | Status: DC
  Administered 2021-08-19 – 2021-08-30 (×42): 2 via ORAL
  Filled 2021-08-19 (×44): qty 2

## 2021-08-19 MED ORDER — BISACODYL 10 MG RE SUPP: 10.0000 mg | Freq: Every day | RECTAL | Status: DC | PRN

## 2021-08-19 MED ORDER — ATORVASTATIN CALCIUM 80 MG PO TABS: 80.0000 mg | ORAL_TABLET | Freq: Every day | ORAL | 0 refills | Status: DC

## 2021-08-19 MED ORDER — CARBIDOPA-LEVODOPA 25-100 MG PO TABS
2.0000 | ORAL_TABLET | Freq: Four times a day (QID) | ORAL | Status: DC
  Administered 2021-08-19: 2 via ORAL
  Filled 2021-08-19: qty 2

## 2021-08-19 MED ORDER — POLYETHYLENE GLYCOL 3350 17 G PO PACK
17.0000 g | PACK | Freq: Two times a day (BID) | ORAL | Status: DC | PRN
Start: 2021-08-19 — End: 2021-08-22

## 2021-08-19 MED ORDER — FINASTERIDE 5 MG PO TABS
5.0000 mg | ORAL_TABLET | Freq: Every day | ORAL | Status: DC
  Administered 2021-08-20 – 2021-08-30 (×11): 5 mg via ORAL
  Filled 2021-08-19 (×12): qty 1

## 2021-08-19 MED ORDER — DIPHENHYDRAMINE HCL 12.5 MG/5ML PO ELIX: 12.5000 mg | ORAL_SOLUTION | Freq: Four times a day (QID) | ORAL | Status: DC | PRN

## 2021-08-19 NOTE — Progress Notes (Addendum)
Inpatient Rehab Admissions Coordinator:  There is a bed available for pt to admit to CIR today. Dr. Jimmye Norman is aware and in agreement. Pt, pt's wife, TOC, and NSG also aware.   Gayland Curry, [place], [place] Admissions Coordinator [phone]

## 2021-08-19 NOTE — Progress Notes (Signed)
Occupational Therapy Treatment Patient Details Name: Justin Mann MRN: [id number] DOB: 1945/10/04 Today's Date: 08/19/2021   History of present illness Pt is a 76 y.o. male who presents to the ED after reporting he became acutely weak around 9:30pm on 2/20, causing him to fall. 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 PmHx: Parkinson's Disease, DM, HTN, LE Neuropathy, colostomy bag.   OT comments  Mr Wimer was seen for OT treatment on this date. Upon arrival to room pt reclined in bed, agreeable to tx. Pt requires MIN A + HHA for ADL t/f, ~10 ft, pt reaches out for support on furniture, improved RLE clearance with cues. Pt tolerated seated therex as described below. Pt making good progress toward goals. Pt continues to benefit from skilled OT services to maximize return to PLOF and minimize risk of future falls, injury, caregiver burden, and readmission. Will continue to follow POC. Discharge recommendation remains appropriate.     Recommendations for follow up therapy are one component of a multi-disciplinary discharge planning process, led by the attending physician.  Recommendations may be updated based on patient status, additional functional criteria and insurance authorization.    Follow Up Recommendations  Acute inpatient rehab (3hours/day)    Assistance Recommended at Discharge Frequent or constant Supervision/Assistance  Patient can return home with the following  Two people to help with walking and/or transfers;Two people to help with bathing/dressing/bathroom;Help with stairs or ramp for entrance;Assistance with cooking/housework   Equipment Recommendations  BSC/3in1    Recommendations for Other Services      Precautions / Restrictions Precautions Precautions: Fall Restrictions Weight Bearing Restrictions: No       Mobility Bed Mobility Overal bed mobility: Needs Assistance Bed Mobility: Supine to Sit     Supine to sit: Min assist           Transfers Overall transfer level: Needs assistance Equipment used: 1 person hand held assist Transfers: Sit to/from Stand Sit to Stand: Min assist, From elevated surface           General transfer comment: reaches for BUE support on furniture, cues for RLE clearance ~10 ft mobility     Balance Overall balance assessment: Needs assistance Sitting-balance support: No upper extremity supported, Feet supported Sitting balance-Leahy Scale: Fair   Postural control: Right lateral lean Standing balance support: During functional activity, Single extremity supported Standing balance-Leahy Scale: Fair                             ADL either performed or assessed with clinical judgement   ADL Overall ADL's : Needs assistance/impaired                                       General ADL Comments: MIN A + HHA for ADL t/f, reaches out for support on furniture      Cognition Arousal/Alertness: Awake/alert Behavior During Therapy: WFL for tasks assessed/performed Overall Cognitive Status: Within Functional Limits for tasks assessed                                 General Comments: states he does not recall HEP from prior day        Exercises Other Exercises Other Exercises: 1 set x 10 reps each BLE knee extension, marches, and chair push ups  Pertinent Vitals/ Pain       Pain Assessment Pain Assessment: 0-10 Pain Score: 5  Pain Location: R shoulder Pain Descriptors / Indicators: Discomfort, Grimacing, Dull Pain Intervention(s): Limited activity within patient's tolerance, Repositioned   Frequency  Min 5X/week        Progress Toward Goals  OT Goals(current goals can now be found in the care plan section)  Progress towards OT goals: Progressing toward goals  Acute Rehab OT Goals Patient Stated Goal: to return to PLOF OT Goal Formulation: With patient/family Time For Goal Achievement: 08/30/21 Potential to  Achieve Goals: Good ADL Goals Pt Will Perform Grooming: with set-up;with supervision;standing Pt Will Perform Upper Body Dressing: with modified independence;sitting Pt Will Perform Lower Body Dressing: with min assist;sit to/from stand Pt Will Transfer to Toilet: with min guard assist;ambulating;bedside commode  Plan Discharge plan remains appropriate;Frequency remains appropriate    Co-evaluation                 AM-PAC OT "6 Clicks" Daily Activity     Outcome Measure   Help from another person eating meals?: A Little Help from another person taking care of personal grooming?: A Lot Help from another person toileting, which includes using toliet, bedpan, or urinal?: A Lot Help from another person bathing (including washing, rinsing, drying)?: A Lot Help from another person to put on and taking off regular upper body clothing?: A Little Help from another person to put on and taking off regular lower body clothing?: A Little 6 Click Score: 15    End of Session Equipment Utilized During Treatment: Gait belt  OT Visit Diagnosis: Other abnormalities of gait and mobility (R26.89);Muscle weakness (generalized) (M62.81)   Activity Tolerance Patient tolerated treatment well   Patient Left in chair;with call bell/phone within reach;with chair alarm set   Nurse Communication          Time: [id number] OT Time Calculation (min): 17 min  Charges: OT General Charges $OT Visit: 1 Visit OT Treatments $Therapeutic Exercise: 8-22 mins  Dessie Coma, M.S. OTR/L  08/19/21, 10:10 AM  ascom [phone]

## 2021-08-19 NOTE — Progress Notes (Signed)
Inpatient Rehabilitation Admission Medication Review by a Pharmacist  A complete drug regimen review was completed for this patient to identify any potential clinically significant medication issues.  High Risk Drug Classes Is patient taking? Indication by Medication  Antipsychotic Yes Compazine- N/V  Anticoagulant Yes Lovenox- VTE prophylaxis  Antibiotic No   Opioid No   Antiplatelet Yes Aspirin/plavix- CVA prophylaxis  Hypoglycemics/insulin No   Vasoactive Medication Yes Norvasc- hypertension Tamsulosin- BPH  Chemotherapy No   Other Yes Lipitor- HLD Claritin- allergies Proscar, tamsulosin- BPH Sinemet- parkinson's Trazodone- sleep     Type of Medication Issue Identified Description of Issue Recommendation(s)  Drug Interaction(s) (clinically significant)     Duplicate Therapy     Allergy     No Medication Administration End Date  Aspirin Neurology is recommending DAPT x3 weeks followed-by plavix monotherapy. Aspirin end date is: September 05, 2021   Incorrect Dose     Additional Drug Therapy Needed     Significant med changes from prior encounter (inform family/care partners about these prior to discharge).    Other  PTA meds Vitamin D-3 Lisinopril Aleve Restart PTA meds when and if clinically necessary during CIR admission or at time of discharge if warranted at that time    Clinically significant medication issues were identified that warrant physician communication and completion of prescribed/recommended actions by midnight of the next day:  No   Time spent performing this drug regimen review (minutes):  30   Justin Mann BS, PharmD, BCPS Clinical Pharmacist 08/19/2021 2:39 PM

## 2021-08-19 NOTE — Discharge Summary (Signed)
Physician Discharge Summary  Justin Mann IHK:[id number] DOB: 1946/06/07 DOA: 08/16/2021  PCP: [facility]  Admit date: 08/16/2021 Discharge date: 08/19/2021  Admitted From: home  Disposition:  CIR  Recommendations for Outpatient Follow-up:  Follow up with PCP in 1-2 weeks F/u w/ neuro in 1-2 weeks   Home Health: no  Equipment/Devices:  Discharge Condition: stable  CODE STATUS: full  Diet recommendation: Heart Healthy   Brief/Interim Summary: HPI was taken from Dr. Billie Ruddy: Justin Mann is a 76 y.o. male with medical history significant of Parkinson's, Hx of colon cancer s/p colostomy, HTN, CKD3b who presented with acute onset right-sided arm and leg weakness.    Pt went to bed around 19:30 last night, and woke up around 21:00 and found weakness in his right arm and leg, and couldn't walk.  Pt complained of right shoulder pain and said he may have fallen on it.  At baseline, pt uses a walker at night, and does fall sometimes, but usually just rests a bit and gets up.  Pt as Parkinson and neuropathy that affect his mobility.  Prior to the acute onset weakness, pt was at his baseline.     Pt and wife reported elevated BP to 160's at home while on home amlodipine and Lisinopril.  Pt does take a statin.  Pt had DM in the past, but became controlled after loosing weight from colon cancer.  No fever, dyspnea, chest pain, abdominal pain, N/V/D, dysuria.     ED Course: initial vitals: afebrile, pulse 67, BP 199/95, sating 95% on room air.  Labs notable for Cr 1.95 (baseline).  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  Pt had neuro tele consult in the ED.  Pt was out of window for thrombolytics.  Pt received ASA 324 mg in the ED and was admitted for full stroke workup.  As per Dr. Jimmye Norman 2/22-2/24/23: Pt was found to have acute CVA on admission as per MRI. MRA head/neck was w/o significant stenosis. Pt was treated w/ high  intensity statin, aspirin, plavix as per neuro. Pt will continue on aspirin and plavix for 3 weeks and then just continue plavix monotherapy after as per neuro. Echo showed EF 65-70%, no regional wall motion abnormalities, grade I diastolic dysfunction and no atrial level shunt was detected. Pt will need f/u outpatient w/ neuro. PT/OT evaluated the pt and recommend CIR. For more information, please see previous progress/consult notes.     Discharge Diagnoses:  Principal Problem:   Stroke (cerebrum) ([place]) Active Problems:   CVA (cerebral vascular accident) ([place])  Acute CVA: w/ small acute infarcts in the left parietal corona radiata and subcortical white matter as per MRI brain. MRA head/neck without significant stenosis. Continue on statin, aspirin & plavix. Echo shows EF 65-70%, no regional wall motion abnormalities, grade I diastolic dysfunction & no atrial level shunt was detected. Neuro recs apprec    HTN: continue on amlodipine & restart lisinopril at d/c     HLD: continue on statin    Parkinson: continue on home dose of sinemet    Hx of colon cancer: s/p colostomy. Continue w/ supportive care   DM2: HbA1c 6.3, well controlled. 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 Avoid nephrotoxic meds    Discharge Instructions  Discharge Instructions     Diet - low sodium heart healthy   Complete by: As directed    Discharge  instructions   Complete by: As directed    F/u w/ PCP in 1-2 weeks. F/u w/ neurology in 1-2 weeks. Take aspirin 81 mg daily and plavix 75mg  daily for 21 days and then stop aspirin and just continue plavix as per neurology.   Increase activity slowly   Complete by: As directed       Allergies as of 08/19/2021       Reactions   Penicillins Hives   Erythromycin Rash        Medication List     STOP taking these medications    naproxen sodium 220 MG tablet Commonly known as: ALEVE       TAKE these medications     amLODipine 10 MG tablet Commonly known as: NORVASC Take 5 mg by mouth daily.   aspirin 81 MG EC tablet Take 1 tablet (81 mg total) by mouth daily for 21 days. Swallow whole.   atorvastatin 80 MG tablet Commonly known as: LIPITOR Take 1 tablet (80 mg total) by mouth daily. What changed:  medication strength how much to take   carbidopa-levodopa 25-100 MG tablet Commonly known as: SINEMET IR Take 2 tablets by mouth in the morning, at noon, in the evening, and at bedtime. 0500, 1000, 1400, Bedtime   cholecalciferol 25 MCG (1000 UNIT) tablet Commonly known as: VITAMIN D3 Take 1,000 Units by mouth daily.   clopidogrel 75 MG tablet Commonly known as: PLAVIX Take 1 tablet (75 mg total) by mouth daily.   finasteride 5 MG tablet Commonly known as: PROSCAR Take 5 mg by mouth daily.   lisinopril 20 MG tablet Commonly known as: ZESTRIL Take 20 mg by mouth daily.   tamsulosin 0.4 MG Caps capsule Commonly known as: FLOMAX Take 0.8 mg by mouth at bedtime.   VITAMIN B-12 PO Take 1 tablet by mouth daily.        Allergies  Allergen Reactions   Penicillins Hives   Erythromycin Rash    Consultations: Neuro    Procedures/Studies: DG Shoulder Right  Result Date: 08/16/2021 CLINICAL DATA:  Fall with right shoulder pain EXAM: RIGHT SHOULDER - 3 IEW COMPARISON:  None. FINDINGS: There is no evidence of fracture or dislocation. There is no evidence of arthropathy or other focal bone abnormality. Soft tissues are unremarkable. Calcified right pulmonary nodules. IMPRESSION: Negative for fracture or dislocation. Electronically Signed   By: Jorje Guild M.D.   On: 08/16/2021 05:31   CT HEAD WO CONTRAST  Result Date: 08/16/2021 CLINICAL DATA:  Neuro deficit, acute, stroke suspected right arm and leg weakness EXAM: CT HEAD WITHOUT CONTRAST TECHNIQUE: Contiguous axial images were obtained from the base of the skull through the vertex without intravenous contrast. RADIATION DOSE  REDUCTION: This exam was performed according to the departmental dose-optimization program which includes automated exposure control, adjustment of the mA and/or kV according to patient size and/or use of iterative reconstruction technique. COMPARISON:  Brain MRI 10/07/2020 FINDINGS: Brain: No acute intracranial hemorrhage. Small recurrent felt to in the right thalamus, not definitively seen on prior MRI. No evidence of territorial or large vessel ischemia. Stable brain volume with atrophy and ventriculomegaly. Stable periventricular and deep chronic small vessel ischemia, allowing for differences in modality. No midline shift or mass effect. No subdural or extra-axial collection. Vascular: Atherosclerosis of skullbase vasculature without hyperdense vessel or abnormal calcification. Skull: No fracture or focal lesion. Sinuses/Orbits: Occasional mucosal thickening of ethmoid air cells. Bilateral lens extraction. Mastoid air cells are clear. Other: None. IMPRESSION: 1. Small lacunar  infarct in the right thalamus, age indeterminate, but not definitively seen on prior MRI. 2. Stable atrophy and chronic small vessel ischemia. Electronically Signed   By: Keith Rake M.D.   On: 08/16/2021 00:52   CT Cervical Spine Wo Contrast  Result Date: 08/16/2021 CLINICAL DATA:  76 year old male with history of neck trauma. EXAM: CT CERVICAL SPINE WITHOUT CONTRAST TECHNIQUE: Multidetector CT imaging of the cervical spine was performed without intravenous contrast. Multiplanar CT image reconstructions were also generated. RADIATION DOSE REDUCTION: This exam was performed according to the departmental dose-optimization program which includes automated exposure control, adjustment of the mA and/or kV according to patient size and/or use of iterative reconstruction technique. COMPARISON:  No priors. FINDINGS: Alignment: Normal. Skull base and vertebrae: No acute fracture. No primary bone lesion or focal pathologic process. Soft  tissues and spinal canal: No prevertebral fluid or swelling. No visible canal hematoma. Disc levels: Multilevel degenerative disc disease, most evident at C5-C6 and C6-C7. Mild multilevel facet arthropathy. Upper chest: Unremarkable. Other: None. IMPRESSION: 1. No evidence of significant acute traumatic injury to the cervical spine. 2. Mild multilevel degenerative disc disease and cervical spondylosis, as above. Electronically Signed   By: Vinnie Langton M.D.   On: 08/16/2021 06:12   MR ANGIO HEAD WO CONTRAST  Result Date: 08/16/2021 CLINICAL DATA:  Neuro deficit with acute stroke suspected EXAM: MRA HEAD WITHOUT CONTRAST TECHNIQUE: Angiographic images of the Circle of Willis were acquired using MRA technique without intravenous contrast. COMPARISON:  Brain MRI from earlier the same day FINDINGS: Anterior circulation: Typical of ICA artifact appearance at the skull base. No branch occlusion, beading, or aneurysm. No significant stenosis. Posterior circulation: The basilar and covered vertebral arteries are smoothly contoured and widely patent. No branch occlusion, beading, or aneurysm. IMPRESSION: Negative intracranial MRA. Electronically Signed   By: Jorje Guild M.D.   On: 08/16/2021 06:02   MR ANGIO NECK WO CONTRAST  Result Date: 08/16/2021 CLINICAL DATA:  Neuro deficit with acute stroke suspected EXAM: MRA NECK WITHOUT CONTRAST TECHNIQUE: Angiographic images of the neck were acquired using MRA technique without intravenous contrast. Carotid stenosis measurements (when applicable) are obtained utilizing NASCET criteria, using the distal internal carotid diameter as the denominator. COMPARISON:  Brain MRI from earlier today FINDINGS: Aortic arch: Partial coverage is negative. Right carotid system: Mild flow artifact at the proximal ICA. No flow reducing stenosis or visible dissection. Left carotid system: Vessel tortuosity. No stenosis or worrisome vessel irregularity. Vertebral arteries: No proximal  subclavian stenosis. Codominant vertebral arteries which are smoothly contoured and widely patent to the dura. IMPRESSION: Unremarkable major vessels in the neck. No stenosis or embolic source seen. Electronically Signed   By: Jorje Guild M.D.   On: 08/16/2021 06:00   MR BRAIN WO CONTRAST  Result Date: 08/16/2021 CLINICAL DATA:  Stroke suspected, right arm and leg weakness EXAM: MRI HEAD WITHOUT CONTRAST TECHNIQUE: Multiplanar, multiecho pulse sequences of the brain and surrounding structures were obtained without intravenous contrast. COMPARISON:  10/07/2020 MRI, correlation is also made with 08/16/2021 CT FINDINGS: Brain: Small area of restricted diffusion with ADC correlate in the left parietal corona radiata and subcortical white matter (series 5, images 37-40 and series 6, images 37-40). No acute hemorrhage, mass mass effect, or midline shift. No hydrocephalus or extra-axial collection. Lacunar infarcts in the left pons, bilateral basal ganglia, and left corona radiata. Confluent T2 hyperintense signal in the periventricular white matter and pons, likely the sequela of moderate chronic small vessel ischemic disease. Central cerebral  atrophy is somewhat advanced for age. Vascular: Normal flow voids. Skull and upper cervical spine: Normal marrow signal. Sinuses/Orbits: Negative.  Status post bilateral lens replacements. Other: None. IMPRESSION: Small acute infarcts in the left parietal corona radiata and subcortical white matter. Electronically Signed   By: Merilyn Baba M.D.   On: 08/16/2021 02:29   ECHOCARDIOGRAM COMPLETE  Result Date: 08/17/2021    ECHOCARDIOGRAM REPORT   Patient Name:   Justin Mann Date of Exam: 08/17/2021 Medical Rec #:  [id number]   Height:       70.0 in Accession #:    [id number]  Weight:       200.0 lb Date of Birth:  [birth year]    BSA:          2.087 m Patient Age:    11 years    BP:           174/93 mmHg Patient Gender: M           HR:           67 bpm. Exam Location:  [facility]  Procedure: 2D Echo, Cardiac Doppler and Color Doppler Indications:     Stroke I63.9  History:         Patient has no prior history of Echocardiogram examinations.                  Risk Factors:Diabetes and Hypertension.  Sonographer:     Sherrie Sport Referring Phys:  [id number] [place] Diagnosing Phys: Yolonda Kida MD  Sonographer Comments: Suboptimal parasternal window. IMPRESSIONS  1. Left ventricular ejection fraction, by estimation, is 65 to 70%. The left ventricle has normal function. The left ventricle has no regional wall motion abnormalities. The left ventricular internal cavity size was mildly dilated. Left ventricular diastolic parameters are consistent with Grade I diastolic dysfunction (impaired relaxation).  2. Right ventricular systolic function is normal. The right ventricular size is normal.  3. The mitral valve is normal in structure. Mild mitral valve regurgitation.  4. The aortic valve is normal in structure. Aortic valve regurgitation is not visualized. FINDINGS  Left Ventricle: Left ventricular ejection fraction, by estimation, is 65 to 70%. The left ventricle has normal function. The left ventricle has no regional wall motion abnormalities. The left ventricular internal cavity size was mildly dilated. There is  no left ventricular hypertrophy. Left ventricular diastolic parameters are consistent with Grade I diastolic dysfunction (impaired relaxation). Right Ventricle: The right ventricular size is normal. No increase in right ventricular wall thickness. Right ventricular systolic function is normal. Left Atrium: Left atrial size was normal in size. Right Atrium: Right atrial size was normal in size. Pericardium: There is no evidence of pericardial effusion. Mitral Valve: The mitral valve is normal in structure. Mild mitral valve regurgitation. MV peak gradient, 3.7 mmHg. The mean mitral valve gradient is 2.0 mmHg. Tricuspid Valve: The tricuspid valve is normal in structure. Tricuspid valve  regurgitation is trivial. Aortic Valve: The aortic valve is normal in structure. Aortic valve regurgitation is not visualized. Aortic valve mean gradient measures 10.7 mmHg. Aortic valve peak gradient measures 17.7 mmHg. Aortic valve area, by VTI measures 1.74 cm. Pulmonic Valve: The pulmonic valve was grossly normal. Pulmonic valve regurgitation is not visualized. Aorta: The ascending aorta was not well visualized. IAS/Shunts: No atrial level shunt detected by color flow Doppler.  LEFT VENTRICLE PLAX 2D LVIDd:         5.10 cm   Diastology LVIDs:  3.00 cm   LV e' medial:    3.26 cm/s LV PW:         1.60 cm   LV E/e' medial:  17.4 LV IVS:        1.15 cm   LV e' lateral:   5.98 cm/s LVOT diam:     2.00 cm   LV E/e' lateral: 9.5 LV SV:         82 LV SV Index:   39 LVOT Area:     3.14 cm  RIGHT VENTRICLE RV Basal diam:  3.60 cm RV S prime:     17.20 cm/s TAPSE (M-mode): 3.2 cm LEFT ATRIUM             Index        RIGHT ATRIUM           Index LA diam:        3.50 cm 1.68 cm/m   RA Area:     17.10 cm LA Vol (A2C):   48.2 ml 23.09 ml/m  RA Volume:   44.40 ml  21.27 ml/m LA Vol (A4C):   32.4 ml 15.52 ml/m LA Biplane Vol: 39.5 ml 18.92 ml/m  AORTIC VALVE                     PULMONIC VALVE AV Area (Vmax):    1.70 cm      PV Vmax:        1.20 m/s AV Area (Vmean):   1.76 cm      PV Vmean:       76.700 cm/s AV Area (VTI):     1.74 cm      PV VTI:         0.213 m AV Vmax:           210.33 cm/s   PV Peak grad:   5.8 mmHg AV Vmean:          153.333 cm/s  PV Mean grad:   3.0 mmHg AV VTI:            0.471 m       RVOT Peak grad: 9 mmHg AV Peak Grad:      17.7 mmHg AV Mean Grad:      10.7 mmHg LVOT Vmax:         114.00 cm/s LVOT Vmean:        85.800 cm/s LVOT VTI:          0.261 m LVOT/AV VTI ratio: 0.55  AORTA Ao Root diam: 4.00 cm MITRAL VALVE               TRICUSPID VALVE MV Area (PHT): 2.72 cm    TR Peak grad:   12.2 mmHg MV Area VTI:   3.78 cm    TR Vmax:        175.00 cm/s MV Peak grad:  3.7 mmHg MV Mean  grad:  2.0 mmHg    SHUNTS MV Vmax:       0.96 m/s    Systemic VTI:  0.26 m MV Vmean:      65.9 cm/s   Systemic Diam: 2.00 cm MV Decel Time: 279 msec    Pulmonic VTI:  0.277 m MV E velocity: 56.60 cm/s MV A velocity: 96.00 cm/s MV E/A ratio:  0.59 Dwayne D Callwood MD Electronically signed by Yolonda Kida MD Signature Date/Time: 08/17/2021/4:48:07 PM    Final    (Echo, Carotid, EGD, Colonoscopy, ERCP)  Subjective:   Discharge Exam: Vitals:   08/19/21 0829 08/19/21 0831  BP: (!) 171/82 (!) 181/82  Pulse: 62 (!) 58  Resp: 16   Temp: 97.7 F (36.5 C)   SpO2: 95%    Vitals:   08/19/21 0020 08/19/21 0500 08/19/21 0829 08/19/21 0831  BP: (!) 173/77 (!) 180/83 (!) 171/82 (!) 181/82  Pulse: (!) 58 (!) 59 62 (!) 58  Resp: 16 18 16    Temp: 98.2 F (36.8 C) 98.2 F (36.8 C) 97.7 F (36.5 C)   TempSrc: Oral Oral Oral   SpO2: 96% 97% 95%   Weight:      Height:        General: Pt is alert, awake, not in acute distress Cardiovascular: S1/S2 +, no rubs, no gallops Respiratory: CTA bilaterally, no wheezing, no rhonchi Abdominal: Soft, NT, ND, bowel sounds + Extremities: no edema, no cyanosis    The results of significant diagnostics from this hospitalization (including imaging, microbiology, ancillary and laboratory) are listed below for reference.     Microbiology: Recent Results (from the past 240 hour(s))  Resp Panel by RT-PCR (Flu A&B, Covid) Nasopharyngeal Swab     Status: None   Collection Time: 08/16/21  2:43 AM   Specimen: Nasopharyngeal Swab; Nasopharyngeal(NP) swabs in vial transport medium  Result Value Ref Range Status   SARS Coronavirus 2 by RT PCR NEGATIVE NEGATIVE Final    Comment: (NOTE) SARS-CoV-2 target nucleic acids are NOT DETECTED.  The SARS-CoV-2 RNA is generally detectable in upper respiratory specimens during the acute phase of infection. The lowest concentration of SARS-CoV-2 viral copies this assay can detect is 138 copies/mL. A negative result  does not preclude SARS-Cov-2 infection and should not be used as the sole basis for treatment or other patient management decisions. A negative result may occur with  improper specimen collection/handling, submission of specimen other than nasopharyngeal swab, presence of viral mutation(s) within the areas targeted by this assay, and inadequate number of viral copies(<138 copies/mL). A negative result must be combined with clinical observations, patient history, and epidemiological information. The expected result is Negative.  Fact Sheet for Patients:  EntrepreneurPulse.com.au  Fact Sheet for Healthcare Providers:  IncredibleEmployment.be  This test is no t yet approved or cleared by the [place] FDA and  has been authorized for detection and/or diagnosis of SARS-CoV-2 by FDA under an Emergency Use Authorization (EUA). This EUA will remain  in effect (meaning this test can be used) for the duration of the COVID-19 declaration under Section 564(b)(1) of the Act, 21 U.S.C.section 360bbb-3(b)(1), unless the authorization is terminated  or revoked sooner.       Influenza A by PCR NEGATIVE NEGATIVE Final   Influenza B by PCR NEGATIVE NEGATIVE Final    Comment: (NOTE) The Xpert Xpress SARS-CoV-2/FLU/RSV plus assay is intended as an aid in the diagnosis of influenza from Nasopharyngeal swab specimens and should not be used as a sole basis for treatment. Nasal washings and aspirates are unacceptable for Xpert Xpress SARS-CoV-2/FLU/RSV testing.  Fact Sheet for Patients: EntrepreneurPulse.com.au  Fact Sheet for Healthcare Providers: IncredibleEmployment.be  This test is not yet approved or cleared by the [place] FDA and has been authorized for detection and/or diagnosis of SARS-CoV-2 by FDA under an Emergency Use Authorization (EUA). This EUA will remain in effect (meaning this test can be used) for  the duration of the COVID-19 declaration under Section 564(b)(1) of the Act, 21 U.S.C. section 360bbb-3(b)(1), unless the authorization is terminated or revoked.  Performed at [facility], [address] [place], [place] [address]      Labs: BNP (last 3 results) No results for input(s): BNP in the last 8760 hours. Basic Metabolic Panel: Recent Labs  Lab 08/16/21 0024 08/17/21 0534 08/18/21 0450 08/19/21 0532  NA 138 139 139 140  K 3.7 3.7 3.8 3.8  CL 107 105 107 107  CO2 24 27 24 25   GLUCOSE 144* 135* 129* 125*  BUN 32* 31* 37* 42*  CREATININE 1.95* 2.07* 2.05* 1.97*  CALCIUM 8.7* 8.5* 8.9 8.7*  MG  --  2.0 2.1 2.3   Liver Function Tests: Recent Labs  Lab 08/16/21 0024  AST 19  ALT 5  ALKPHOS 63  BILITOT 1.0  PROT 7.3  ALBUMIN 3.6   No results for input(s): LIPASE, AMYLASE in the last 168 hours. No results for input(s): AMMONIA in the last 168 hours. CBC: Recent Labs  Lab 08/16/21 0024 08/17/21 0534 08/18/21 0450 08/19/21 0532  WBC 9.0 7.0 7.6 6.6  NEUTROABS 6.4  --   --   --   HGB 11.4* 10.5* 10.2* 10.3*  HCT 35.3* 32.3* 30.8* 31.1*  MCV 86.1 86.8 84.8 85.4  PLT 220 207 190 189   Cardiac Enzymes: No results for input(s): CKTOTAL, CKMB, CKMBINDEX, TROPONINI in the last 168 hours. BNP: Invalid input(s): POCBNP CBG: Recent Labs  Lab 08/16/21 0022  GLUCAP 144*   D-Dimer No results for input(s): DDIMER in the last 72 hours. Hgb A1c No results for input(s): HGBA1C in the last 72 hours. Lipid Profile No results for input(s): CHOL, HDL, LDLCALC, TRIG, CHOLHDL, LDLDIRECT in the last 72 hours. Thyroid function studies No results for input(s): TSH, T4TOTAL, T3FREE, THYROIDAB in the last 72 hours.  Invalid input(s): FREET3 Anemia work up No results for input(s): VITAMINB12, FOLATE, FERRITIN, TIBC, IRON, RETICCTPCT in the last 72 hours. Urinalysis    Component Value Date/Time   COLORURINE YELLOW (A) 08/16/2021 0612   APPEARANCEUR  CLOUDY (A) 08/16/2021 0612   LABSPEC 1.017 08/16/2021 0612   PHURINE 5.0 08/16/2021 0612   GLUCOSEU NEGATIVE 08/16/2021 0612   HGBUR SMALL (A) 08/16/2021 0612   BILIRUBINUR NEGATIVE 08/16/2021 0612   KETONESUR NEGATIVE 08/16/2021 0612   PROTEINUR >=300 (A) 08/16/2021 0612   NITRITE NEGATIVE 08/16/2021 0612   LEUKOCYTESUR SMALL (A) 08/16/2021 0612   Sepsis Labs Invalid input(s): PROCALCITONIN,  WBC,  LACTICIDVEN Microbiology Recent Results (from the past 240 hour(s))  Resp Panel by RT-PCR (Flu A&B, Covid) Nasopharyngeal Swab     Status: None   Collection Time: 08/16/21  2:43 AM   Specimen: Nasopharyngeal Swab; Nasopharyngeal(NP) swabs in vial transport medium  Result Value Ref Range Status   SARS Coronavirus 2 by RT PCR NEGATIVE NEGATIVE Final    Comment: (NOTE) SARS-CoV-2 target nucleic acids are NOT DETECTED.  The SARS-CoV-2 RNA is generally detectable in upper respiratory specimens during the acute phase of infection. The lowest concentration of SARS-CoV-2 viral copies this assay can detect is 138 copies/mL. A negative result does not preclude SARS-Cov-2 infection and should not be used as the sole basis for treatment or other patient management decisions. A negative result may occur with  improper specimen collection/handling, submission of specimen other than nasopharyngeal swab, presence of viral mutation(s) within the areas targeted by this assay, and inadequate number of viral copies(<138 copies/mL). A negative result must be combined with clinical observations, patient history, and epidemiological information. The expected result is Negative.  Fact Sheet for Patients:  EntrepreneurPulse.com.au  Fact Sheet for Healthcare Providers:  IncredibleEmployment.be  This test is no t yet approved or cleared by the [place] FDA and  has been authorized for detection and/or diagnosis of SARS-CoV-2 by FDA under an Emergency Use  Authorization (EUA). This EUA will remain  in effect (meaning this test can be used) for the duration of the COVID-19 declaration under Section 564(b)(1) of the Act, 21 U.S.C.section 360bbb-3(b)(1), unless the authorization is terminated  or revoked sooner.       Influenza A by PCR NEGATIVE NEGATIVE Final   Influenza B by PCR NEGATIVE NEGATIVE Final    Comment: (NOTE) The Xpert Xpress SARS-CoV-2/FLU/RSV plus assay is intended as an aid in the diagnosis of influenza from Nasopharyngeal swab specimens and should not be used as a sole basis for treatment. Nasal washings and aspirates are unacceptable for Xpert Xpress SARS-CoV-2/FLU/RSV testing.  Fact Sheet for Patients: EntrepreneurPulse.com.au  Fact Sheet for Healthcare Providers: IncredibleEmployment.be  This test is not yet approved or cleared by the [place] FDA and has been authorized for detection and/or diagnosis of SARS-CoV-2 by FDA under an Emergency Use Authorization (EUA). This EUA will remain in effect (meaning this test can be used) for the duration of the COVID-19 declaration under Section 564(b)(1) of the Act, 21 U.S.C. section [id number](b)(1), unless the authorization is terminated or revoked.  Performed at [facility], [address], [place], [place] [address]      Time coordinating discharge: Over 30 minutes  SIGNED:   Wyvonnia Dusky, MD  [facility] 08/19/2021, 9:44 AM Pager   If 7PM-7AM, please contact night-coverage

## 2021-08-19 NOTE — H&P (Signed)
Physical Medicine and Rehabilitation Admission H&P    Chief Complaint  Patient presents with   Functional deficits due to stroke.     HPI: Justin Mann. Wirick is a 76 year old male with history of HTN, PD ([facility]), CKD, rectal cancer s/p APR who was admitted on 08/16/20 with right sided weakness and difficulty walking. UDS  negative. 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  Right shoulder X rays were negative for fracture. 2D echo showed EF 65-70% with no wall abnormality and mild MVR. He has a history of falling.   Review of Systems  Musculoskeletal:  Positive for falls.    Past Medical History:  Diagnosis Date   Agent orange exposure    Diabetes mellitus without complication (HCC)    Hypertension    Neuropathy    Rectal cancer ([place])     Past Surgical History:  Procedure Laterality Date   ILEOSTOMY     TONSILLECTOMY      Family History  Problem Relation Age of Onset   Hypertension Father    Heart attack Father    Stroke Sister    Hypertension Sister     Social History: Married. Retired--used to work in [employer]. He reports that he has never smoked. He does not have any smokeless tobacco history on file. He reports current alcohol use- one drink every week. No history on file for drug use.   Allergies  Allergen Reactions   Penicillins Hives   Erythromycin Rash    Medications Prior to Admission  Medication Sig Dispense Refill   amLODipine (NORVASC) 10 MG tablet Take 5 mg by mouth daily.     aspirin EC 81 MG EC tablet Take 1 tablet (81 mg total) by mouth daily for 21 days. Swallow whole. 21 tablet 0   atorvastatin (LIPITOR) 80 MG tablet Take 1 tablet (80 mg total) by mouth daily. 30 tablet 0   carbidopa-levodopa (SINEMET IR) 25-100 MG tablet Take 2 tablets by mouth in the morning, at noon, in the evening, and at bedtime. 0500,  1000, 1400, Bedtime     cholecalciferol (VITAMIN D3) 25 MCG (1000 UNIT) tablet Take 1,000 Units by mouth daily.     clopidogrel (PLAVIX) 75 MG tablet Take 1 tablet (75 mg total) by mouth daily. 30 tablet 0   Cyanocobalamin (VITAMIN B-12 PO) Take 1 tablet by mouth daily.     finasteride (PROSCAR) 5 MG tablet Take 5 mg by mouth daily.     lisinopril (ZESTRIL) 20 MG tablet Take 20 mg by mouth daily.     tamsulosin (FLOMAX) 0.4 MG CAPS capsule Take 0.8 mg by mouth at bedtime.      Home: Home Living Family/patient expects to be discharged to:: Private residence Living Arrangements: Spouse/significant other Available Help at Discharge: Family, Available 24 hours/day Type of Home: House Home Access: Stairs to enter [employer] of Steps: 4 Entrance Stairs-Rails: Can reach both Home Layout: One level (down 2 small steps into den then up to [employer]) Bathroom Shower/Tub: Chiropodist: Standard Bathroom Accessibility: Yes Home Equipment: Civil engineer, contracting, Grab bars - tub/shower, Rollator (4 wheels)  Lives With: Spouse   Functional History: Prior Function Prior Level of Function : Independent/Modified Independent Mobility Comments: 4WW for nightime mobility. Pt endorses ~5 falls in last 3 months, states r/t Parkinsons ADLs Comments: kneels in front of toilet to empty colostomy bag.  Baseline Parkinsons tremors intermittently affect ADLs   Functional Status:  Mobility: Bed Mobility Overal bed mobility: Needs Assistance Bed Mobility: Supine to Sit Supine to sit: Min assist Sit to supine: Min guard Transfers Overall transfer level: Needs assistance Equipment used: 1 person hand held assist Transfers: Sit to/from Stand Sit to Stand: Min assist, From elevated surface General transfer comment: reaches for BUE support on furniture, cues for RLE clearance ~10 ft mobility Ambulation/Gait Ambulation/Gait assistance: Min guard, Min assist Gait Distance (Feet): 60 Feet (3  bouts of 46ft; PT led rest breaks due to fatigue) Assistive device: Rolling walker (2 wheels) Gait Pattern/deviations: Step-to pattern, Decreased step length - right, Decreased step length - left, Decreased weight shift to right, Decreased stride length, Trunk flexed General Gait Details: R LE step length deficit; with increased fatigue R LE lags behind and decreases BOS; constant verbal cueing about fully clearing R foot w/ each step. Gait velocity: decreased   ADL: ADL Overall ADL's : Needs assistance/impaired General ADL Comments: MIN A + HHA for ADL t/f, reaches out for support on furniture   Cognition: Cognition Overall Cognitive Status: Within Functional Limits for tasks assessed Orientation Level: Oriented X4 Cognition Arousal/Alertness: Awake/alert Behavior During Therapy: WFL for tasks assessed/performed Overall Cognitive Status: Within Functional Limits for tasks assessed General Comments: states he does not recall HEP from prior day     Blood pressure (!) 169/86, pulse 79, temperature 98.8 F (37.1 C), temperature source Oral, resp. rate (!) 21, height 5\' 10"  (1.778 m), weight 88.3 kg, SpO2 98 %. Physical Exam Gen: no distress, normal appearing HEENT: oral mucosa pink and moist, NCAT Cardio: Reg rate Chest: normal effort, normal rate of breathing Abd: soft, non-distended Ext: no edema Psych: pleasant, normal affect Skin: intact Neuro/MSK: postural tremor, sensation is intact, strength is intact.   Results for orders placed or performed during the hospital encounter of 08/16/21 (from the past 48 hour(s))  Basic metabolic panel     Status: Abnormal   Collection Time: 08/18/21  4:50 AM  Result Value Ref Range   Sodium 139 135 - 145 mmol/L   Potassium 3.8 3.5 - 5.1 mmol/L   Chloride 107 98 - 111 mmol/L   CO2 24 22 - 32 mmol/L   Glucose, Bld 129 (H) 70 - 99 mg/dL    Comment: Glucose reference range applies only to samples taken after fasting for at least 8 hours.    BUN 37 (H) 8 - 23 mg/dL   Creatinine, Ser 2.05 (H) 0.61 - 1.24 mg/dL   Calcium 8.9 8.9 - 10.3 mg/dL   GFR, Estimated 34 (L) >60 mL/min    Comment: (NOTE) Calculated using the CKD-EPI Creatinine Equation (2021)    Anion gap 8 5 - 15    Comment: Performed at [facility], [address] [place], [place] [address]  CBC     Status: Abnormal   Collection Time: 08/18/21  4:50 AM  Result Value Ref Range   WBC 7.6 4.0 - 10.5 K/uL   RBC 3.63 (L) 4.22 - 5.81 MIL/uL   Hemoglobin 10.2 (L) 13.0 - 17.0 g/dL   HCT 30.8 (L) 39.0 - 52.0 %   MCV 84.8 80.0 - 100.0 fL   MCH 28.1 26.0 - 34.0 pg   MCHC 33.1 30.0 - 36.0 g/dL   RDW 13.4 11.5 - 15.5 %   Platelets 190 150 - 400 K/uL   nRBC 0.0 0.0 - 0.2 %    Comment: Performed at [facility],  [place], [place] [address]  Magnesium     Status: None   Collection Time: 08/18/21  4:50 AM  Result Value Ref Range   Magnesium 2.1 1.7 - 2.4 mg/dL    Comment: Performed at [facility], [address] [place], [place] [address]  Basic metabolic panel     Status: Abnormal   Collection Time: 08/19/21  5:32 AM  Result Value Ref Range   Sodium 140 135 - 145 mmol/L   Potassium 3.8 3.5 - 5.1 mmol/L   Chloride 107 98 - 111 mmol/L   CO2 25 22 - 32 mmol/L   Glucose, Bld 125 (H) 70 - 99 mg/dL    Comment: Glucose reference range applies only to samples taken after fasting for at least 8 hours.   BUN 42 (H) 8 - 23 mg/dL   Creatinine, Ser 1.97 (H) 0.61 - 1.24 mg/dL   Calcium 8.7 (L) 8.9 - 10.3 mg/dL   GFR, Estimated 35 (L) >60 mL/min    Comment: (NOTE) Calculated using the CKD-EPI Creatinine Equation (2021)    Anion gap 8 5 - 15    Comment: Performed at [facility], [address] [place], [place] [address]  CBC     Status: Abnormal   Collection Time: 08/19/21  5:32 AM  Result Value Ref Range   WBC 6.6 4.0 - 10.5 K/uL   RBC 3.64 (L) 4.22 - 5.81 MIL/uL   Hemoglobin 10.3 (L) 13.0 - 17.0 g/dL   HCT 31.1 (L)  39.0 - 52.0 %   MCV 85.4 80.0 - 100.0 fL   MCH 28.3 26.0 - 34.0 pg   MCHC 33.1 30.0 - 36.0 g/dL   RDW 13.4 11.5 - 15.5 %   Platelets 189 150 - 400 K/uL   nRBC 0.0 0.0 - 0.2 %    Comment: Performed at [facility], [address], [place], [place] [address]  Magnesium     Status: None   Collection Time: 08/19/21  5:32 AM  Result Value Ref Range   Magnesium 2.3 1.7 - 2.4 mg/dL    Comment: Performed at [facility], [address] [place],  [address]   No results found.    Blood pressure (!) 169/86, pulse 79, temperature 98.8 F (37.1 C), temperature source Oral, resp. rate (!) 21, height 5\' 10"  (1.778 m), weight 88.3 kg, SpO2 98 %.  Medical Problem List and Plan: 1. 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  Antithrombotics: -DVT/anticoagulation:  Pharmaceutical: Lovenox  -antiplatelet therapy: DAPT X 3 weeks followed by Plavix 3. Pain Management: tylenol prn 4. Mood: Lcsw to follow for evaluation and support  -antipsychotic agents: N/A 5. Neuropsych: This patient is  capable of making decisions on his own behalf. 6. Skin/Wound Care: Routine pressure relief measures. 7. Fluids/Electrolytes/Nutrition: Monitor I/O.  8. HTN: Permissive HTN with slow titration of BP meds, continue amlodpipine 9. CKD: Avoid nephrotoxic meds and hypotensive episodes  --baseline SCr-1.95 to 2.08.  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 Nocturia: Gets up every 2 hours at nights. Has seen urology in the past. Continue flomax 0.8mg  HS.   --will use condom cath at nights to help with sleep.  12. Parkinson's Disease: continue Sinemet 25-100mg  2 tablets 4 times per day.   13. Insomnia: continue trazodone prn HS  I have personally performed a face to face diagnostic evaluation, including, but not limited  to relevant history and physical exam findings, of this patient and developed relevant  assessment and plan.  Additionally, I have reviewed and concur with the physician assistant's documentation above.  Izora Ribas, MD 08/19/2021   Bary Leriche, PA-C

## 2021-08-19 NOTE — Progress Notes (Signed)
Patient arrived to unit via stretcher with three attendees. Patient in good spirits and understands unit orientation. Sanda Linger, LPN

## 2021-08-19 NOTE — TOC Progression Note (Signed)
Transition of Care [facility]) - Progression Note    Patient Details  Name: Justin Mann MRN: [id number] Date of Birth: March 30, 1946  Transition of Care [facility]) CM/SW [place], RN Phone Number: 08/19/2021, 10:09 AM  Clinical Narrative:   Patient to transfer to CIR today at 1pm as per Lauren at [facility].  Care team aware of transfer.         Expected Discharge Plan and Services           Expected Discharge Date: 08/19/21                                     Social Determinants of Health (SDOH) Interventions    Readmission Risk Interventions No flowsheet data found.

## 2021-08-19 NOTE — Progress Notes (Addendum)
Signed                                                                                                                                                                                                                                                                                                                                                                                                                                                                                                  PMR Admission Coordinator Pre-Admission Assessment   Patient: Justin Mann is an 76 y.o., male MRN: [id number] DOB: 27-Mar-1946 Height: 5' 10" (177.8 cm) Weight: 90.7 kg   Insurance Information HMO:     PPO:      PCP:      IPA:      80/20:      OTHER:  PRIMARY: [place]      Policy#: [id number]      Subscriber: pt CM Name: Steva Ready      Phone#: [phone] ext [phone]     Fax#: [phone] Pre-Cert#/Referral #: [id number]  Approved for CIR starting 08/19/21    Employer:  Benefits:  Phone #: [phone]     Name: [employer] Effective Date: 11/22/2017 - still active Deductible: $0 OOP Max: $0 As long as Referral from ER was receive the following will be true:  CIR: 100% coverage, auth required SNF: 100% coverage, auth required Outpatient:  100% coverage, auth required Home Health:  100% coverage, auth required DME: 100% coverage, auth required Providers: in network  SECONDARY: [employer]      Policy#:[id number]     Phone#:    Development worker, community:       Phone#:    The Actuary for patients in Inpatient Rehabilitation Facilities with attached Privacy Act [place] Records was provided and verbally reviewed with: Patient   Emergency Contact Information Contact Information        Name Relation Home Work Mobile    [place] Spouse [phone]   [phone]           Current Medical History  Patient Admitting Diagnosis: CVA History of Present Illness: Justin Mann is a 76 y.o. male with medical history significant of Parkinson's, Hx of colon cancer s/p colostomy, HTN, CKD3b who presented  to the [facility] ED on 08/16/2021 with acute onset right-sided arm and leg weakness. In the ED, initial vitals: afebrile, pulse 67, BP 199/95, sating 95% on room air.  Labs notable for Cr 1.95 (baseline).  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  Pt had neuro tele consult in the ED.  Pt was out of window for thrombolytics.  Pt received ASA 324 mg in the ED and was admitted for full stroke workup. Pt. Was seen by PT and OT who recommended CIR to assist return to PLOF.   Patient's medical record from [facility] has been reviewed by the rehabilitation admission coordinator and physician.   Past Medical History      Past Medical History:  Diagnosis Date   Diabetes mellitus without complication ([place])     Foley catheter in place     Hypertension     Rectal cancer ([place])        Has the patient had major surgery during 100 days prior to admission? No   Family History   family history includes Heart attack in his father; Hypertension in his father and sister; Stroke in his sister.   Current Medications   Current Facility-Administered Medications:    acetaminophen (TYLENOL) tablet 1,000 mg, 1,000 mg, Oral, TID PRN, Enzo Bi, MD, 1,000 mg at 08/18/21 2007   amLODipine (NORVASC) tablet 5 mg, 5 mg, Oral, Daily, Wyvonnia Dusky, MD, 5 mg at 08/19/21 2353   aspirin EC tablet 81 mg, 81 mg, Oral, Daily, Greta Doom, MD, 81 mg at 08/19/21 0950   atorvastatin (LIPITOR) tablet 80 mg, 80 mg, Oral, Daily, Enzo Bi, MD, 80 mg at 08/19/21 0950   carbidopa-levodopa (SINEMET IR)  25-100 MG per tablet immediate release 2 tablet, 2 tablet, Oral, QID, Greta Doom, MD, 2 tablet at 08/19/21 0950   clopidogrel (PLAVIX) tablet 75 mg, 75 mg, Oral, Daily, Greta Doom, MD, 75 mg at 08/19/21 0950   docusate sodium (COLACE) capsule 100 mg, 100 mg, Oral, BID PRN, Enzo Bi, MD   enoxaparin (LOVENOX) injection 40 mg, 40 mg, Subcutaneous, Q24H, Enzo Bi, MD, 40 mg at 08/18/21 2006   finasteride (PROSCAR) tablet 5 mg, 5 mg, Oral, Daily, Enzo Bi, MD, 5 mg at 08/19/21 0950   loratadine (CLARITIN)  tablet 10 mg, 10 mg, Oral, Daily, Wyvonnia Dusky, MD, 10 mg at 08/19/21 0612   ondansetron [facility]) injection 4 mg, 4 mg, Intravenous, Q6H PRN, Enzo Bi, MD   ondansetron (ZOFRAN-ODT) disintegrating tablet 4 mg, 4 mg, Oral, Q8H PRN, Enzo Bi, MD   polyethylene glycol (MIRALAX / GLYCOLAX) packet 17 g, 17 g, Oral, BID PRN, Enzo Bi, MD   tamsulosin [facility]) capsule 0.8 mg, 0.8 mg, Oral, QHS, Enzo Bi, MD, 0.8 mg at 08/18/21 2007   Patients Current Diet:  Diet Order                  Diet - low sodium heart healthy             Diet regular Room service appropriate? Yes; Fluid consistency: Thin  Diet effective now                         Precautions / Restrictions Precautions Precautions: Fall Restrictions Weight Bearing Restrictions: No    Has the patient had 2 or more falls or a fall with injury in the past year? No   Prior Activity Level Community (5-7x/wk): Pt. active in the community PTA   Prior Functional Level Self Care: Did the patient need help bathing, dressing, using the toilet or eating? Independent   Indoor Mobility: Did the patient need assistance with walking from room to room (with or without device)? Independent   Stairs: Did the patient need assistance with internal or external stairs (with or without device)? Independent   Functional Cognition: Did the patient need help planning regular tasks such as shopping or remembering to  take medications? Independent   Patient Information Are you of Hispanic, Latino/a,or Spanish origin?: A. No, not of Hispanic, Latino/a, or Spanish origin What is your race?: A. White Do you need or want an interpreter to communicate with a doctor or health care staff?: 0. No   Patient's Response To:  Health Literacy and Transportation Is the patient able to respond to health literacy and transportation needs?: Yes Health Literacy - How often do you need to have someone help you when you read instructions, pamphlets, or other written material from your doctor or pharmacy?: Never In the past 12 months, has lack of transportation kept you from medical appointments or from getting medications?: Yes In the past 12 months, has lack of transportation kept you from meetings, work, or from getting things needed for daily living?: No   Development worker, international aid / [place] Devices/Equipment: Other (Comment) Home Equipment: Shower seat, Grab bars - tub/shower, Rollator (4 wheels)   Prior Device Use: Indicate devices/aids used by the patient prior to current illness, exacerbation or injury? Walker   Current Functional Level Cognition   Overall Cognitive Status: Within Functional Limits for tasks assessed Orientation Level: Oriented X4 General Comments: states he does not recall HEP from prior day    Extremity Assessment (includes Sensation/Coordination)   Upper Extremity Assessment: RUE deficits/detail RUE Deficits / Details: 4-/5 grossly RUE Sensation: WNL RUE Coordination: decreased gross motor  Lower Extremity Assessment: Defer to PT evaluation RLE Deficits / Details: RLE 3/5 for hip flexion, RLE 4-/5 for knee flexion/extension. RLE Sensation: history of peripheral neuropathy RLE Coordination: decreased gross motor LLE Deficits / Details: LLE 4/5 grossly LLE Sensation: history of peripheral neuropathy     ADLs   Overall ADL's : Needs assistance/impaired General ADL  Comments: MIN A + HHA for ADL t/f, reaches out for  support on furniture     Mobility   Overal bed mobility: Needs Assistance Bed Mobility: Supine to Sit Supine to sit: Min assist Sit to supine: Min guard     Transfers   Overall transfer level: Needs assistance Equipment used: 1 person hand held assist Transfers: Sit to/from Stand Sit to Stand: Min assist, From elevated surface General transfer comment: reaches for BUE support on furniture, cues for RLE clearance ~10 ft mobility     Ambulation / Gait / Stairs / Wheelchair Mobility   Ambulation/Gait Ambulation/Gait assistance: Min guard, Min assist Gait Distance (Feet): 60 Feet (3 bouts of 55f; PT led rest breaks due to fatigue) Assistive device: Rolling walker (2 wheels) Gait Pattern/deviations: Step-to pattern, Decreased step length - right, Decreased step length - left, Decreased weight shift to right, Decreased stride length, Trunk flexed General Gait Details: R LE step length deficit; with increased fatigue R LE lags behind and decreases BOS; constant verbal cueing about fully clearing R foot w/ each step. Gait velocity: decreased     Posture / Balance Dynamic Sitting Balance Sitting balance - Comments: heavy flexed trunk in sitting Balance Overall balance assessment: Needs assistance Sitting-balance support: No upper extremity supported, Feet supported Sitting balance-Leahy Scale: Fair Sitting balance - Comments: heavy flexed trunk in sitting Postural control: Right lateral lean Standing balance support: During functional activity, Single extremity supported Standing balance-Leahy Scale: Fair Standing balance comment: heavy trunk flexion     Special needs/care consideration Bladder incontinence, External Urinary Catheter    Previous Home Environment (from acute therapy documentation) Living Arrangements: Spouse/significant other  Lives With: Spouse Available Help at Discharge: Family, Available 24 hours/day [place] (down 2 small steps into den then up to kitchen) Home Access: Stairs to enter Entrance Stairs-Rails: Can reach both Entrance Stairs-Number of Steps: 4 Bathroom Shower/Tub: TChiropodist Standard Bathroom Accessibility: Yes How Accessible: Accessible via walker [place]wn No   Discharge Living Setting Plans for Discharge Living Setting: Patient's home Type of Home at Discharge: House Discharge Home Layout: One level Discharge Home Access: Stairs to enter Entrance Stairs-Rails: Can reach both Entrance Stairs-Number of Steps: 4 Discharge Bathroom Shower/Tub: Tub/shower unit Discharge Bathroom Toilet: Standard Discharge Bathroom Accessibility: Yes How Accessible: Accessible via walker Does the patient have any problems obtaining your medications?: No   Social/Family/Support Systems Patient Roles: Spouse Contact Information: [phone] Caregiver: MJanet BerlinAbility/Limitations of Caregiver: Can provide min A Caregiver Availability: 24/7 Discharge Plan Discussed with Primary Caregiver: Yes Is Caregiver In Agreement with Plan?: Yes Does Caregiver/Family have Issues with Lodging/Transportation while Pt is in Rehab?: No   Goals Patient/Family Goal for Rehab: PT/OT Supervision Expected length of stay: 5-7 days Pt/Family Agrees to Admission and willing to participate: Yes Program Orientation Provided & Reviewed with Pt/Caregiver Including Roles  & Responsibilities: Yes   Decrease burden of Care through IP rehab admission: Specialzed equipment needs, Decrease number of caregivers, Bowel and bladder program, and Patient/family education   Possible need for SNF placement upon discharge: not anticipated    Patient Condition: [facility] spoken with CM, and patient. I met with patient at the bedside for inpatient rehabilitation assessment.  Patient will  benefit from ongoing PT and OT, can actively participate in 3 hours of therapy a day 5 days of the week, and can make measurable gains during the admission.  Patient will also benefit from the coordinated team approach  during an Inpatient Acute Rehabilitation admission.  The patient will receive intensive therapy as well as Rehabilitation physician, nursing, social worker, and care management interventions.  Due to safety, skin/wound care, disease management, medication administration, pain management, and patient education the patient requires 24 hour a day rehabilitation nursing.  The patient is currently Min G-Min A with mobility and Min A with basic ADLs.  Discharge setting and therapy post discharge at home with home health is anticipated.  Patient has agreed to participate in the Acute Inpatient Rehabilitation Program and will admit today.   Preadmission Screen Completed By:  Retta Diones, with updates by Gayland Curry 08/19/2021 10:36 AM ______________________________________________________________________   Discussed status with Dr. Ranell Patrick on 08/19/21  at 10:36 AM and received approval for admission today.   Admission Coordinator:  Retta Diones, RN, with updates by Gayland Curry, MS, CCC-SLP time 10:36 AM/Date 08/19/21     Assessment/Plan: Diagnosis: CVA Does the need for close, 24 hr/day Medical supervision in concert with the patient's rehab needs make it unreasonable for this patient to be served in a less intensive setting? Yes Co-Morbidities requiring supervision/potential complications: overweight, hyperglycemia, suboptimal potassium, AKI, hyperlipidemia Due to bladder management, bowel management, safety, skin/wound care, disease management, medication administration, pain management, and patient education, does the patient require 24 hr/day rehab nursing? Yes Does the patient require coordinated care of a physician, rehab nurse, PT, OT, and SLP to address physical  and functional deficits in the context of the above medical diagnosis(es)? Yes Addressing deficits in the following areas: balance, endurance, locomotion, strength, transferring, bowel/bladder control, bathing, dressing, feeding, grooming, toileting, cognition, and psychosocial support Can the patient actively participate in an intensive therapy program of at least 3 hrs of therapy 5 days a week? Yes The potential for patient to make measurable gains while on inpatient rehab is excellent Anticipated functional outcomes upon discharge from inpatient rehab: supervision PT, supervision OT, supervision SLP Estimated rehab length of stay to reach the above functional goals is: 5-7 days Anticipated discharge destination: Home 10. Overall Rehab/Functional Prognosis: excellent     MD Signature: Leeroy Cha, MD

## 2021-08-20 DIAGNOSIS — N183 Chronic kidney disease, stage 3 unspecified: Secondary | ICD-10-CM

## 2021-08-20 DIAGNOSIS — I10 Essential (primary) hypertension: Secondary | ICD-10-CM

## 2021-08-20 LAB — COMPREHENSIVE METABOLIC PANEL
ALT: <5 U/L (ref 0–44)
AST: 14 U/L — ABNORMAL LOW (ref 15–41)
Albumin: 2.9 g/dL — ABNORMAL LOW (ref 3.5–5.0)
Alkaline Phosphatase: 55 U/L (ref 38–126)
Anion gap: 7 (ref 5–15)
BUN: 38 mg/dL — ABNORMAL HIGH (ref 8–23)
CO2: 25 mmol/L (ref 22–32)
Calcium: 9 mg/dL (ref 8.9–10.3)
Chloride: 107 mmol/L (ref 98–111)
Creatinine, Ser: 2.12 mg/dL — ABNORMAL HIGH (ref 0.61–1.24)
GFR, Estimated: 32 mL/min — ABNORMAL LOW (ref >60)
Glucose, Bld: 126 mg/dL — ABNORMAL HIGH (ref 70–99)
Potassium: 3.8 mmol/L (ref 3.5–5.1)
Sodium: 139 mmol/L (ref 135–145)
Total Bilirubin: 0.9 mg/dL (ref 0.3–1.2)
Total Protein: 5.9 g/dL — ABNORMAL LOW (ref 6.5–8.1)

## 2021-08-20 LAB — CBC WITH DIFFERENTIAL/PLATELET
Abs Immature Granulocytes: 0.03 10*3/uL (ref 0.00–0.07)
Basophils Absolute: 0 10*3/uL (ref 0.0–0.1)
Basophils Relative: 0 %
Eosinophils Absolute: 0.1 10*3/uL (ref 0.0–0.5)
Eosinophils Relative: 2 %
HCT: 31.5 % — ABNORMAL LOW (ref 39.0–52.0)
Hemoglobin: 10.3 g/dL — ABNORMAL LOW (ref 13.0–17.0)
Immature Granulocytes: 0 %
Lymphocytes Relative: 18 %
Lymphs Abs: 1.2 10*3/uL (ref 0.7–4.0)
MCH: 28.5 pg (ref 26.0–34.0)
MCHC: 32.7 g/dL (ref 30.0–36.0)
MCV: 87 fL (ref 80.0–100.0)
Monocytes Absolute: 0.7 10*3/uL (ref 0.1–1.0)
Monocytes Relative: 9 %
Neutro Abs: 5 10*3/uL (ref 1.7–7.7)
Neutrophils Relative %: 71 %
Platelets: 206 10*3/uL (ref 150–400)
RBC: 3.62 MIL/uL — ABNORMAL LOW (ref 4.22–5.81)
RDW: 13.2 % (ref 11.5–15.5)
WBC: 7 10*3/uL (ref 4.0–10.5)
nRBC: 0 % (ref 0.0–0.2)

## 2021-08-20 LAB — MAGNESIUM: Magnesium: 2 mg/dL (ref 1.7–2.4)

## 2021-08-20 NOTE — Progress Notes (Signed)
[place] PHYSICAL MEDICINE & REHABILITATION PROGRESS NOTE  Subjective/Complaints: Patient seen sitting up in bed working with therapy this morning.  He states he slept well overnight.  He wants to get better quickly.  ROS: Denies CP, SOB, N/V/D  Objective: Vital Signs: Blood pressure (!) 153/76, pulse (!) 59, temperature 98.4 F (36.9 C), temperature source Oral, resp. rate 12, height 5\' 10"  (1.778 m), weight 88.3 kg, SpO2 98 %. No results found. Recent Labs    08/19/21 0532 08/20/21 0639  WBC 6.6 7.0  HGB 10.3* 10.3*  HCT 31.1* 31.5*  PLT 189 206   Recent Labs    08/19/21 0532 08/20/21 0639  NA 140 139  K 3.8 3.8  CL 107 107  CO2 25 25  GLUCOSE 125* 126*  BUN 42* 38*  CREATININE 1.97* 2.12*  CALCIUM 8.7* 9.0    Intake/Output Summary (Last 24 hours) at 08/20/2021 1451 Last data filed at 08/20/2021 1351 Gross per 24 hour  Intake 836 ml  Output 1350 ml  Net -514 ml        Physical Exam: BP (!) 153/76 (BP Location: Right Arm)    Pulse (!) 59    Temp 98.4 F (36.9 C) (Oral)    Resp 12    Ht 5\' 10"  (1.778 m)    Wt 88.3 kg    SpO2 98%    BMI 27.93 kg/m  Constitutional: No distress . Vital signs reviewed. HENT: Normocephalic.  Atraumatic. Eyes: EOMI. No discharge. Cardiovascular: No JVD.  RRR. Respiratory: Normal effort.  No stridor.  Bilateral clear to auscultation. GI: Non-distended.  BS +. Skin: Warm and dry.  Intact. Psych: Flat. Musc: No edema in extremities.  No tenderness in extremities. Neuro: Alert Motor: LUE/LLE: 5/5 proximal distally on RUE: 5/5 proximal distal RLE: 4/5 proximal distal with apraxia  Assessment/Plan: 1. Functional deficits which require 3+ hours per day of interdisciplinary therapy in a comprehensive inpatient rehab setting. Physiatrist is providing close team supervision and 24 hour management of active medical problems listed below. Physiatrist and rehab team continue to assess barriers to discharge/monitor patient progress  toward functional and medical goals   Care Tool:  Bathing    Body parts bathed by patient: Right arm, Left arm, Chest, Abdomen, Front perineal area, Buttocks, Right upper leg, Left upper leg, Right lower leg, Left lower leg, Face         Bathing assist Assist Level: Minimal Assistance - Patient > 75%     Upper Body Dressing/Undressing Upper body dressing   What is the patient wearing?: Pull over shirt    Upper body assist Assist Level: Set up assist    Lower Body Dressing/Undressing Lower body dressing      What is the patient wearing?: Pants, Incontinence brief     Lower body assist Assist for lower body dressing: Minimal Assistance - Patient > 75%     Toileting Toileting    Toileting assist Assist for toileting: Independent with assistive device Assistive Device Comment: urinal   Transfers Chair/bed transfer  Transfers assist     Chair/bed transfer assist level: (P) Maximal Assistance - Patient 25 - 49% (without assistive device)     Locomotion Ambulation   Ambulation assist              Walk 10 feet activity   Assist           Walk 50 feet activity   Assist           Walk 150  feet activity   Assist           Walk 10 feet on uneven surface  activity   Assist           Wheelchair     Assist               Wheelchair 50 feet with 2 turns activity    Assist            Wheelchair 150 feet activity     Assist           Medical Problem List and Plan: 1. 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  Antithrombotics: -DVT/anticoagulation:  Pharmaceutical: Lovenox             -antiplatelet therapy: DAPT X 3 weeks followed by Plavix 3. Pain Management: tylenol prn 4. Mood: Lcsw to follow for evaluation and support             -antipsychotic agents: N/A 5. Neuropsych: This patient is  capable of making decisions on his own behalf. 6. Skin/Wound Care:  Routine pressure relief measures. 7. Fluids/Electrolytes/Nutrition: Monitor I/Os.  8. HTN: Permissive HTN with slow titration of BP meds, continue amlodipine  Monitor with increased exertion 9. CKD: Avoid nephrotoxic meds and hypotensive episodes             --baseline SCr-1.95 to 2.08.   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 Nocturia: Gets up every 2 hours at nights. Has seen urology in the past. Continue flomax 0.8mg  HS.              --will use condom cath at nights to help with sleep.  12. Parkinson's Disease: continue Sinemet 25-100mg  2 tablets 4 times per day.   13. Insomnia: continue trazodone prn HS   LOS: 1 days A FACE TO FACE EVALUATION WAS PERFORMED  Jermar Colter Lorie Phenix 08/20/2021, 2:51 PM

## 2021-08-20 NOTE — Plan of Care (Signed)
°  Problem: RH Balance Goal: LTG Patient will maintain dynamic sitting balance (PT) Description: LTG:  Patient will maintain dynamic sitting balance with assistance during mobility activities (PT) Flowsheets (Taken 08/20/2021 1911) LTG: Pt will maintain dynamic sitting balance during mobility activities with:: Supervision/Verbal cueing Goal: LTG Patient will maintain dynamic standing balance (PT) Description: LTG:  Patient will maintain dynamic standing balance with assistance during mobility activities (PT) Flowsheets (Taken 08/20/2021 1911) LTG: Pt will maintain dynamic standing balance during mobility activities with:: Contact Guard/Touching assist   Problem: Sit to Stand Goal: LTG:  Patient will perform sit to stand with assistance level (PT) Description: LTG:  Patient will perform sit to stand with assistance level (PT) Flowsheets (Taken 08/20/2021 1911) LTG: PT will perform sit to stand in preparation for functional mobility with assistance level: Supervision/Verbal cueing   Problem: RH Bed Mobility Goal: LTG Patient will perform bed mobility with assist (PT) Description: LTG: Patient will perform bed mobility with assistance, with/without cues (PT). Flowsheets (Taken 08/20/2021 1911) LTG: Pt will perform bed mobility with assistance level of: Supervision/Verbal cueing   Problem: RH Bed to Chair Transfers Goal: LTG Patient will perform bed/chair transfers w/assist (PT) Description: LTG: Patient will perform bed to chair transfers with assistance (PT). Flowsheets (Taken 08/20/2021 1911) LTG: Pt will perform Bed to Chair Transfers with assistance level: Supervision/Verbal cueing   Problem: RH Car Transfers Goal: LTG Patient will perform car transfers with assist (PT) Description: LTG: Patient will perform car transfers with assistance (PT). Flowsheets (Taken 08/20/2021 1911) LTG: Pt will perform car transfers with assist:: Supervision/Verbal cueing   Problem: RH Ambulation Goal: LTG  Patient will ambulate in controlled environment (PT) Description: LTG: Patient will ambulate in a controlled environment, # of feet with assistance (PT). Flowsheets (Taken 08/20/2021 1911) LTG: Pt will ambulate in controlled environ  assist needed:: Supervision/Verbal cueing LTG: Ambulation distance in controlled environment: 140ft using LRAD Goal: LTG Patient will ambulate in home environment (PT) Description: LTG: Patient will ambulate in home environment, # of feet with assistance (PT). Flowsheets (Taken 08/20/2021 1911) LTG: Pt will ambulate in home environ  assist needed:: Supervision/Verbal cueing LTG: Ambulation distance in home environment: 32ft using LRAD   Problem: RH Stairs Goal: LTG Patient will ambulate up and down stairs w/assist (PT) Description: LTG: Patient will ambulate up and down # of stairs with assistance (PT) Flowsheets (Taken 08/20/2021 1911) LTG: Pt will ambulate up/down stairs assist needed:: Supervision/Verbal cueing LTG: Pt will  ambulate up and down number of stairs: 4 stairs using HRs per home set-up

## 2021-08-20 NOTE — Plan of Care (Signed)
°  Problem: Consults Goal: RH STROKE PATIENT EDUCATION Description: See Patient Education module for education specifics  Outcome: Progressing   Problem: RH BOWEL ELIMINATION Goal: RH STG MANAGE BOWEL WITH ASSISTANCE Description: STG Manage Bowel with mod I  Assistance. Outcome: Progressing Goal: RH STG MANAGE BOWEL W/MEDICATION W/ASSISTANCE Description: STG Manage Bowel with Medication with  mod I Assistance. Outcome: Progressing   Problem: RH BLADDER ELIMINATION Goal: RH STG MANAGE BLADDER WITH ASSISTANCE Description: STG Manage Bladder With mod I Assistance Outcome: Progressing   Problem: RH SAFETY Goal: RH STG ADHERE TO SAFETY PRECAUTIONS W/ASSISTANCE/DEVICE Description: STG Adhere to Safety Precautions With cues Assistance/Device. Outcome: Progressing   Problem: RH PAIN MANAGEMENT Goal: RH STG PAIN MANAGED AT OR BELOW PT'S PAIN GOAL Description: At or below level 4 with prns Outcome: Progressing   Problem: RH KNOWLEDGE DEFICIT Goal: RH STG INCREASE KNOWLEDGE OF DIABETES Description: Patient and spouse will be able to manage DM with medications and dietary modifications using handouts and educational resources independently Outcome: Progressing Goal: RH STG INCREASE KNOWLEDGE OF HYPERTENSION Description: Patient and spouse will be able to manage HTN with medications and dietary modifications using handouts and educational resources independently Outcome: Progressing Goal: RH STG INCREASE KNOWLEGDE OF HYPERLIPIDEMIA Description: Patient and spouse will be able to manage HLD with medications and dietary modifications using handouts and educational resources independently Outcome: Progressing Goal: RH STG INCREASE KNOWLEDGE OF STROKE PROPHYLAXIS Description: Patient and spouse will be able to manage secondary stroke risks with medications and dietary modifications using handouts and educational resources independently Outcome: Progressing

## 2021-08-20 NOTE — Evaluation (Signed)
Physical Therapy Assessment and Plan  Patient Details  Name: Justin Mann MRN: [id number] Date of Birth: 1946/03/02  PT Diagnosis: Abnormal posture, Abnormality of gait, Difficulty walking, Hemiparesis dominant, Impaired sensation, Muscle weakness, and Pain in R shoulder Rehab Potential: Good ELOS: ~1.5 - 2 weeks   Today's Date: 08/20/2021 PT Individual Time: 5456-2563 PT Individual Time Calculation (min): 59 min    Hospital Problem: Principal Problem:   Stroke (cerebrum) [facility])   Past Medical History:  Past Medical History:  Diagnosis Date   Agent orange exposure    Diabetes mellitus without complication ([place])    Hypertension    Neuropathy    Rectal cancer ([place])    Past Surgical History:  Past Surgical History:  Procedure Laterality Date   ILEOSTOMY     TONSILLECTOMY      Assessment & Plan Clinical Impression: Patient is a 76 y.o. year old male with  history of HTN, PD ([facility]), CKD, rectal cancer s/p APR who was admitted on 08/16/20 with right sided weakness and difficulty walking. UDS  negative. 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  Right shoulder X rays were negative for fracture. 2D echo showed EF 65-70% with no wall abnormality and mild MVR. He has a history of falling. Patient transferred to CIR on 08/19/2021 .   Patient currently requires mod assist with mobility secondary to muscle weakness, decreased cardiorespiratoy endurance, impaired timing and sequencing and unbalanced muscle activation, and decreased standing balance, decreased postural control, and decreased balance strategies.  Prior to hospitalization, patient was independent  with mobility and lived with Spouse in a House home.  Home access is 4Stairs to enter.  Patient will benefit from skilled PT intervention to maximize safe functional mobility, minimize fall risk, and decrease  caregiver burden for planned discharge home with 24 hour supervision.  Anticipate patient will benefit from follow up [facility] at discharge.  PT - End of Session Activity Tolerance: Tolerates 30+ min activity with multiple rests Endurance Deficit: Yes Endurance Deficit Description: pt fatigues quickly requiring frequent seated rest breaks PT Assessment Rehab Potential (ACUTE/IP ONLY): Good PT Barriers to Discharge: Home environment access/layout PT Patient demonstrates impairments in the following area(s): Balance;Perception;Behavior;Safety;Edema;Sensory;Endurance;Skin Integrity;Motor;Nutrition;Pain PT Transfers Functional Problem(s): Bed Mobility;Bed to Chair;Car;Furniture PT Locomotion Functional Problem(s): Ambulation;Wheelchair Mobility;Stairs PT Plan PT Intensity: Minimum of 1-2 x/day ,45 to 90 minutes PT Frequency: 5 out of 7 days PT Duration Estimated Length of Stay: ~1.5 - 2 weeks PT Treatment/Interventions: Ambulation/gait training;Community reintegration;DME/adaptive equipment instruction;Neuromuscular re-education;Psychosocial support;Stair training;UE/LE Strength taining/ROM;Wheelchair propulsion/positioning;Balance/vestibular training;Discharge planning;Functional electrical stimulation;Pain management;Skin care/wound management;Therapeutic Activities;UE/LE Coordination activities;Cognitive remediation/compensation;Disease management/prevention;Functional mobility training;Patient/family education;Splinting/orthotics;Therapeutic Exercise;Visual/perceptual remediation/compensation PT Transfers Anticipated Outcome(s): supervision using LRAD PT Locomotion Anticipated Outcome(s): supervision using LRAD PT Recommendation Recommendations for Other Services: Therapeutic Recreation consult Therapeutic Recreation Interventions: Outing/community reintergration Follow Up Recommendations: Home health PT;24 hour supervision/assistance Patient destination: Home Equipment Recommended: To be  determined;Other (comment) Equipment Details: has rollator   PT Evaluation Precautions/Restrictions Precautions Precautions: Fall;Other (comment) Precaution Comments: increased falls in recent hx; colostomy bag, R hemi (LE>UE) Restrictions Weight Bearing Restrictions: No Pain Pain Assessment Pain Scale: 0-10 Pain Score: 6  Pain Type: Acute pain Pain Location: Shoulder Pain Orientation: Right Pain Descriptors / Indicators: Other (Comment) ("hurts") Pain Onset: On-going Pain Intervention(s): Relaxation;Emotional support;Distraction;Rest;Repositioned Pain Interference Pain Interference Pain Effect on Sleep: 1. Rarely or not at all Pain Interference with Therapy Activities: 1. Rarely or not at  all Pain Interference with Day-to-Day Activities: 1. Rarely or not at all Home Living/Prior [place] Available Help at Discharge: Family;Available 24 hours/day Type of Home: House Home Access: Stairs to enter [employer] of Steps: 4 Entrance Stairs-Rails: Can reach both;Right;Left Home Layout: One level Additional Comments: 1 (3") to get down to sunroom/den and then have to step back up that step to get to kitchen  Lives With: Spouse [name]) Prior Function Level of Independence: Other (comment);Independent with gait;Requires assistive device for independence;Independent with transfers (used rollator at night for toileting or for longer distance community mobility - rides electric cart at grocery store)  Able to Take Stairs?: Yes Driving: No Vision/Perception  Vision - History Ability to See in Adequate Light: 0 Adequate Perception Perception: Within Functional Limits Praxis Praxis: Intact  Cognition Overall Cognitive Status: Within Functional Limits for tasks assessed Arousal/Alertness: Awake/alert Orientation Level: Oriented X4 Year: 2023 Month: February Day of Week: Correct Attention: Focused;Sustained;Selective Focused Attention: Appears  intact Sustained Attention: Appears intact Memory: Appears intact Safety/Judgment: Appears intact Sensation  Sensation Light Touch: Impaired Detail Peripheral sensation comments: hx of peripheral neuropathy with decreased sensation below the knees bilaterally Light Touch Impaired Details: Impaired LLE;Impaired RLE Hot/Cold: Not tested Proprioception: Impaired by gross assessment (associated with hx of peripheral neuropathy) Stereognosis: Not tested Coordination Gross Motor Movements are Fluid and Coordinated: No Fine Motor Movements are Fluid and Coordinated: No Coordination and Movement Description: GM movements impaired due to 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) and hx of Parkinsonian Disorder resulting in intermittent shuffled/festinating gait Motor  Motor Motor: Hemiplegia;Abnormal postural alignment and control Motor - Skilled Clinical Observations: Slight right hemiparesis with very flexed posture in standing.   Trunk/Postural Assessment  Cervical Assessment Cervical Assessment: Exceptions to [facility] (significant cervical protraction and flexion with decreased flexibility towards neutral) Thoracic Assessment Thoracic Assessment: Exceptions to [facility] (thoracic rounding with bilateral shouler protraction) Lumbar Assessment Lumbar Assessment: Exceptions to [facility] (significant posterior pelvic tilt in sitting) Postural Control Postural Control: Deficits on evaluation Righting Reactions: delayed and insufficient with limited ability to utilize ankle strategy due to weak ankle PF/DF and impaired sensation Postural Limitations: postural limitations in trunk, hips, knees noted in standing with hx of Parkinson's premorbidly  Balance Balance Balance Assessed: Yes Total Score: 5 Static Sitting Balance Static Sitting - Balance Support: Feet unsupported Static Sitting - Level of Assistance: 5: Stand by assistance Dynamic Sitting Balance Dynamic Sitting - Balance Support: Feet supported Dynamic Sitting  - Level of Assistance: 4: Min assist Static Standing Balance Static Standing - Balance Support: During functional activity;Bilateral upper extremity supported Static Standing - Level of Assistance: 4: Min assist Dynamic Standing Balance Dynamic Standing - Balance Support: During functional activity;Bilateral upper extremity supported Dynamic Standing - Level of Assistance: 3: Mod assist Extremity Assessment      RLE Assessment RLE Assessment: Exceptions to [facility] Active Range of Motion (AROM) Comments: WFL General Strength Comments: assessed in sitting RLE Strength Right Hip Flexion: 2+/5 Right Knee Flexion: 3-/5 Right Knee Extension: 3-/5 Right Ankle Dorsiflexion: 3/5 Right Ankle Plantar Flexion: 2+/5 LLE Assessment LLE Assessment: Exceptions to [facility] Active Range of Motion (AROM) Comments: WFL General Strength Comments: assessed in sitting LLE Strength Left Hip Flexion: 4+/5 Left Knee Flexion: 4+/5 Left Knee Extension: 4+/5 Left Ankle Dorsiflexion: 3+/5 Left Ankle Plantar Flexion: 3+/5  Care Tool Care Tool Bed Mobility Roll left and right activity   Roll left and right assist level: Minimal Assistance - Patient > 75%    Sit to lying activity  Sit to lying assist level: Minimal Assistance - Patient > 75%    Lying to sitting on side of bed activity   Lying to sitting on side of bed assist level: the ability to move from lying on the back to sitting on the side of the bed with no back support.: Minimal Assistance - Patient > 75%     Care Tool Transfers Sit to stand transfer   Sit to stand assist level: Moderate Assistance - Patient 50 - 74%    Chair/bed transfer   Chair/bed transfer assist level: Moderate Assistance - Patient 50 - 74%     Physiological scientist transfer assist level: Moderate Assistance - Patient 50 - 74%      Care Tool Locomotion Ambulation Ambulation activity did not occur:  (requires use of RW for safety when pt only  intermittently used rollator at home) Assist level: Moderate Assistance - Patient 50 - 74% Assistive device: Walker-rolling Max distance: 60f  Walk 10 feet activity   Assist level: Moderate Assistance - Patient - 50 - 74% Assistive device: Walker-rolling   Walk 50 feet with 2 turns activity   Assist level: Moderate Assistance - Patient - 50 - 74% Assistive device: Walker-rolling  Walk 150 feet activity Walk 150 feet activity did not occur: Safety/medical concerns      Walk 10 feet on uneven surfaces activity Walk 10 feet on uneven surfaces activity did not occur: Safety/medical concerns      Stairs   Assist level: Moderate Assistance - Patient - 50 - 74% Stairs assistive device: 2 hand rails Max number of stairs: 4  Walk up/down 1 step activity   Walk up/down 1 step (curb) assist level: Moderate Assistance - Patient - 50 - 74% Walk up/down 1 step or curb assistive device: 2 hand rails  Walk up/down 4 steps activity   Walk up/down 4 steps assist level: Moderate Assistance - Patient - 50 - 74% Walk up/down 4 steps assistive device: 2 hand rails  Walk up/down 12 steps activity Walk up/down 12 steps activity did not occur: Safety/medical concerns      Pick up small objects from floor   Pick up small object from the floor assist level: Total Assistance - Patient < 25%    Wheelchair Is the patient using a wheelchair?: Yes (used for transportation to/from therapy gym) Type of Wheelchair: Manual   Wheelchair assist level: Dependent - Patient 0%    Wheel 50 feet with 2 turns activity   Assist Level: Dependent - Patient 0%  Wheel 150 feet activity   Assist Level: Dependent - Patient 0%    Refer to Care Plan for Long Term Goals  SHORT TERM GOAL WEEK 1 PT Short Term Goal 1 (Week 1): Pt will perform supine<>sit with CGA PT Short Term Goal 2 (Week 1): Pt will perform sit<>stands using LRAD with min assist consistently PT Short Term Goal 3 (Week 1): Pt will perform bed<>chair  transfers using LRAD with min assist consistently PT Short Term Goal 4 (Week 1): Pt will ambulate at least 775fusing LRAD with min assist PT Short Term Goal 5 (Week 1): Pt will navigate up/down a step using LRAD with min assist to simulate step inside home  Recommendations for other services: Therapeutic Recreation  Outing/community reintegration  Skilled Therapeutic Intervention Pt received sitting in recliner with his wife, MaStanton Kidneypresent and pt agreeable to therapy session. Evaluation completed (see details above) with  patient education regarding purpose of PT evaluation, PT POC and goals, therapy schedule, weekly team meetings, and other CIR information including safety plan and fall risk safety. Pt completed the below functional mobility tasks with the specified levels of assistance and skilled cuing. Therapist initiated education to pt/wife on importance of daily foot skin assessments as pt has hx of peripheral neuropathy and impaired sensation bilaterally. Also, educated on recommendation to wear tennis shoes in the home to protect feet and provide balance support as previously pt was wearing standard socks to walk in the home increasing his risk for falls. Thearpist educate pt on importance of exercise and recommendation to search for a Parkinson's based exercise program in the future once he is cleared by MD. During gait training pt demos decreased R LE foot clearance and step length as well as slight shuffle/festinating and freezing when getting close to an object. Pt requires cuing to navigate stairs with step-to technique leading with L LE on ascent and R LE on descent to maintain balance and increase safety. At end of session, pt left supine in bed with needs in reach, bed alarm on, and pt's wife present.  Mobility Bed Mobility Bed Mobility: Supine to Sit;Sit to Supine Supine to Sit: Minimal Assistance - Patient > 75% Sit to Supine: Contact Guard/Touching assist Transfers Transfers: Sit  to Stand;Stand to Sit;Stand Pivot Transfers Sit to Stand: Moderate Assistance - Patient 50-74% Stand to Sit: Moderate Assistance - Patient 50-74% Stand Pivot Transfers: Moderate Assistance - Patient 50 - 74% Stand Pivot Transfer Details: Verbal cues for safe use of DME/AE;Verbal cues for precautions/safety;Verbal cues for sequencing;Verbal cues for technique;Verbal cues for gait pattern;Tactile cues for initiation;Tactile cues for sequencing;Tactile cues for weight shifting;Tactile cues for posture;Visual cues for safe use of DME/AE;Visual cues/gestures for precautions/safety;Visual cues/gestures for sequencing;Manual facilitation for weight shifting Transfer (Assistive device): Rolling walker Locomotion  Gait Ambulation: Yes Gait Assistance: Minimal Assistance - Patient > 75% Gait Distance (Feet): 50 Feet Assistive device: Rolling walker Gait Assistance Details: Tactile cues for sequencing;Tactile cues for weight shifting;Visual cues for safe use of DME/AE;Visual cues/gestures for sequencing;Verbal cues for sequencing;Verbal cues for technique;Verbal cues for safe use of DME/AE;Verbal cues for gait pattern;Manual facilitation for weight shifting Gait Gait: Yes Gait Pattern: Impaired Gait Pattern: Poor foot clearance - right;Step-through pattern;Wide base of support;Decreased step length - right;Decreased step length - left;Decreased stride length;Decreased hip/knee flexion - right;Lateral trunk lean to right Gait velocity: decreased Stairs / Additional Locomotion Stairs: Yes Stairs Assistance: Minimal Assistance - Patient > 75%;Moderate Assistance - Patient 50 - 74% Stair Management Technique: Two rails;Step to pattern;Forwards Number of Stairs: 4 Height of Stairs: 6 Wheelchair Mobility Wheelchair Mobility: No   Discharge Criteria: Patient will be discharged from PT if patient refuses treatment 3 consecutive times without medical reason, if treatment goals not met, if there is a change  in medical status, if patient makes no progress towards goals or if patient is discharged from hospital.  The above assessment, treatment plan, treatment alternatives and goals were discussed and mutually agreed upon: by patient and by family  Tawana Scale , PT, DPT, NCS, CSRS  08/20/2021, 7:48 AM

## 2021-08-20 NOTE — Evaluation (Signed)
Occupational Therapy Assessment and Plan  Patient Details  Name: Justin Mann MRN: [id number] Date of Birth: Nov 19, 1945  OT Diagnosis: abnormal posture, cognitive deficits, hemiplegia affecting dominant side, and muscle weakness (generalized) Rehab Potential: Rehab Potential (ACUTE ONLY): Excellent ELOS: 12-14 days   Today's Date: 08/20/2021 OT Individual Time: 4268-3419 OT Individual Time Calculation (min): 57 min     Hospital Problem: Principal Problem:   Stroke (cerebrum) ([place]) Active Problems:   Stage 3 chronic kidney disease ([place])   Essential hypertension   Past Medical History:  Past Medical History:  Diagnosis Date   Agent orange exposure    Diabetes mellitus without complication ([place])    Hypertension    Neuropathy    Rectal cancer ([place])    Past Surgical History:  Past Surgical History:  Procedure Laterality Date   ILEOSTOMY     TONSILLECTOMY      Assessment & Plan Clinical Impression: Patient is a 76 y.o. year old male with recent admission to the hospital on 08/16/20 with right sided weakness and difficulty walking. UDS  negative. 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  Patient transferred to CIR on 08/19/2021 .    Patient currently requires min with basic self-care skills secondary to muscle weakness and muscle paralysis, impaired timing and sequencing, unbalanced muscle activation, and decreased coordination, and decreased standing balance, decreased postural control, hemiplegia, and decreased balance strategies.  Prior to hospitalization, patient could complete ADLs with modified independent .  Patient will benefit from skilled intervention to decrease level of assist with basic self-care skills and increase independence with basic self-care skills prior to discharge home with care partner.  Anticipate patient will require 24 hour supervision and follow  up home health.  OT - End of Session Activity Tolerance: Decreased this session Endurance Deficit: Yes OT Assessment Rehab Potential (ACUTE ONLY): Excellent OT Patient demonstrates impairments in the following area(s): Balance;Pain;Motor;Perception;Endurance;Sensory;Cognition;Safety OT Basic ADL's Functional Problem(s): Grooming;Bathing;Dressing;Toileting;Eating OT Advanced ADL's Functional Problem(s): Simple Meal Preparation OT Transfers Functional Problem(s): Toilet;Tub/Shower OT Additional Impairment(s): Fuctional Use of Upper Extremity OT Plan OT Intensity: Minimum of 1-2 x/day, 45 to 90 minutes OT Frequency: 5 out of 7 days OT Duration/Estimated Length of Stay: 12-14 days OT Treatment/Interventions: Balance/vestibular training;Cognitive remediation/compensation;Community reintegration;DME/adaptive equipment instruction;Discharge planning;Self Care/advanced ADL retraining;Therapeutic Activities;UE/LE Coordination activities;Functional mobility training;Patient/family education;Disease mangement/prevention;Therapeutic Exercise;Neuromuscular re-education;UE/LE Strength taining/ROM OT Self Feeding Anticipated Outcome(s): independent OT Basic Self-Care Anticipated Outcome(s): supervision OT Toileting Anticipated Outcome(s): supervision OT Bathroom Transfers Anticipated Outcome(s): supervision OT Recommendation Patient destination: Home Follow Up Recommendations: Home health OT;24 hour supervision/assistance Equipment Recommended: To be determined   OT Evaluation Precautions/Restrictions  Precautions Precautions: Fall Restrictions Weight Bearing Restrictions: No General   Vital Signs Therapy Vitals Temp: 97.9 F (36.6 C) Temp Source: Oral Pulse Rate: 72 Resp: 17 BP: (!) 128/56 Patient Position (if appropriate): Lying Oxygen Therapy SpO2: (!) 89 % O2 Device: Nasal Cannula O2 Flow Rate (L/min): 2 L/min Pain   Home Living/Prior Functioning Home Living Family/patient  expects to be discharged to:: Private residence Living Arrangements: Spouse/significant other Available Help at Discharge: Family, Available 24 hours/day Type of Home: House Home Access: Stairs to enter [employer] of Steps: 4 Entrance Stairs-Rails: Can reach both, Right, Left Home Layout: One level Bathroom Shower/Tub: Chiropodist: Standard Bathroom Accessibility: Yes Additional Comments: 1 (3") to get down to sunroom/den and then have to step back up that step to get to  kitchen  Lives With: Spouse IADL History Homemaking Responsibilities: No Current License: No Occupation: Retired Prior Function Level of Independence: Other (comment), Independent with gait, Requires assistive device for independence, Independent with transfers  Able to Take Stairs?: Yes Driving: No Vision Baseline Vision/History: 1 Wears glasses (near vision) Ability to See in Adequate Light: 0 Adequate Patient Visual Report: No change from baseline Vision Assessment?: Yes Eye Alignment: Within Functional Limits Ocular Range of Motion: Within Functional Limits Alignment/Gaze Preference: Chin down Tracking/Visual Pursuits: Decreased smoothness of horizontal tracking;Decreased smoothness of vertical tracking Saccades: Decreased speed of saccadic movement Convergence: Within functional limits Visual Fields: No apparent deficits Perception  Perception: Within Functional Limits Praxis Praxis: Intact Cognition Overall Cognitive Status: Within Functional Limits for tasks assessed Arousal/Alertness: Awake/alert Year: 2023 Month: February Day of Week: Correct Memory: Appears intact Immediate Memory Recall: Sock;Blue;Bed Memory Recall Sock: Without Cue Memory Recall Blue: Without Cue Memory Recall Bed: Without Cue Attention: Sustained;Selective Focused Attention: Appears intact Sustained Attention: Appears intact Selective Attention: Appears intact Awareness:  Impaired Awareness Impairment: Intellectual impairment (Unable to state deficits from stroke) Safety/Judgment: Appears intact Sensation Sensation Light Touch: Appears Intact Light Touch Impaired Details: Impaired LUE Hot/Cold: Appears Intact Proprioception: Appears Intact Stereognosis: Not tested Coordination Gross Motor Movements are Fluid and Coordinated: No Fine Motor Movements are Fluid and Coordinated: No Coordination and Movement Description: Slower RUE movement noted when compared to the LUE.  Pt also with history of Parkinson's. Motor  Motor Motor: Hemiplegia;Abnormal postural alignment and control Motor - Skilled Clinical Observations: Slight right hemiparesis with flexed posture in standing.  Trunk/Postural Assessment  Cervical Assessment Cervical Assessment: Exceptions to [facility] (cervical flexion at rest with decreased AROM noted in extension) Thoracic Assessment Thoracic Assessment: Exceptions to [facility] (thoracic rounding) Lumbar Assessment Lumbar Assessment: Exceptions to [facility] (posterior pelvic tilt) Postural Control Postural Control: Deficits on evaluation Postural Limitations: postural limitations in trunk, hips, knees noted in standing with hx of Parkinson's premorbidly  Balance Balance Balance Assessed: Yes Standardized Balance Assessment Standardized Balance Assessment: Berg Balance Test Berg Balance Test Sit to Stand: Needs minimal aid to stand or to stabilize Standing Unsupported: Unable to stand 30 seconds unassisted (relies on backs of legs against mat - only able to tolerate standing a total of ~30 seconds) Sitting with Back Unsupported but Feet Supported on Floor or Stool: Able to sit 2 minutes under supervision Stand to Sit: Needs assistance to sit Transfers: Needs one person to assist Standing Unsupported with Eyes Closed: Needs help to keep from falling Standing Ubsupported with Feet Together: Needs help to attain position and unable to hold for 15  seconds From Standing, Reach Forward with Outstretched Arm: Loses balance while trying/requires external support From Standing Position, Pick up Object from Floor: Unable to try/needs assist to keep balance From Standing Position, Turn to Look Behind Over each Shoulder: Needs assist to keep from losing balance and falling Turn 360 Degrees: Needs assistance while turning Standing Unsupported, Alternately Place Feet on Step/Stool: Needs assistance to keep from falling or unable to try Standing Unsupported, One Foot in Front: Loses balance while stepping or standing Standing on One Leg: Unable to try or needs assist to prevent fall Total Score: 5 Static Sitting Balance Static Sitting - Balance Support: Feet unsupported Static Sitting - Level of Assistance: 5: Stand by assistance Dynamic Sitting Balance Dynamic Sitting - Balance Support: Feet supported Dynamic Sitting - Level of Assistance: 4: Min assist (contact guard) Static Standing Balance Static Standing - Balance Support: During functional activity Static  Standing - Level of Assistance: 4: Min assist Dynamic Standing Balance Dynamic Standing - Balance Support: During functional activity Dynamic Standing - Level of Assistance: 3: Mod assist Extremity/Trunk Assessment RUE Assessment RUE Assessment: Exceptions to [facility] Active Range of Motion (AROM) Comments: shoulder flexion 0-120 degrees, all other joints AROM WFLs General Strength Comments: Pt with strength 4/5 throughout for all joints.  Slight decreased speed with functional movement compared to the left. LUE Assessment LUE Assessment: Exceptions to [facility] Active Range of Motion (AROM) Comments: Shoulder flexion 0-120 degrees, all other joints AROM WFLs General Strength Comments: strength 4+/5 with slight tremor noted at rest  [place]              Upper Body Dressing(including orthotics)            Lower  Body Dressing (excluding footwear)          Putting on/Taking off footwear             Care Tool Toileting Toileting activity   Assist for toileting: Moderate Assistance - Patient 50 - 74%     Care Tool Bed Mobility Roll left and right activity        Sit to lying activity        Lying to sitting on side of bed activity   Lying to sitting on side of bed assist level: the ability to move from lying on the back to sitting on the side of the bed with no back support.: Minimal Assistance - Patient > 75%     Care Tool Transfers Sit to stand transfer   Sit to stand assist level: Moderate Assistance - Patient 50 - 74%    Chair/bed transfer   Chair/bed transfer assist level: Moderate Assistance - Patient 50 - 74%     Toilet transfer   Assist Level: Moderate Assistance - Patient 50 - 74%     Care Tool Cognition  Expression of Ideas and Wants Expression of Ideas and Wants: 4. Without difficulty (complex and basic) - expresses complex messages without difficulty and with speech that is clear and easy to understand  Understanding Verbal and Non-Verbal Content Understanding Verbal and Non-Verbal Content: 4. Understands (complex and basic) - clear comprehension without cues or repetitions   Memory/Recall Ability Memory/Recall Ability : Current season;That he or she is in a hospital/hospital unit   Refer to Care Plan for [place] 1 OT Short Term Goal 1 (Week 1): Pt will complete LB bathing at min guard assist sit to stand shower level. OT Short Term Goal 2 (Week 1): Pt will complete LB dressing with min guard assist sit to stand. OT Short Term Goal 3 (Week 1): Pt will perform shower transfers with use of the RW and min guard to the walk-in shower in his room. OT Short Term Goal 4 (Week 1): Pt will perform toilet transfer and toileting with min guard sit to stand from the 3:1 using the RW for support.  Recommendations for other services: None     Skilled Therapeutic Intervention ADL ADL Eating: Set up Where Assessed-Eating: Bed level Grooming: Supervision/safety Where Assessed-Grooming: Wheelchair;Sitting at sink Upper Body Bathing: Supervision/safety Where Assessed-Upper Body Bathing: Wheelchair;Sitting at sink Lower Body Bathing: Minimal assistance Where Assessed-Lower Body Bathing: Edge of bed;Wheelchair Upper Body Dressing: Supervision/safety Where Assessed-Upper Body Dressing: Wheelchair Lower Body  Dressing: Minimal assistance Where Assessed-Lower Body Dressing: Wheelchair;Sitting at sink;Standing at sink Toileting: Minimal assistance Where Assessed-Toileting: Medical laboratory scientific officer: Moderate assistance Toilet Transfer Method: Counselling psychologist: Radiographer, therapeutic: Not assessed Social research officer, government: Not assessed Mobility  Bed Mobility Bed Mobility: Supine to Sit Supine to Sit: Minimal Assistance - Patient > 75% Transfers Sit to Stand: Moderate Assistance - Patient 50-74% Stand to Sit: Minimal Assistance - Patient > 75%  Session Note:  Pt worked on bathing and dressing during session at sink level per his decision.  He was able to transfer to the EOB with min assist but demonstrates moderate posterior pelvic tilt with increased posterior lean in sitting, requiring min assist to min guard.  He was able to complete sit to stand from the EOB with  mod assist and then attempted to ambulate over to the sink, but sat back down, unsuccessfully taking any steps.  RW was presented and then he was able to ambulate with mod assist with extremely flexed posture in the head, neck, and trunk.  He completed UB bathing and dressing with setup and LB bathing and dressing at min assist level secondary to having arm rests for sit to stand.  Slight tremor noted in the LUE which resulted in the need for min assist to help apply toothpaste to his toothbrush.  He was then able to complete transfer  over to the recliner with use of the RW and min assist to finish session.  Pt reports fixing something simple for breakfast and lunch at home lots of times, but not doing any extensive cooking.  Discussed expectations for 24 hr supervision level goals at discharge with 1.5-2 week LOS.  Call button and phone in reach with safety alarm pad in place.     Discharge Criteria: Patient will be discharged from OT if patient refuses treatment 3 consecutive times without medical reason, if treatment goals not met, if there is a change in medical status, if patient makes no progress towards goals or if patient is discharged from hospital.  The above assessment, treatment plan, treatment alternatives and goals were discussed and mutually agreed upon: by patient  Kanon Novosel OTR/L 08/20/2021, 4:28 PM

## 2021-08-20 NOTE — Evaluation (Signed)
Speech Language Pathology Assessment and Plan  Patient Details  Name: Justin Mann MRN: [id number] Date of Birth: 15-Dec-1945  Today's Date: 08/20/2021 SLP Individual Time: 1450-1545 SLP Individual Time Calculation (min): 68 min   Hospital Problem: Principal Problem:   Stroke (cerebrum) [facility]) Active Problems:   Stage 3 chronic kidney disease ([place])   Essential hypertension  Past Medical History:  Past Medical History:  Diagnosis Date   Agent orange exposure    Diabetes mellitus without complication ([place])    Hypertension    Neuropathy    Rectal cancer ([place])    Past Surgical History:  Past Surgical History:  Procedure Laterality Date   ILEOSTOMY     TONSILLECTOMY      Assessment / Plan / Recommendation Clinical Impression  Justin Mann is a 76 year old male with history of HTN, PD ([facility]), CKD, rectal cancer s/p APR who was admitted on 08/16/20 with right sided weakness and difficulty walking. UDS  negative. 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  Right shoulder X rays were negative for fracture. 2D echo showed EF 65-70% with no wall abnormality and mild MVR. He has a history of falling.  Pt was admitted to CIR and SLP evaluation was completed on this date with results as follows:  Pt presents with grossly intact cognitive-linguistic function.  He denies any acute cognitive changes post CVA and scored in the average range for all targeted subtests of the Cognistat standardized cognitive assessment (language subtests were omitted due to no appreciable language difficulty noted during functional conversation with therapist or reported by pt).  SLP made pt aware that [name] or OP ST services are available to him post discharge should he experience difficulty resuming his previous responsibilities in the home environment from a cognitive-linguistic standpoint.  Pt  also denies any changes in swallowing function.  Formal bedside evaluation was deferred at this time.  All questions were answered to pt's satisfaction at this time.  No further ST needs indicated at this level of care.      Skilled Therapeutic Interventions          Cognitive-linguistic evaluation completed with results and recommendations reviewed with patient.    SLP Assessment  Patient does not need any further Speech Lanaguage Pathology Services    Recommendations  Follow up Recommendations: None Equipment Recommended: None recommended by SLP           Pain Pain Assessment Pain Scale: 0-10 Pain Score: 0-No pain  Prior Functioning Cognitive/Linguistic Baseline: Within functional limits Type of Home: House  Lives With: Spouse Available Help at Discharge: Family;Available 24 hours/day Vocation: Retired  Programmer, systems Overall Cognitive Status: Within Functional Limits for tasks assessed Arousal/Alertness: Awake/alert Year: 2023 Month: February Day of Week: Correct Attention: Sustained;Selective Focused Attention: Appears intact Sustained Attention: Appears intact Selective Attention: Appears intact Memory: Appears intact Immediate Memory Recall: Sock;Blue;Bed Memory Recall Sock: Without Cue Memory Recall Blue: Without Cue Memory Recall Bed: Without Cue Awareness: Impaired Awareness Impairment: Intellectual impairment (Unable to state deficits from stroke) Safety/Judgment: Appears intact  Comprehension Auditory Comprehension Overall Auditory Comprehension: Appears within functional limits for tasks assessed Expression Expression Primary Mode of Expression: Verbal Verbal Expression Overall Verbal Expression: Appears within functional limits for tasks assessed Written Expression Dominant Hand: Right Oral Motor Oral Motor/Sensory Function Overall Oral Motor/Sensory Function: Within functional limits Motor Speech Overall Motor Speech: Appears within  functional  limits for tasks assessed  Care Tool Care Tool Cognition Ability to hear (with hearing aid or hearing appliances if normally used Ability to hear (with hearing aid or hearing appliances if normally used): 0. Adequate - no difficulty in normal conservation, social interaction, listening to TV   Expression of Ideas and Wants Expression of Ideas and Wants: 4. Without difficulty (complex and basic) - expresses complex messages without difficulty and with speech that is clear and easy to understand   Understanding Verbal and Non-Verbal Content Understanding Verbal and Non-Verbal Content: 4. Understands (complex and basic) - clear comprehension without cues or repetitions  Memory/Recall Ability Memory/Recall Ability : Current season;That he or she is in a hospital/hospital unit   Refer to Care Plan for Long Term Goals  Recommendations for other services: None   Discharge Criteria: Patient will be discharged from SLP if patient refuses treatment 3 consecutive times without medical reason, if treatment goals not met, if there is a change in medical status, if patient makes no progress towards goals or if patient is discharged from hospital.  The above assessment, treatment plan, treatment alternatives and goals were discussed and mutually agreed upon: by patient  Emilio Math 08/20/2021, 4:35 PM

## 2021-08-20 NOTE — Progress Notes (Signed)
Physical Therapy Session Note  Patient Details  Name: Justin Mann MRN: [id number] Date of Birth: 02-Feb-1946  Today's Date: 08/20/2021 PT Individual Time: 1535-1603 PT Individual Time Calculation (min): 28 min   Short Term Goals: Week 1:  PT Short Term Goal 1 (Week 1): Pt will perform supine<>sit with CGA PT Short Term Goal 2 (Week 1): Pt will perform sit<>stands using LRAD with min assist consistently PT Short Term Goal 3 (Week 1): Pt will perform bed<>chair transfers using LRAD with min assist consistently PT Short Term Goal 4 (Week 1): Pt will ambulate at least 60ft using LRAD with min assist PT Short Term Goal 5 (Week 1): Pt will navigate up/down a step using LRAD with min assist to simulate step inside home  Skilled Therapeutic Interventions/Progress Updates:    Pt received supine in bed and agreeable to therapy session. Supine>sitting L EOB, HOB partially elevated, with supervision and slightly increased time/effort. Sitting EOB donned tennis shoes set-up assist but tied total assist for time management. Sit>stand EOB>RW requiring 2nd attempt to rise up into standing with cuing for increased anterior trunk flexion/weight shift while powering up hips followed by trunk/hip extension as pt tends to remain in flexed posture with downward gaze - light mod assist for rising and balance. Gait training ~34ft to w/c using RW with min assist for balance - demos slight improvement in gait mechanics compared to this AM with increased R LE foot clearance - continues to require cuing for safe AD management when turning as pt tends to push it too far outside his BOS.   Transported to/from gym in w/c for time management and energy conservation.  Simulated ambulatory car transfer to/from sedan ([employer] crosstreck) height with R UE support around therapist's shoulders to attempt gait without AD because pt reporting PTA he did not use an AD to walk to/from vehicle - mod assist for balance as pt has  shuffling/festination gait with increased anterior trunk flexion.  Patient participated in [facility] and demonstrates increased fall risk as noted by score of   5/56.  (<36= high risk for falls, close to 100%; 37-45 significant >80%; 46-51 moderate >50%; 52-55 lower >25%). Pt becomes fatigued during assessment with reports of increased R knee pain this afternoon requiring frequent seated rest break and limiting participation in test items.   Transported back to room and pt requesting to return to bed. L stand pivot w/c>EOB using RW with light mod assist for rising to stand and balance while turning. Sit>supine with CGA for safety. Pt left supine in bed with needs in reach and bed alarm on.  Therapy Documentation Precautions:  Precautions Precautions: Fall, Other (comment) Precaution Comments: increased falls in recent hx; colostomy bag, R hemi (LE>UE) Restrictions Weight Bearing Restrictions: No   Pain:   Reports R knee pain rated as 6/10 that appears to increase during session with [place] activities - provided seated rest break, distraction, and repositioning for pain management.  Balance: Standardized Balance Assessment Standardized Balance Assessment: Berg Balance Test Berg Balance Test Sit to Stand: Needs minimal aid to stand or to stabilize Standing Unsupported: Unable to stand 30 seconds unassisted (relies on backs of legs against mat - only able to tolerate standing a total of ~30 seconds) Sitting with Back Unsupported but Feet Supported on Floor or Stool: Able to sit 2 minutes under supervision Stand to Sit: Needs assistance to sit Transfers: Needs one person to assist Standing Unsupported with Eyes Closed: Needs help to keep from falling Standing  Ubsupported with Feet Together: Needs help to attain position and unable to hold for 15 seconds From Standing, Reach Forward with Outstretched Arm: Loses balance while trying/requires external support From Standing Position,  Pick up Object from Floor: Unable to try/needs assist to keep balance From Standing Position, Turn to Look Behind Over each Shoulder: Needs assist to keep from losing balance and falling Turn 360 Degrees: Needs assistance while turning Standing Unsupported, Alternately Place Feet on Step/Stool: Needs assistance to keep from falling or unable to try Standing Unsupported, One Foot in Front: Loses balance while stepping or standing Standing on One Leg: Unable to try or needs assist to prevent fall Total Score: 5      Therapy/Group: Individual Therapy  Tawana Scale , PT, DPT, NCS, CSRS 08/20/2021, 3:55 PM

## 2021-08-21 NOTE — Plan of Care (Signed)
°  Problem: Consults Goal: RH STROKE PATIENT EDUCATION Description: See Patient Education module for education specifics  Outcome: Progressing   Problem: RH BOWEL ELIMINATION Goal: RH STG MANAGE BOWEL WITH ASSISTANCE Description: STG Manage Bowel with mod I  Assistance. Outcome: Progressing Goal: RH STG MANAGE BOWEL W/MEDICATION W/ASSISTANCE Description: STG Manage Bowel with Medication with  mod I Assistance. Outcome: Progressing   Problem: RH BLADDER ELIMINATION Goal: RH STG MANAGE BLADDER WITH ASSISTANCE Description: STG Manage Bladder With mod I Assistance Outcome: Progressing   Problem: RH SAFETY Goal: RH STG ADHERE TO SAFETY PRECAUTIONS W/ASSISTANCE/DEVICE Description: STG Adhere to Safety Precautions With cues Assistance/Device. Outcome: Progressing   Problem: RH PAIN MANAGEMENT Goal: RH STG PAIN MANAGED AT OR BELOW PT'S PAIN GOAL Description: At or below level 4 with prns Outcome: Progressing   Problem: RH KNOWLEDGE DEFICIT Goal: RH STG INCREASE KNOWLEDGE OF DIABETES Description: Patient and spouse will be able to manage DM with medications and dietary modifications using handouts and educational resources independently Outcome: Progressing Goal: RH STG INCREASE KNOWLEDGE OF HYPERTENSION Description: Patient and spouse will be able to manage HTN with medications and dietary modifications using handouts and educational resources independently Outcome: Progressing Goal: RH STG INCREASE KNOWLEGDE OF HYPERLIPIDEMIA Description: Patient and spouse will be able to manage HLD with medications and dietary modifications using handouts and educational resources independently Outcome: Progressing Goal: RH STG INCREASE KNOWLEDGE OF STROKE PROPHYLAXIS Description: Patient and spouse will be able to manage secondary stroke risks with medications and dietary modifications using handouts and educational resources independently Outcome: Progressing

## 2021-08-21 NOTE — Progress Notes (Signed)
Physical Therapy Session Note  Patient Details  Name: Justin Mann MRN: [id number] Date of Birth: 05-22-1946  Today's Date: 08/21/2021 PT Individual Time: 0807-0904 PT Individual Time Calculation (min): 57 min   Short Term Goals: Week 1:  PT Short Term Goal 1 (Week 1): Pt will perform supine<>sit with CGA PT Short Term Goal 2 (Week 1): Pt will perform sit<>stands using LRAD with min assist consistently PT Short Term Goal 3 (Week 1): Pt will perform bed<>chair transfers using LRAD with min assist consistently PT Short Term Goal 4 (Week 1): Pt will ambulate at least 7ft using LRAD with min assist PT Short Term Goal 5 (Week 1): Pt will navigate up/down a step using LRAD with min assist to simulate step inside home  Skilled Therapeutic Interventions/Progress Updates:    Pt received supine in bed and agreeable to therapy session. Supine>sitting L EOB, HOB partially elevated and relying on bedrails to bring trunk upright, with supervision and significantly increased time and effort. Once sitting EOB, pt repeatedly has posterior trunk LOB requiring intermittent light mod assist to come back upright despite using consistent UE support on bedrail. Sitting, EOB pt doffed dirty shirt, donned clean shirt, and threaded on pants with set-up assist - donned shoes total assist due to the repeated posterior trunk LOB. Sit>stand EOB>RW with min assist primarily to facilitate increased anterior weight shift while rising. Standing with min assist due to posterior lean (pt relying on backs of legs against bed for balance) while pulling pants up over hips without assist. Short distance ~65ft ambulatory transfer to w/c using RW with min assist for balance - slow gait speed with excessive anterior trunk flexion and decreased R LE foot clearance - cuing to turn and step back fully to seat prior to sitting down.   Sitting in w/c at sink washed face and performed oral care with set-up assist.  Transported to/from gym in w/c  for time management and energy conservation. Therapist adjusted back angle of w/c for improved spine alignment; however, pt reports he is more comfortable sitting in the oversized recliner in his room - therapist educated him on importance of sitting with normal spine alignment as he has excessively rounded posture and posterior pelvic tilt but pt with minimal receptiveness to this education.  Sit>stand w/c>R UE support around therapist's shoulders with min assist - continues to have delayed trunk/hip extension for upright posture - remains in a thoracic rounded posture throughout with excessive posterior pelvic tilt and excessive B LE hip external rotation.  Gait training ~16ft x2 using R UE support around therapist's shoulders - pt sustains the just mentioned posture throughout gait and has decreased B LE step lengths (R more impaired) and decreased B LE foot clearance (R more impaired) - cuing for improvement throughout. Pt continues to fatigue quickly requiring cuing to turn and back up fully prior to initiating sitting.  Stair navigation training ascending/descending 4 steps (6" height) using B HRs with step-to pattern leading with L LE on ascent and R LE on descent (pt requires 2 attempts to place R foot fully up onto step each time) - min assist for balance.   Transported back to room. Short distance ~81ft ambulatory transfer w/c>recliner using RW with min assist and continued above gait deviations. Pt received a phone call while walking and quickly hurried to reach for phone demoing decreased safety awareness and impaired dual-task as pt starts to slouch down in a crouched posture while talking on the phone - therapist cued for  improved upright posture and to ask pt to hold while he safely turned and sat down prior to continuing the phone call. At end of session, pt left seated in recliner with needs in reach, LEs elevated, and chair alarm on.   Therapy Documentation Precautions:   Precautions Precautions: Fall, Other (comment) Precaution Comments: increased falls in recent hx; colostomy bag, R hemi (LE>UE) Restrictions Weight Bearing Restrictions: No   Pain: Continues to have R knee pain/discomfort rated as 7/10 - pt states it is due to a fall he sustained a while ago and declines medication at this time - rest breaks provided and performed gentle AROM during session for pain management.    Therapy/Group: Individual Therapy  Tawana Scale , PT, DPT, NCS, CSRS  08/21/2021, 7:47 AM

## 2021-08-22 LAB — BASIC METABOLIC PANEL
Anion gap: 8 (ref 5–15)
BUN: 34 mg/dL — ABNORMAL HIGH (ref 8–23)
CO2: 26 mmol/L (ref 22–32)
Calcium: 8.6 mg/dL — ABNORMAL LOW (ref 8.9–10.3)
Chloride: 106 mmol/L (ref 98–111)
Creatinine, Ser: 2.04 mg/dL — ABNORMAL HIGH (ref 0.61–1.24)
GFR, Estimated: 34 mL/min — ABNORMAL LOW (ref >60)
Glucose, Bld: 155 mg/dL — ABNORMAL HIGH (ref 70–99)
Potassium: 3.8 mmol/L (ref 3.5–5.1)
Sodium: 140 mmol/L (ref 135–145)

## 2021-08-22 LAB — CBC
HCT: 31.3 % — ABNORMAL LOW (ref 39.0–52.0)
Hemoglobin: 10.6 g/dL — ABNORMAL LOW (ref 13.0–17.0)
MCH: 29.1 pg (ref 26.0–34.0)
MCHC: 33.9 g/dL (ref 30.0–36.0)
MCV: 86 fL (ref 80.0–100.0)
Platelets: 203 10*3/uL (ref 150–400)
RBC: 3.64 MIL/uL — ABNORMAL LOW (ref 4.22–5.81)
RDW: 13.2 % (ref 11.5–15.5)
WBC: 7.1 10*3/uL (ref 4.0–10.5)
nRBC: 0 % (ref 0.0–0.2)

## 2021-08-22 MED ORDER — ACETAMINOPHEN 325 MG PO TABS: 325.0000 mg | ORAL_TABLET | ORAL | Status: AC | PRN

## 2021-08-22 MED ORDER — TAMSULOSIN HCL 0.4 MG PO CAPS
0.4000 mg | ORAL_CAPSULE | Freq: Every day | ORAL | Status: DC
  Administered 2021-08-22 – 2021-08-29 (×8): 0.4 mg via ORAL
  Filled 2021-08-22 (×8): qty 1

## 2021-08-22 MED ORDER — AMLODIPINE BESYLATE 5 MG PO TABS
5.0000 mg | ORAL_TABLET | Freq: Every day | ORAL | Status: DC
  Administered 2021-08-23 – 2021-08-25 (×3): 5 mg via ORAL
  Filled 2021-08-22 (×3): qty 1

## 2021-08-22 MED ORDER — POLYETHYLENE GLYCOL 3350 17 G PO PACK
17.0000 g | PACK | Freq: Every day | ORAL | Status: DC
  Administered 2021-08-22 – 2021-08-28 (×6): 17 g via ORAL
  Filled 2021-08-22 (×7): qty 1

## 2021-08-22 NOTE — Progress Notes (Addendum)
Physical Therapy Session Note  Patient Details  Name: Justin Mann MRN: [id number] Date of Birth: 1945/08/31  Today's Date: 08/22/2021 PT Individual Time: 0832-0944 PT Individual Time Calculation (min): 72 min   Short Term Goals: Week 1:  PT Short Term Goal 1 (Week 1): Pt will perform supine<>sit with CGA PT Short Term Goal 2 (Week 1): Pt will perform sit<>stands using LRAD with min assist consistently PT Short Term Goal 3 (Week 1): Pt will perform bed<>chair transfers using LRAD with min assist consistently PT Short Term Goal 4 (Week 1): Pt will ambulate at least 27ft using LRAD with min assist PT Short Term Goal 5 (Week 1): Pt will navigate up/down a step using LRAD with min assist to simulate step inside home Week 2:     Skilled Therapeutic Interventions/Progress Updates:    AM SESSION Pain - pt states mild shoulder pain at baseline/premorbid, denies need for intervention.  Pt initially supine, incontinent of urine requiring assist.  Supine to sit on edge of bed w/HOB elevated and use of rail + mod assist of 1 due to post lean.  Worked on ant wt shift to achieve improved midline, sits w/heavy post pelvic rotation and rounded shoulders.  Doffs shirt and dons clean shirt w/min assist/max cues for midline due to post tendency. Sit to stand to RW w/mod assist and heavy cueing for ant wt shift and to achieve upright posture, post/R lean. Doffs soiled brief w/max assist. In sitting able to wash perinal area w/set up and max cueing to adjust post lean/tendency.  Pt w/phone call during activity and reaches behind himself w/heavy post tendency/max cues to prevent LOB, unable to dual task wt shifting during phone call, mod assist for balance. Dons clean brief and pants to thighs w/mod assist and continued cueing for posture/ant wt shift adjustments. Sit to stand w/mod assist and raises brief/pants w/max assist. In sitting dons shoes w/max assist then leans forward to shoes and ties w/supervision.   Improved midline following extended forward flexion. Sit to stand w/mod assist to RW  stand pivot transfer to wc w/min assist.  From wc level, pt able to wash face, comb hair, brush teeth w/set up only.  Pt transported to gym.  Sit to stand to RW w/min assist, cues for full upright posture. Gait 63ft w/RW w/min assist and cues for increased clearance RLE, shuffling gait w/significantly decreased step length/ht/absence of heel strike RLE, mild knee instability, mild R lean.   Heel taps to 2in step using RLE to promote heelstrike.  6 reps x   2  sets w/seated rest breaks required between efforts.   Standing alternating crossbody reach for targets placed chet height and above - results in gradually progressive R lean, pt unable to self correct w/multimodal cues including mirror.  Minimally receptive to education re: CVA deficits/rehab/purpose of NMRE stating "I don't think I was straight before".  dURING seated rest break, pt became less responsive.  Eyes open but only grunting as response continueing to fade.  Called for assist.  Pt total lift to mat, legs elevated, vitals assessed by medical team and found to be WNLs.  PA assessed pt, pt returned to baseline MS, PA ordered EKG and Labs but stated likely due to fatigue.  Pt supine to sit and stand pivot transfer to wc w/min assist.  Handed off to nursing.  PM session 45 min missed visit due to above episode due to fatigue/workup pending.  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

## 2021-08-22 NOTE — Progress Notes (Signed)
[facility] Individual Statement of Services  Patient Name:  Justin Mann  Date:  08/22/2021  Welcome to the [facility].  Our goal is to provide you with an individualized program based on your diagnosis and situation, designed to meet your specific needs.  With this comprehensive rehabilitation program, you will be expected to participate in at least 3 hours of rehabilitation therapies Monday-Friday, with modified therapy programming on the weekends.  Your rehabilitation program will include the following services:  Physical Therapy (PT), Occupational Therapy (OT), Speech Therapy (ST), 24 hour per day rehabilitation nursing, Therapeutic Recreaction (TR), Neuropsychology, Care Coordinator, Rehabilitation Medicine, Nutrition Services, Pharmacy Services, and Other  Weekly team conferences will be held on Wednesdays to discuss your progress.  Your Inpatient Rehabilitation Care Coordinator will talk with you frequently to get your input and to update you on team discussions.  Team conferences with you and your family in attendance may also be held.  Expected length of stay:  5-7 Days  Overall anticipated outcome:  Supervision  Depending on your progress and recovery, your program may change. Your Inpatient Rehabilitation Care Coordinator will coordinate services and will keep you informed of any changes. Your Inpatient Rehabilitation Care Coordinator's name and contact numbers are listed  below.  The following services may also be recommended but are not provided by the [facility]:   [place] will be made to provide these services after discharge if needed.  Arrangements include referral to agencies that provide these services.  Your insurance has been verified to be:   [place]  Your primary doctor is:  [facility])  Pertinent information will be  shared with your doctor and your insurance company.  Inpatient Rehabilitation Care Coordinator:  Erlene Quan, [place] or [phone]  Information discussed with and copy given to patient by: [name], 08/22/2021, 11:14 AM

## 2021-08-22 NOTE — Progress Notes (Signed)
PHYSICAL MEDICINE & REHABILITATION PROGRESS NOTE  Subjective/Complaints:  No issues overnite except incont  ROS: Denies CP, SOB, N/V/D  Objective: Vital Signs: Blood pressure (!) 171/81, pulse 60, temperature 98.1 F (36.7 C), temperature source Oral, resp. rate 16, height 5\' 10"  (1.778 m), weight 88.3 kg, SpO2 96 %. No results found. Recent Labs    08/20/21 0639 08/22/21 0742  WBC 7.0 7.1  HGB 10.3* 10.6*  HCT 31.5* 31.3*  PLT 206 203    Recent Labs    08/20/21 0639  NA 139  K 3.8  CL 107  CO2 25  GLUCOSE 126*  BUN 38*  CREATININE 2.12*  CALCIUM 9.0     Intake/Output Summary (Last 24 hours) at 08/22/2021 3016 Last data filed at 08/21/2021 1809 Gross per 24 hour  Intake 399 ml  Output 650 ml  Net -251 ml         Physical Exam: BP (!) 171/81 (BP Location: Right Arm)    Pulse 60    Temp 98.1 F (36.7 C) (Oral)    Resp 16    Ht 5\' 10"  (1.778 m)    Wt 88.3 kg    SpO2 96%    BMI 27.93 kg/m   General: No acute distress Mood and affect are appropriate Heart: Regular rate and rhythm no rubs murmurs or extra sounds Lungs: Clear to auscultation, breathing unlabored, no rales or wheezes Abdomen: Positive bowel sounds, soft nontender to palpation, nondistended Extremities: No clubbing, cyanosis, or edema Skin: No evidence of breakdown, no evidence of rash   Psych: Flat. Musc: No edema in extremities.  No tenderness in extremities. Neuro: Alert Motor: LUE/LLE: 5/5 proximal distally on RUE: 5/5 proximal distal RLE: 4/5 proximal distal Mild ataxia R FNF  Assessment/Plan: 1. Functional deficits which require 3+ hours per day of interdisciplinary therapy in a comprehensive inpatient rehab setting. Physiatrist is providing close team supervision and 24 hour management of active medical problems listed below. Physiatrist and rehab team continue to assess barriers to discharge/monitor patient progress toward functional and medical goals   Care  Tool:  Bathing    Body parts bathed by patient: Right arm, Left upper leg         Bathing assist Assist Level: Minimal Assistance - Patient > 75%     Upper Body Dressing/Undressing Upper body dressing   What is the patient wearing?: Pull over shirt    Upper body assist Assist Level: Set up assist    Lower Body Dressing/Undressing Lower body dressing      What is the patient wearing?: Pants     Lower body assist Assist for lower body dressing: Minimal Assistance - Patient > 75%     Toileting Toileting    Toileting assist Assist for toileting: Moderate Assistance - Patient 50 - 74% Assistive Device Comment: urinal   Transfers Chair/bed transfer  Transfers assist     Chair/bed transfer assist level: Minimal Assistance - Patient > 75% Chair/bed transfer assistive device: Programmer, multimedia   Ambulation assist   Ambulation activity did not occur:  (requires use of RW for safety when pt only intermittently used rollator at home)  Assist level: Minimal Assistance - Patient > 75% Assistive device: Hand held assist Max distance: 50ft   Walk 10 feet activity   Assist     Assist level: Moderate Assistance - Patient - 50 - 74% Assistive device: Walker-rolling   Walk 50 feet activity   Assist    Assist level:  Moderate Assistance - Patient - 50 - 74% Assistive device: Walker-rolling    Walk 150 feet activity   Assist Walk 150 feet activity did not occur: Safety/medical concerns         Walk 10 feet on uneven surface  activity   Assist Walk 10 feet on uneven surfaces activity did not occur: Safety/medical concerns         Wheelchair     Assist Is the patient using a wheelchair?: Yes (used for transportation to/from therapy gym) Type of Wheelchair: Manual    Wheelchair assist level: Dependent - Patient 0%      Wheelchair 50 feet with 2 turns activity    Assist        Assist Level: Dependent - Patient 0%    Wheelchair 150 feet activity     Assist      Assist Level: Dependent - Patient 0%    Medical Problem List and Plan: 1. 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  Antithrombotics: -DVT/anticoagulation:  Pharmaceutical: Lovenox             -antiplatelet therapy: DAPT X 3 weeks followed by Plavix 3. Pain Management: tylenol prn 4. Mood: Lcsw to follow for evaluation and support             -antipsychotic agents: N/A 5. Neuropsych: This patient is  capable of making decisions on his own behalf. 6. Skin/Wound Care: Routine pressure relief measures. 7. Fluids/Electrolytes/Nutrition: Monitor I/Os.  8. HTN: Permissive HTN with slow titration of BP meds, continue amlodipine   Vitals:   08/21/21 1948 08/22/21 0524  BP: (!) 159/81 (!) 171/81  Pulse: 65 60  Resp: 14 16  Temp: 98.3 F (36.8 C) 98.1 F (36.7 C)  SpO2: 96% 96%  Mild elevation will likely need titration this week   9. CKD: Avoid nephrotoxic meds and hypotensive episodes             --baseline SCr-1.95 to 2.08.    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 Nocturia: Gets up every 2 hours at nights. Has seen urology in the past. Continue flomax 0.8mg  HS.              --will use condom cath at nights to help with sleep.  12. Parkinson's Disease: continue Sinemet 25-100mg  2 tablets 4 times per day.   13. Insomnia: continue trazodone prn HS   LOS: 3 days A FACE TO FACE EVALUATION WAS PERFORMED  Charlett Blake 08/22/2021, 8:21 AM

## 2021-08-22 NOTE — Progress Notes (Signed)
Inpatient Rehabilitation  Patient information reviewed and entered into eRehab system by Rital Cavey M. Lorrin Bodner, M.A., CCC/SLP, PPS Coordinator.  Information including medical coding, functional ability and quality indicators will be reviewed and updated through discharge.    

## 2021-08-22 NOTE — Discharge Instructions (Addendum)
Inpatient Rehab Discharge Instructions  Justin Mann Discharge date and time:    Activities/Precautions/ Functional Status: Activity: no lifting, driving, or strenuous exercise till cleared by MD Diet: cardiac diet and diabetic diet Wound Care:    Functional status:  ___ No restrictions     ___ Walk up steps independently _X__ 24/7 supervision/assistance   ___ Walk up steps with assistance ___ Intermittent supervision/assistance  ___ Bathe/dress independently ___ Walk with walker     ___ Bathe/dress with assistance ___ Walk Independently    ___ Shower independently ___ Walk with assistance    __X_ Shower with assistance _X__ No alcohol   ___ Return to work/school ________  COMMUNITY REFERRALS UPON DISCHARGE:    Outpatient: PT     OT                Agency: [facility]  Phone: [phone]              Appointment Date/Time: TBD  Medical Equipment/Items Ordered: Vassie Moselle                                                  Agency/Supplier: [facility] [phone]  Special Instructions:  STROKE/TIA DISCHARGE INSTRUCTIONS SMOKING Cigarette smoking nearly doubles your risk of having a stroke & is the single most alterable risk factor  If you smoke or have smoked in the last 12 months, you are advised to quit smoking for your health. Most of the excess cardiovascular risk related to smoking disappears within a year of stopping. Ask you doctor about anti-smoking medications [place] Quit Line: 1-800-QUIT NOW Free Smoking Cessation Classes [phone]  CHOLESTEROL Know your levels; limit fat & cholesterol in your diet  Lipid Panel     Component Value Date/Time   CHOL 231 (H) 08/16/2021 0024   TRIG 228 (H) 08/16/2021 0024   HDL 39 (L) 08/16/2021 0024   CHOLHDL 5.9 08/16/2021 0024   VLDL 46 (H) 08/16/2021 0024   LDLCALC 146 (H) 08/16/2021 0024     Many patients benefit from treatment even if their cholesterol is at goal. Goal: Total Cholesterol (CHOL)  less than 160 Goal:  Triglycerides (TRIG) less than 150 Goal:  HDL greater than 40 Goal:  LDL (LDLCALC) less than 100   BLOOD PRESSURE American Stroke Association blood pressure target is less that 120/80 mm/Hg  Your discharge blood pressure is:  BP: (!) 161/75 Monitor your blood pressure Limit your salt and alcohol intake Many individuals will require more than one medication for high blood pressure  DIABETES (A1c is a blood sugar average for last 3 months) Goal HGBA1c is under 7% (HBGA1c is blood sugar average for last 3 months)  Diabetes:     Lab Results  Component Value Date   HGBA1C 6.3 (H) 08/16/2021    Your HGBA1c can be lowered with medications, healthy diet, and exercise. Check your blood sugar as directed by your physician Call your physician if you experience unexplained or low blood sugars.  PHYSICAL ACTIVITY/REHABILITATION Goal is 30 minutes at least 4 days per week  Activity: No driving, Therapies: see above Return to work: N/A Activity decreases your risk of heart attack and stroke and makes your heart stronger.  It helps control your weight and blood pressure; helps you relax and can improve your mood. Participate in a regular  exercise program. Talk with your doctor about the best form of exercise for you (dancing, walking, swimming, cycling).  DIET/WEIGHT Goal is to maintain a healthy weight  Your discharge diet is:  Diet Order             Diet Heart Room service appropriate? Yes; Fluid consistency: Thin  Diet effective now                   liquids Your height is:  Height: 5\' 10"  (177.8 cm) Your current weight is: Weight: 88.3 kg Your Body Mass Index (BMI) is:  BMI (Calculated): 27.93 Following the type of diet specifically designed for you will help prevent another stroke. Your goal weight range is:   Your goal Body Mass Index (BMI) is 19-24. Healthy food habits can help reduce 3 risk factors for stroke:  High cholesterol, hypertension, and excess  weight.  RESOURCES Stroke/Support Group:  Call [phone]   STROKE EDUCATION PROVIDED/REVIEWED AND GIVEN TO PATIENT Stroke warning signs and symptoms How to activate emergency medical system (call 911). Medications prescribed at discharge. Need for follow-up after discharge. Personal risk factors for stroke. Pneumonia vaccine given:  Flu vaccine given:  My questions have been answered, the writing is legible, and I understand these instructions.  I will adhere to these goals & educational materials that have been provided to me after my discharge from the hospital.      My questions have been answered and I understand these instructions. I will adhere to these goals and the provided educational materials after my discharge from the hospital.  Patient/Caregiver Signature _______________________________ Date __________  Clinician Signature _______________________________________ Date __________  Please bring this form and your medication list with you to all your follow-up doctor's appointments.

## 2021-08-22 NOTE — Progress Notes (Signed)
During therapy this am, patient became sitting at edge of mat when he got quiest and  unresponsive for a couple of minutes? followed by syncopal episode. He was unresponsive and was laid down and legs elevated. On exam, patient aroused in a few seconds with stimulation and denied any CP, dizziness, HA or SOB. No diaphoretic with regular HR.  HR was in 60's and BP-152/84. EKG showed concerns of bifascular block. Contacted Dr. Harrell Gave to review EKG and discuss events of this am---> she questioned if patient had a pause but did not feel that there were any significant changes, no trops needed as patient not symptomatic and that PR interval wider but not concerning. She recommended monitor tor work up at discharge.    Patient does has PD-->will order orthostatic changes and change timing of Norvasc to evenings. Labs show CKD at baseline-->encouraged patient to increase fluid intake and miralax added as stool formed in colostomy. Will continue to monitor for now.

## 2021-08-22 NOTE — Progress Notes (Signed)
Inpatient Rehabilitation Care Coordinator °Assessment and Plan °Patient Details  °Name: Justin Mann °MRN: [id number] °Date of Birth: 07/26/1945 ° °Today's Date: 08/22/2021 ° °Hospital Problems: Principal Problem: °  Stroke (cerebrum) (HCC) °Active Problems: °  Stage 3 chronic kidney disease (HCC) °  Essential hypertension ° °Past Medical History:  °Past Medical History:  °Diagnosis Date  ° Agent orange exposure   ° Diabetes mellitus without complication (HCC)   ° Hypertension   ° Neuropathy   ° Rectal cancer (HCC)   ° °Past Surgical History:  °Past Surgical History:  °Procedure Laterality Date  ° ILEOSTOMY    ° TONSILLECTOMY    ° °Social History:  reports that he has never smoked. He does not have any smokeless tobacco history on file. He reports current alcohol use. No history on file for drug use. ° °Family / Support Systems °Patient Roles: Spouse °Anticipated Caregiver: MARY Moraes °Ability/Limitations of Caregiver: MIN A °Caregiver Availability: 24/7 °Family Dynamics: support from spouse ° °Social History °Preferred language: English °Religion:  °Health Literacy - How often do you need to have someone help you when you read instructions, pamphlets, or other written material from your doctor or pharmacy?: Never °Writes: Yes °Legal History/Current Legal Issues: n/a °Guardian/Conservator: n/a  ° °Abuse/Neglect °Abuse/Neglect Assessment Can Be Completed: Yes °Physical Abuse: Denies °Verbal Abuse: Denies °Sexual Abuse: Denies °Exploitation of patient/patient's resources: Denies °Self-Neglect: Denies ° °Patient response to: °Social Isolation - How often do you feel lonely or isolated from those around you?: Never ° °Emotional Status °Recent Psychosocial Issues: coping °Psychiatric History: n/a °Substance Abuse History: n/a ° °Patient / Family Perceptions, Expectations & Goals °Pt/Family understanding of illness & functional limitations: yes °Premorbid pt/family roles/activities: patient previosuly active in the community  and independent °Anticipated changes in roles/activities/participation: spouse able to provide up to Min A. Spouse able to assit with roles and tasks °Pt/family expectations/goals: supervision ° °Community Resources °Community Agencies: None °Premorbid Home Care/DME Agencies: Other (Comment) (Shower seat, grab bars, rollator) °Transportation available at discharge: spouse able to transport °Is the patient able to respond to transportation needs?: Yes °In the past 12 months, has lack of transportation kept you from medical appointments or from getting medications?: No °In the past 12 months, has lack of transportation kept you from meetings, work, or from getting things needed for daily living?: No °Resource referrals recommended: Neuropsychology ° °Discharge Planning °Living Arrangements: Spouse/significant other °Support Systems: Spouse/significant other °Type of Residence: Private residence °Insurance Resources: Private Insurance (specify) ([place] & [place]) °Financial Resources: Family Support °Financial Screen Referred: No °Living Expenses: Lives with family °Money Management: Patient, Spouse °Does the patient have any problems obtaining your medications?: No °Home Management: independent °Patient/Family Preliminary Plans: anticipatinf supervision goals. Spouse able to to assist with money and medication management °Care Coordinator Barriers to Discharge: Neurogenic Bowel & Bladder, Lack of/limited family support °Care Coordinator Anticipated Follow Up Needs: HH/OP °Expected length of stay: 5-7 days ° °Clinical Impression °SW met with patient and spouse. Introduced self, explained role and addressed questions and concerns. Sw will continue to follow up.  ° °Christina J Baskerville °08/22/2021, 12:51 PM ° °  °

## 2021-08-22 NOTE — Progress Notes (Signed)
Patient ID: Justin Mann, male   DOB: 04/24/1946, 76 y.o.   MRN: [id number] Met with the patient to introduce self, review rehab process, team conference and plan of care. Discussed secondary risks and medications. Also reviewed dietary modification recommendations and HH diet tips. Patient encouraged to increase po fluid intake after syncopal episode earlier; reported will just make him have to use the bathroom more. Reported pain in right shoulder. Concerned about scripts for discharge; wants to be sure everything goes through the [place] in [place]; used to going online to renew scripts and they are mailed to him. No other concerns noted at present. Continue to follow along to discharge to address educational needs. Collaborate with the team to facilitate preparation for discharge home with spouse. Margarito Liner

## 2021-08-22 NOTE — Progress Notes (Signed)
Occupational Therapy Session Note  Patient Details  Name: Justin Mann MRN: [id number] Date of Birth: 12-24-1945  Today's Date: 08/22/2021 OT Individual Time: 1101-1200 OT Individual Time Calculation (min): 59 min    Short Term Goals: Week 1:  OT Short Term Goal 1 (Week 1): Pt will complete LB bathing at min guard assist sit to stand shower level. OT Short Term Goal 2 (Week 1): Pt will complete LB dressing with min guard assist sit to stand. OT Short Term Goal 3 (Week 1): Pt will perform shower transfers with use of the RW and min guard to the walk-in shower in his room. OT Short Term Goal 4 (Week 1): Pt will perform toilet transfer and toileting with min guard sit to stand from the 3:1 using the RW for support.  Skilled Therapeutic Interventions/Progress Updates:  Pt greeted seated in recliner agreeable to OT intervention, pt recalling events of syncopal episode this AM. Orthostatic vitals taken at start of session: Seated in recliner legs down: 159/86 HR 59 Standing: 130/77 HR 61 bpm, pt reports no dizziness/lightheadedness  Pt sit>stand again with MIN A, pt agreeable to ambulate to bathroom with rw and MIN A. CGA for 3/3 toileting tasks -urine void. Pt ambulated to w/c with MIN A with RW.  Assessed RUE coordination via 9 hole peg test: RUE- 41 secs LUE- 40 secs Explained scores in relation to norms for [facility] with pt verbalizing understanding. Pt again reports need to void bladder, MINA for ambulatory toilet transfer with RW, CGA for 3/3 toileting tasks +urine void this time, MIN A to ambulate to EOB. Pt emptied colostomy with MIN A needed to hold canister while pt dumped colostomy. Left canister in bathroom to be assess by RN. Pt left supine in bed with bed alarm activated and all needs within reach.  Therapy Documentation Precautions:  Precautions Precautions: Fall, Other (comment) Precaution Comments: increased falls in recent hx; colostomy bag, R hemi (LE>UE) Restrictions Weight  Bearing Restrictions: No  Pain: 5/10 HA, rest breaks provided as needed.     Therapy/Group: Individual Therapy  Corinne Ports [facility] 08/22/2021, 12:22 PM

## 2021-08-22 NOTE — IPOC Note (Signed)
Overall Plan of Care [facility]) Patient Details Name: Justin Mann MRN: [id number] DOB: [birth year]  Admitting Diagnosis: Stroke (cerebrum) [facility])  Hospital Problems: Principal Problem:   Stroke (cerebrum) ([place]) Active Problems:   Stage 3 chronic kidney disease ([place])   Essential hypertension     Functional Problem List: Nursing Bladder, Bowel, Edema, Endurance, Pain, Medication Management, Safety  PT Balance, Perception, Behavior, Safety, Edema, Sensory, Endurance, Skin Integrity, Motor, Nutrition, Pain  OT Balance, Pain, Motor, Perception, Endurance, Sensory, Cognition, Safety  SLP    TR         Basic ADLs: OT Grooming, Bathing, Dressing, Toileting, Eating     Advanced  ADLs: OT Simple Meal Preparation     Transfers: PT Bed Mobility, Bed to Chair, Car, Manufacturing systems engineer, Metallurgist: PT Ambulation, Emergency planning/management officer, Stairs     Additional Impairments: OT Fuctional Use of Upper Extremity  SLP        TR      Anticipated Outcomes Item Anticipated Outcome  Self Feeding independent  Swallowing      Basic self-care  supervision  Toileting  supervision   Bathroom Transfers supervision  Bowel/Bladder  manage bowel and bladder w mod I assist  Transfers  supervision using LRAD  Locomotion  supervision using LRAD  Communication     Cognition     Pain  Pain managed with prn meds at or below level 4  Safety/Judgment  maintain safety w cues   Therapy Plan: PT Intensity: Minimum of 1-2 x/day ,45 to 90 minutes PT Frequency: 5 out of 7 days PT Duration Estimated Length of Stay: ~1.5 - 2 weeks OT Intensity: Minimum of 1-2 x/day, 45 to 90 minutes OT Frequency: 5 out of 7 days OT Duration/Estimated Length of Stay: 12-14 days     Due to the current state of emergency, patients may not be receiving their 3-hours of Medicare-mandated therapy.   Team Interventions: Nursing Interventions Bladder Management, Disease Management/Prevention,  Medication Management, Discharge Planning, Pain Management, Bowel Management, Patient/Family Education  PT interventions Ambulation/gait training, Community reintegration, DME/adaptive equipment instruction, Neuromuscular re-education, Psychosocial support, Stair training, UE/LE Strength taining/ROM, Wheelchair propulsion/positioning, Training and development officer, Discharge planning, Functional electrical stimulation, Pain management, Skin care/wound management, Therapeutic Activities, UE/LE Coordination activities, Cognitive remediation/compensation, Disease management/prevention, Functional mobility training, Patient/family education, Splinting/orthotics, Therapeutic Exercise, Visual/perceptual remediation/compensation  OT Interventions Training and development officer, Cognitive remediation/compensation, Community reintegration, Engineer, drilling, Discharge planning, Self Care/advanced ADL retraining, Therapeutic Activities, UE/LE Coordination activities, Functional mobility training, Patient/family education, Disease mangement/prevention, Therapeutic Exercise, Neuromuscular re-education, UE/LE Strength taining/ROM  SLP Interventions    TR Interventions    SW/CM Interventions Discharge Planning, Psychosocial Support, Patient/Family Education, Disease Management/Prevention   Barriers to Discharge MD  Medical stability and Incontinence  Nursing Decreased caregiver support, Home environment access/layout 1 level 4 ste bil rails; 2 step up to kitchen and 2 step down to [employer] w spouse  PT Home environment Child psychotherapist    OT      SLP      SW Neurogenic Bowel & Bladder, Lack of/limited family support     Team Discharge Planning: Destination: PT-Home ,OT- Home , SLP-  Projected Follow-up: PT-Home health PT, 24 hour supervision/assistance, OT-  Home health OT, 24 hour supervision/assistance, SLP-None Projected Equipment Needs: PT-To be determined, Other (comment), OT- To be determined,  SLP-None recommended by SLP Equipment Details: PT-has rollator, OT-  Patient/family involved in discharge planning: PT- Patient, Family member/caregiver,  OT-Patient, SLP-Patient  MD ELOS: 10-12d Medical Rehab  Prognosis:  Good Assessment: 76 year old male with history of HTN, PD ([facility]), CKD, rectal cancer s/p APR who was admitted on 08/16/20 with right sided weakness and difficulty walking. UDS  negative. 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  Right shoulder X rays were negative for fracture. 2D echo showed EF 65-70% with no wall abnormality and mild MVR. He has a history of falling.    See Team Conference Notes for weekly updates to the plan of care

## 2021-08-23 NOTE — Progress Notes (Addendum)
[place] PHYSICAL MEDICINE & REHABILITATION PROGRESS NOTE  Subjective/Complaints:  Had a sudden onset of syncope, no shaking , no prodrome, denied CP at that time   ROS: Denies CP, SOB, N/V/D  Objective: Vital Signs: Blood pressure (!) 155/92, pulse 61, temperature 98.4 F (36.9 C), temperature source Oral, resp. rate 14, height 5\' 10"  (1.778 m), weight 88.3 kg, SpO2 96 %. No results found. Recent Labs    08/22/21 0742  WBC 7.1  HGB 10.6*  HCT 31.3*  PLT 203    Recent Labs    08/22/21 0742  NA 140  K 3.8  CL 106  CO2 26  GLUCOSE 155*  BUN 34*  CREATININE 2.04*  CALCIUM 8.6*     Intake/Output Summary (Last 24 hours) at 08/23/2021 0758 Last data filed at 08/23/2021 0700 Gross per 24 hour  Intake 860 ml  Output 250 ml  Net 610 ml         Physical Exam: BP (!) 155/92 (BP Location: Right Arm)    Pulse 61    Temp 98.4 F (36.9 C) (Oral)    Resp 14    Ht 5\' 10"  (1.778 m)    Wt 88.3 kg    SpO2 96%    BMI 27.93 kg/m   General: No acute distress Mood and affect are appropriate Heart: Regular rate and rhythm no rubs murmurs or extra sounds Lungs: Clear to auscultation, breathing unlabored, no rales or wheezes Abdomen: Positive bowel sounds, soft nontender to palpation, nondistended, LLQ colostomy Extremities: No clubbing, cyanosis, or edema Skin: No evidence of breakdown, no evidence of rash   Psych: Flat. Musc: No edema in extremities.  No tenderness in extremities. Neuro: Alert Motor: LUE/LLE: 5/5 proximal distally on RUE: 5/5 proximal distal RLE: 4/5 proximal distal Mild ataxia R FNF  Assessment/Plan: 1. Functional deficits which require 3+ hours per day of interdisciplinary therapy in a comprehensive inpatient rehab setting. Physiatrist is providing close team supervision and 24 hour management of active medical problems listed below. Physiatrist and rehab team continue to assess barriers to discharge/monitor patient progress toward functional and  medical goals   Care Tool:  Bathing    Body parts bathed by patient: Right arm, Left upper leg         Bathing assist Assist Level: Minimal Assistance - Patient > 75%     Upper Body Dressing/Undressing Upper body dressing   What is the patient wearing?: Pull over shirt    Upper body assist Assist Level: Set up assist    Lower Body Dressing/Undressing Lower body dressing      What is the patient wearing?: Pants     Lower body assist Assist for lower body dressing: Minimal Assistance - Patient > 75%     Toileting Toileting    Toileting assist Assist for toileting: Contact Guard/Touching assist Assistive Device Comment: urinal   Transfers Chair/bed transfer  Transfers assist     Chair/bed transfer assist level: Minimal Assistance - Patient > 75% Chair/bed transfer assistive device: Programmer, multimedia   Ambulation assist   Ambulation activity did not occur:  (requires use of RW for safety when pt only intermittently used rollator at home)  Assist level: Minimal Assistance - Patient > 75% Assistive device: Hand held assist Max distance: 67ft   Walk 10 feet activity   Assist     Assist level: Moderate Assistance - Patient - 50 - 74% Assistive device: Walker-rolling   Walk 50 feet activity   Assist  Assist level: Moderate Assistance - Patient - 50 - 74% Assistive device: Walker-rolling    Walk 150 feet activity   Assist Walk 150 feet activity did not occur: Safety/medical concerns         Walk 10 feet on uneven surface  activity   Assist Walk 10 feet on uneven surfaces activity did not occur: Safety/medical concerns         Wheelchair     Assist Is the patient using a wheelchair?: Yes (used for transportation to/from therapy gym) Type of Wheelchair: Manual    Wheelchair assist level: Dependent - Patient 0%      Wheelchair 50 feet with 2 turns activity    Assist        Assist Level: Dependent -  Patient 0%   Wheelchair 150 feet activity     Assist      Assist Level: Dependent - Patient 0%    Medical Problem List and Plan: 1. 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  Antithrombotics: -DVT/anticoagulation:  Pharmaceutical: Lovenox             -antiplatelet therapy: DAPT X 3 weeks followed by Plavix 3. Pain Management: tylenol prn 4. Mood: Lcsw to follow for evaluation and support             -antipsychotic agents: N/A 5. Neuropsych: This patient is  capable of making decisions on his own behalf. 6. Skin/Wound Care: Routine pressure relief measures. 7. Fluids/Electrolytes/Nutrition: Monitor I/Os.  8. HTN: Permissive HTN with slow titration of BP meds, continue amlodipine   Vitals:   08/22/21 1921 08/23/21 0516  BP: (!) 168/87 (!) 155/92  Pulse: 80 61  Resp: 16 14  Temp: 98.3 F (36.8 C) 98.4 F (36.9 C)  SpO2: 97% 96%  Mild elevation will likely need titration this week   9. CKD: Avoid nephrotoxic meds and hypotensive episodes             --baseline SCr-1.95 to 2.08.    BMP Latest Ref Rng & Units 08/22/2021 08/20/2021 08/19/2021  Glucose 70 - 99 mg/dL 155(H) 126(H) 125(H)  BUN 8 - 23 mg/dL 34(H) 38(H) 42(H)  Creatinine 0.61 - 1.24 mg/dL 2.04(H) 2.12(H) 1.97(H)  Sodium 135 - 145 mmol/L 140 139 140  Potassium 3.5 - 5.1 mmol/L 3.8 3.8 3.8  Chloride 98 - 111 mmol/L 106 107 107  CO2 22 - 32 mmol/L 26 25 25   Calcium 8.9 - 10.3 mg/dL 8.6(L) 9.0 8.7(L)   Cont to monitor 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 Nocturia: Gets up every 2 hours at nights. Has seen urology in the past. Continue flomax 0.8mg  HS.              --will use condom cath at nights to help with sleep.  12. Parkinson's Disease: continue Sinemet 25-100mg  2 tablets 4 times per day.   13. Insomnia: continue trazodone prn HS  14.  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  Hx rectal Ca LLQ colostomy- cannot use abd binder  LOS: 4 days A FACE TO FACE EVALUATION WAS PERFORMED  Charlett Blake 08/23/2021, 7:58 AM

## 2021-08-23 NOTE — Progress Notes (Signed)
Occupational Therapy Session Note  Patient Details  Name: Justin Mann MRN: [id number] Date of Birth: [birth year]  Today's Date: 08/23/2021 OT Individual Time: 8127-5170 OT Individual Time Calculation (min): 57 min    Short Term Goals: Week 1:  OT Short Term Goal 1 (Week 1): Pt will complete LB bathing at min guard assist sit to stand shower level. OT Short Term Goal 2 (Week 1): Pt will complete LB dressing with min guard assist sit to stand. OT Short Term Goal 3 (Week 1): Pt will perform shower transfers with use of the RW and min guard to the walk-in shower in his room. OT Short Term Goal 4 (Week 1): Pt will perform toilet transfer and toileting with min guard sit to stand from the 3:1 using the RW for support.  Skilled Therapeutic Interventions/Progress Updates:  Pt greeted seated in recliner agreeable to OT intervention. Pt received with thigh high TEDs donned, VS assessed during session, pt OH but with no s/s during mobility: BP sitting 159/88 ( 108) BP standing 128/83 ( 97)  Session focus on BADL reeducation, functional mobility, dynamic standing balance and decreasing overall caregiver burden. Pt reports need to void bladder, MIN A for sit>stand from recliner with RW, ambulatory toilet transfer with RW and MINA, pt with no c/o dizziness. CGA For 3/3 toileting tasks, pt ambulated to sink with Rw and MIN A to completed standing grooming tasks. Pt transported to ADL apt with total A for time mgmt.  Pt completed ambulatory transfers with Rw to bed, couch and walk in shower with overall MIN A. Pt completed functional mobility in kitchen with rw and MIN A, pt able to reach into cabinets and refrigerator with overall MINA. Education provided on energy conservation strategies for kitchen tasks with verbalizing understanding. Pt transported back to room with total A where pt left seated in recliner with all needs within reach.  Therapy Documentation Precautions:  Precautions Precautions: Fall,  Other (comment) Precaution Comments: increased falls in recent hx; colostomy bag, R hemi (LE>UE) Restrictions Weight Bearing Restrictions: No  Pain: no pain reported during session     Therapy/Group: Individual Therapy  [name] 08/23/2021, 3:45 PM

## 2021-08-23 NOTE — Progress Notes (Signed)
Occupational Therapy Session Note  Patient Details  Name: Justin Mann MRN: [id number] Date of Birth: 1945-08-24  Today's Date: 08/23/2021 OT Individual Time: 1135-1200 OT Individual Time Calculation (min): 25 min    Short Term Goals: Week 1:  OT Short Term Goal 1 (Week 1): Pt will complete LB bathing at min guard assist sit to stand shower level. OT Short Term Goal 2 (Week 1): Pt will complete LB dressing with min guard assist sit to stand. OT Short Term Goal 3 (Week 1): Pt will perform shower transfers with use of the RW and min guard to the walk-in shower in his room. OT Short Term Goal 4 (Week 1): Pt will perform toilet transfer and toileting with min guard sit to stand from the 3:1 using the RW for support.  Skilled Therapeutic Interventions/Progress Updates:    Pt received sitting in the w/c with no c/o pain, reporting need to urinate. He completed a stand pivot transfer to the [facility] over the toilet with min A using grab bars. He voided urine while OT donned thigh high teds with total A. He stood and completed toileting tasks with min A overall. Transfer back to w/c with min A. Orthostatic vitals obtained- pt 159/82 seated and 116/69 standing. He was able to remain standing for several minutes with CGA. Cueing provided for upright posture. Discussion parkinsonisms, including autonomic dysfunction. He was left sitting up in the recliner with all needs met, wife present.   Therapy Documentation Precautions:  Precautions Precautions: Fall, Other (comment) Precaution Comments: increased falls in recent hx; colostomy bag, R hemi (LE>UE) Restrictions Weight Bearing Restrictions: No Therapy/Group: Individual Therapy  Curtis Sites 08/23/2021, 6:20 AM

## 2021-08-23 NOTE — Progress Notes (Signed)
Physical Therapy Session Note  Patient Details  Name: Justin Mann MRN: [id number] Date of Birth: 1945-11-30  Today's Date: 08/23/2021 PT Individual Time: 0907-1007 and 8144-8185 PT Individual Time Calculation (min): 60 min and 42 min   and  Today's Date: 08/23/2021 PT Missed Time: 18 Minutes Missed Time Reason: Patient fatigue  Short Term Goals: Week 1:  PT Short Term Goal 1 (Week 1): Pt will perform supine<>sit with CGA PT Short Term Goal 2 (Week 1): Pt will perform sit<>stands using LRAD with min assist consistently PT Short Term Goal 3 (Week 1): Pt will perform bed<>chair transfers using LRAD with min assist consistently PT Short Term Goal 4 (Week 1): Pt will ambulate at least 78ft using LRAD with min assist PT Short Term Goal 5 (Week 1): Pt will navigate up/down a step using LRAD with min assist to simulate step inside home  Skilled Therapeutic Interventions/Progress Updates:    Session 1: Pt received supine in bed and agreeable to therapy session. Pt reports he is unable to clearly recall the events of yesterday's incident. Supine>sitting L EOB, HOB partially elevated and bedrails available but not used, with supervision. No significant posterior trunk lean/LOB sitting EOB noticed this AM. Pt requests to change briefs and clean-up. Sit>stand EOB>RW with CGA/light min assist for steadying. Pt able to manage LB clothing with CGA/min assist for balance. Therapist removed pt's sacral foam dressing and noticed significant amount of adhesive stuck to his skin - nurse notified and assisted therapist in [place]. Pt performing multiple sit<>stands EOB<>RW during this with light min assist for balance - pt starts to demonstrate progressively increased R trunk lean with fatigue (CGA/min assist for balance) - tolerates standing ~58minutes each bout during peri-care with CGA. Seated EOB, donned clean brief and pants set-up assist - pt unable to recall OT education on donning R LE first. Short  distance ~63ft ambulatory transfer EOB>w/c using RW with CGA - cuing to turn fully, step back to seat, and keep AD close prior to initiating sitting.  Transported to/from gym in w/c for time management and energy conservation.  Gait training ~52ft using RW with CGA for steadying - pt demos decreased gait speed, decreased B LE step lengths (slight shuffle pattern) with decreased R LE foot clearance (worsens with fatigue) and excessive thoracic kyphosis with downward gaze. At end of gait: HR 94bpm decreasing to 78bpm in <20 seconds.  Donned 4lb ankle weight on R LE. Initiated gait training ~43ft using RW with CGA/light min assist when pt suddenly stops walking and reports feeling "whoozy...lightheaded" Therapist provided chair for pt to sit. Assessed vitals: BP 153/85 (MAP 105), HR 66bpm, SpO2 98% and provided pt with water. Vitals in standing: BP 100/80 (MAP 87), HR 77bpm, SpO2 98% - pt reports again feeling "lightheaded" = positive for orthostatic hypotension based on drop in systolic BP and symptoms.  Provided pt with thigh high TEDs and note left in pt's room for OT to don them in next session. Pt transported back to room and agreeable to remain sitting up in w/c - left with needs in reach and chair alarm on. Nurse made aware of pt's orthostatic hypotension.    Session 2: Pt received sitting in recliner wearing B LE thigh high TED hose and agreeable to therapy session despite fatigue. Sit>stand recliner>RW with CGA. Short distance ~60ft ambulatory transfer to w/c using RW with CGA - continues to have excessive thoracic kyphosis with shuffled gait. Transported to/from gym in w/c for time management and energy  conservation.   Stair navigation training ascending/descending 8 steps (6" height) using B HRs with step-to pattern leading with R LE on both ascent and descent progressed to reciprocal pattern for R LE NMR after therapist cuing - light min assist for balance due to minor posterior lean during  descent - has excessive B LE hip external rotation on descent.   Therapist provided visual demonstration and education on sequencing one step navigation using RW to prepare for step-up in pt's home - pt stepped up/down 1 step (5"  height) x2 using RW with CGA for steadying/safety, demos understanding of technique and ability to manage AD without assist.  Donned 4lb ankle weight on R LE. Gait training ~69ft x2 using RW with CGA/light min assist for safety - continues to demo decreased R LE hip flexor and hamstring activation during swing resulting in poor foot clearance and decreased step length, cuing and tactile facilitation for improvement - overall with decreased gait speed, decreased L LE step length, and excessive rounded thoracic spine posturing with downward gaze.  Dynamic standing balance and R LE NMR via forward/backwards stepping over hockey stick wearing 4lb ankle weight - planning to attempt without UE support but pt reports fear of R knee buckle therefore used RW support - cuing for increased hamstring activation to lift R LE and clear foot over stick while stepping in both directions - CGA/light min assist for balance.  Pt continues to demonstrate significant posterior pelvic tilt in sitting resulting in posterior lean - therapist utilized a sheet and manual facilitation to stretch towards anterior pelvic tilt and increased thoracic extension to achieve upright posture. Pt reports he has had poor posture his whole life and stretching causes 5/10 pain therefore therapist discontinued it.  Seated R LE long arc quad wearing 4lb ankle weight x20reps with pt demoing significant quad weakness with difficulty achieving terminal knee extension. Pt reports worsening fatigue and requests to return to room to rest. Stand pivot to w/c using RW with CGA/light min assist - throughout session noticed pt has difficulty stepping R LE laterally to maintain wide BOS during transfers towards R.   Transported  back to room. Stand pivot w/c>recliner using UE support on armrests with CGA. Pt left seated in recliner with needs in reach and chair alarm on. Missed 18 minutes of skilled physical therapy.  Therapy Documentation Precautions:  Precautions Precautions: Fall, Other (comment) Precaution Comments: increased falls in recent hx; colostomy bag, R hemi (LE>UE) Restrictions Weight Bearing Restrictions: No   Pain: Session 1: No reports of pain throughout session.  Session 2: Reports 5/10 low back pain when attempting to perform anterior pelvic tilt stretching in sitting - discontinued stretch and provided support sitting rest break.   Therapy/Group: Individual Therapy  Tawana Scale , PT, DPT, NCS, CSRS  08/23/2021, 7:47 AM

## 2021-08-24 NOTE — Progress Notes (Addendum)
RN rounding complete. Pt is sleeping. Pt able to turn self. Urinal emptied 500 urine output. PT denies any needs at this time.  ? ?29 ?RN rounding complete BP 171/89. Pt denies any distress. PT refuses orthostatic vitals stating he wanting to continue sleeping. RN will attempt again around 0600 if unsuccessful will pass on to day shift nurse.  ?

## 2021-08-24 NOTE — Progress Notes (Signed)
Physical Therapy Session Note ? ?Patient Details  ?Name: Justin Mann ?MRN: [id number] ?Date of Birth: Feb 18, 1946 ? ?Today's Date: 08/24/2021 ?PT Individual Time: 6314-9702 ?PT Individual Time Calculation (min): 55 min  ? ?Short Term Goals: ?Week 1:  PT Short Term Goal 1 (Week 1): Pt will perform supine<>sit with CGA ?PT Short Term Goal 2 (Week 1): Pt will perform sit<>stands using LRAD with min assist consistently ?PT Short Term Goal 3 (Week 1): Pt will perform bed<>chair transfers using LRAD with min assist consistently ?PT Short Term Goal 4 (Week 1): Pt will ambulate at least 68ft using LRAD with min assist ?PT Short Term Goal 5 (Week 1): Pt will navigate up/down a step using LRAD with min assist to simulate step inside home ? ?Skilled Therapeutic Interventions/Progress Updates:  ?  Pt received sitting in recliner and agreeable to therapy session. Pt already wearing B LE thigh high TED hose. Reports needing to void bladder. Sit>stand recliner>RW with CGA. Gait into bathroom using RW with CGA for steadying - continues to demo decreased gait speed with short, shuffled steps and especially decreased R LE foot clearance and step length as well as forward flexed posture. Standing with CGA for steadying performed LB clothing management without assist. Pt unable void despite attempt. ? ? Transported to/from gym in w/c for time management and energy conservation. ? ?Vitals: BP 148/84 (MAP 106), HR 77bpm, SpO2 99%  ? ?Stepped on/off treadmill using B UE support on handrails with CGA/light min assist for balance. Standing with B UE support and supervision donned litegait harness with careful consideration of placement to avoid colostomy bag.  ? ?Gait training on treadmill while in litegait harness for safety but not providing support as follows:  ?- 85min4sec at 0.17mph totaling 51ft: thearpist providing visual and verbal cuing for increase R LE foot clearance and step length with improved ability to consistently achieve this on  treadmill compared to overground gait training performed thus far  ?Reassessed vitals: BP 167/86 (MAP 104), HR 81bpm, SpO2 99% ?- 55sec at 0.24mph totaling 66ft: continued to provide above cuing ? ?Pt reporting significant fatigue and declining further gait training on treadmill. Doffed harness and stepped backwards off treadmill as described above. Therapist ensured colostomy intact with no adverse effects from harness. ? ?Gait training ~98ft overground using RW with CGA - pt demos significant improvement in R LE foot clearance and step length as well as overall increased gait speed and longer stride lengths. ? ?Dynamic gait training via side stepping ~8ft in each direction using B UE support on hallway rail with light min assist for balance - continues to have increased difficulty stepping towards R with shorter step length; cuing and facilitation for improvement. ? ?Transported back towards room. Gait training ~30ft into room using RW with CGA - pt continues to demo good carryover of improved R gait mechanics, but less so when needing to take smaller steps to navigate around furniture in room. ? ?At end of session, pt left seated in recliner with needs in reach and chair alarm on. ? ? 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 ? ?Pain: ? Reports R shoulder pain stating it "hurts all the time" - therapist provided rest breaks, distraction, and repositioning for pain management. ? ? ? ?Therapy/Group: Individual Therapy ? ?Tawana Scale , PT, DPT, NCS, CSRS ?08/24/2021, 12:28 PM  ?

## 2021-08-24 NOTE — Progress Notes (Signed)
Patient ID: Justin Mann, male   DOB: [birth year], 76 y.o.   MRN: [id number] ? ?Team Conference Report to Patient/Family ? ?Team Conference discussion was reviewed with the patient and caregiver, including goals, any changes in plan of care and target discharge date.  Patient and caregiver express understanding and are in agreement.  The patient has a target discharge date of 08/30/21. ? ?SW met with patient and spouse and provided team conference updates. Patient has been refusing some medications. BP has improved. The team plans to monitor pt BP levels. Therapy recommending RW and BSC pt and spouse have declined BSC. Sw will arrange OP at [facility].  ?[name] ?08/24/2021, 1:54 PM  ?

## 2021-08-24 NOTE — Progress Notes (Signed)
[place] PHYSICAL MEDICINE & REHABILITATION PROGRESS NOTE ? ?Subjective/Complaints: ? ?No issues overnite , discussed parkinson's tremor and BP  ? ?ROS: Denies CP, SOB, N/V/D ? ?Objective: ?Vital Signs: ?Blood pressure (!) 171/89, pulse 60, temperature 98.2 ?F (36.8 ?C), resp. rate 18, height 5\' 10"  (1.778 m), weight 88.3 kg, SpO2 98 %. ?No results found. ?Recent Labs  ?  08/22/21 ?0742  ?WBC 7.1  ?HGB 10.6*  ?HCT 31.3*  ?PLT 203  ? ? ?Recent Labs  ?  08/22/21 ?0742  ?NA 140  ?K 3.8  ?CL 106  ?CO2 26  ?GLUCOSE 155*  ?BUN 34*  ?CREATININE 2.04*  ?CALCIUM 8.6*  ? ? ? ?Intake/Output Summary (Last 24 hours) at 08/24/2021 0843 ?Last data filed at 08/24/2021 0700 ?Gross per 24 hour  ?Intake 860 ml  ?Output 275 ml  ?Net 585 ml  ? ?  ? ?  ? ?Physical Exam: ?BP (!) 171/89   Pulse 60   Temp 98.2 ?F (36.8 ?C)   Resp 18   Ht 5\' 10"  (1.778 m)   Wt 88.3 kg   SpO2 98%   BMI 27.93 kg/m?  ? ?General: No acute distress ?Mood and affect are appropriate ?Heart: Regular rate and rhythm no rubs murmurs or extra sounds ?Lungs: Clear to auscultation, breathing unlabored, no rales or wheezes ?Abdomen: Positive bowel sounds, soft nontender to palpation, nondistended, LLQ colostomy ?Extremities: No clubbing, cyanosis, or edema ?Skin: No evidence of breakdown, no evidence of rash ? ? ?Psych: Flat. ?Musc: No edema in extremities.  No tenderness in extremities. ?Neuro: Alert ?Motor: LUE/LLE: 5/5 proximal distally on RUE: 5/5 proximal distal ?RLE: 4/5 proximal distal ?Mild ataxia R FNF ? ?Assessment/Plan: ?1. Functional deficits which require 3+ hours per day of interdisciplinary therapy in a comprehensive inpatient rehab setting. ?Physiatrist is providing close team supervision and 24 hour management of active medical problems listed below. ?Physiatrist and rehab team continue to assess barriers to discharge/monitor patient progress toward functional and medical goals ? ? ?Care Tool: ? ?Bathing ?   ?Body parts bathed by patient: Right arm,  Left upper leg  ?   ?  ?  ?Bathing assist Assist Level: Minimal Assistance - Patient > 75% ?  ?  ?Upper Body Dressing/Undressing ?Upper body dressing   ?What is the patient wearing?: Pull over shirt ?   ?Upper body assist Assist Level: Set up assist ?   ?Lower Body Dressing/Undressing ?Lower body dressing ? ? ?   ?What is the patient wearing?: Pants ? ?  ? ?Lower body assist Assist for lower body dressing: Minimal Assistance - Patient > 75% ?   ? ?Toileting ?Toileting    ?Toileting assist Assist for toileting: Contact Guard/Touching assist ?Assistive Device Comment: urinal ?  ?Transfers ?Chair/bed transfer ? ?Transfers assist ?   ? ?Chair/bed transfer assist level: Minimal Assistance - Patient > 75% ?Chair/bed transfer assistive device: Armrests, Walker ?  ?Locomotion ?Ambulation ? ? ?Ambulation assist ? ? Ambulation activity did not occur:  (requires use of RW for safety when pt only intermittently used rollator at home) ? ?Assist level: Contact Guard/Touching assist ?Assistive device: Walker-rolling ?Max distance: 20ft  ? ?Walk 10 feet activity ? ? ?Assist ?   ? ?Assist level: Moderate Assistance - Patient - 50 - 74% ?Assistive device: Walker-rolling  ? ?Walk 50 feet activity ? ? ?Assist   ? ?Assist level: Moderate Assistance - Patient - 50 - 74% ?Assistive device: Walker-rolling  ? ? ?Walk 150 feet activity ? ? ?Assist Walk  150 feet activity did not occur: Safety/medical concerns ? ?  ?  ?  ? ?Walk 10 feet on uneven surface  ?activity ? ? ?Assist Walk 10 feet on uneven surfaces activity did not occur: Safety/medical concerns ? ? ?  ?   ? ?Wheelchair ? ? ? ? ?Assist Is the patient using a wheelchair?: Yes (used for transportation to/from therapy gym) ?Type of Wheelchair: Manual ?  ? ?Wheelchair assist level: Dependent - Patient 0% ?   ? ? ?Wheelchair 50 feet with 2 turns activity ? ? ? ?Assist ? ?  ?  ? ? ?Assist Level: Dependent - Patient 0%  ? ?Wheelchair 150 feet activity  ? ? ? ?Assist ?   ? ? ?Assist Level:  Dependent - Patient 0%  ? ? ?Medical Problem List and Plan: ?1. 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  Antithrombotics: ?-DVT/anticoagulation:  Pharmaceutical: Lovenox ?            -antiplatelet therapy: DAPT X 3 weeks followed by Plavix ?3. Pain Management: tylenol prn ?4. Mood: Lcsw to follow for evaluation and support ?            -antipsychotic agents: N/A ?5. Neuropsych: This patient is  capable of making decisions on his own behalf. ?6. Skin/Wound Care: Routine pressure relief measures. ?7. Fluids/Electrolytes/Nutrition: Monitor I/Os.  ?8. HTN: Permissive HTN with slow titration of BP meds, continue amlodipine ?  ?Vitals:  ? 08/23/21 1945 08/24/21 0413  ?BP: (!) 184/88 (!) 171/89  ?Pulse: 62 60  ?Resp: 14 18  ?Temp: 97.7 ?F (36.5 ?C) 98.2 ?F (36.8 ?C)  ?SpO2: 95% 98%  ?Increase amlodipine to 7.5 mg ? ?9. CKD: Avoid nephrotoxic meds and hypotensive episodes ?            --baseline SCr-1.95 to 2.08.  ?  ?BMP Latest Ref Rng & Units 08/22/2021 08/20/2021 08/19/2021  ?Glucose 70 - 99 mg/dL 155(H) 126(H) 125(H)  ?BUN 8 - 23 mg/dL 34(H) 38(H) 42(H)  ?Creatinine 0.61 - 1.24 mg/dL 2.04(H) 2.12(H) 1.97(H)  ?Sodium 135 - 145 mmol/L 140 139 140  ?Potassium 3.5 - 5.1 mmol/L 3.8 3.8 3.8  ?Chloride 98 - 111 mmol/L 106 107 107  ?CO2 22 - 32 mmol/L 26 25 25   ?Calcium 8.9 - 10.3 mg/dL 8.6(L) 9.0 8.7(L)  ? 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 Nocturia: Gets up every 2 hours at nights. Has seen urology in the past. Continue flomax 0.8mg  HS.  ?            --will use condom cath at nights to help with sleep.  ?12. Parkinson's Disease: continue Sinemet 25-100mg  2 tablets 4 times per day.   ?13. Insomnia: continue trazodone prn HS ? 14.  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 ?Justin Mann ?08/24/2021, 8:43 AM ? ?

## 2021-08-24 NOTE — Patient Care Conference (Signed)
Inpatient RehabilitationTeam Conference and Plan of Care Update ?Date: 08/24/2021   Time: 10:47 AM  ? ? ?Patient Name: Justin Mann      ?Medical Record Number: [id number]  ?Date of Birth: 1945/09/21 ?Sex: Male         ?Room/Bed: 4M11C/4M11C-01 ?Payor Info: Payor: [employer] / Plan: [place] / Product Type: *No Product type* /   ? ?Admit Date/Time:  08/19/2021  2:24 PM ? ?Primary Diagnosis:  Stroke (cerebrum) ([place]) ? ?Hospital Problems: Principal Problem: ?  Stroke (cerebrum) ([place]) ?Active Problems: ?  Stage 3 chronic kidney disease ([place]) ?  Essential hypertension ? ? ? ?Expected Discharge Date: Expected Discharge Date: 08/30/21 ? ?Team Members Present: ?Physician leading conference: Dr. Alysia Penna ?Social Worker Present: Erlene Quan, BSW ?Nurse Present: Dorien Chihuahua, RN ?PT Present: Page Spiro, PT ?OT Present: [name], COTA ?SLP Present: Sherren Kerns, SLP ?PPS Coordinator present : Gunnar Fusi, SLP ? ?   Current Status/Progress Goal Weekly Team Focus  ?Bowel/Bladder ? ? Cont of B/B using urinal and ostomy  Pt will contiune to care for ostomy  Pt will contiuet to manage Bowel and bladder Assess for change in condition   ?Swallow/Nutrition/ Hydration ? ?           ?ADL's ? ? MIN A bathing, set-up UB ADLS, MIN A LB ADLS, toileting CGA, transfers with RW and MINA  supervision goals  BADL reeducation, dynamic balance, transfers, activity tolerance   ?Mobility ? ? CGA bed mobility, in AM can require intermittent mod assist for seated trunk control due to posterior lean/LOB, min assist sit<>stand and stand pivot transfers using RW, min assist gait up to 65ft using RW, min assist 8 steps using B HRs - orthostatic hypotension - significantly impaired endurance/activity tolerance  supervision overall at ambulatory level  activity tolerance, pt education, transfer training, gait training using LRAD, stair navigation training, dynamic standing balance, R LE NMR    ?Communication ? ?           ?Safety/Cognition/ Behavioral Observations ?           ?Pain ? ? PT reports mild pain in back and LE  Assess Q shift  Assess Q shift and manage pain with ordered medications.   ?Skin ? ? Skin intact Ostomy site intact clean and dry  maintain skin integrity  assess skin for breakdown and maintain intact skin,   ? ? ?Discharge Planning:  ?d/c home wit spouse   ?Team Discussion: ?Patient with parkinson's and orthostasis. MD adjusted medications and added thigh high TEDs. Patient with fatigue and poor endurance post stroke.  ?Patient on target to meet rehab goals: ?yes, currently needs set up for upper body care and min assist for lower body care. Needs CGA for toileting. Requires CGA for transfers due to posterior loss of balance. Able to ambulate up to 35' with min assist and manage 8 steps. Goals for discharge set for supervision overall. ? ?*See Care Plan and progress notes for long and short-term goals.  ? ?Revisions to Treatment Plan:  ?N/A ?  ?Teaching Needs: ?Safety, transfers, toileting, medications, secondary risk management, etc  ?Current Barriers to Discharge: ?Decreased caregiver support ? ?Possible Resolutions to Barriers: ?Family education with wife ?OP follow up services ?DME: shower seat and rolling walker ?  ? ? Medical Summary ?Current Status: shoulder pain refusing meds, has colostomy , orthostatic hypotension, but BP generally up ? Barriers to Discharge: Medical stability;Other (comments) ? Barriers to Discharge Comments:  parkinson's disease ?Possible Resolutions to [employer]: see above, post lean balanve related PD ? ? ?Continued Need for Acute Rehabilitation Level of Care: The patient requires daily medical management by a physician with specialized training in physical medicine and rehabilitation for the following reasons: ?Direction of a multidisciplinary physical rehabilitation program to maximize functional independence : Yes ?Medical management of  patient stability for increased activity during participation in an intensive rehabilitation regime.: Yes ?Analysis of laboratory values and/or radiology reports with any subsequent need for medication adjustment and/or medical intervention. : Yes ? ? ?I attest that I was present, lead the team conference, and concur with the assessment and plan of the team. ? ? ?Dorien Chihuahua B ?08/24/2021, 5:33 PM  ? ? ? ? ? ? ?

## 2021-08-24 NOTE — Progress Notes (Signed)
Physical Therapy Session Note ? ?Patient Details  ?Name: Justin Mann ?MRN: [id number] ?Date of Birth: Mar 31, 1946 ? ?Today's Date: 08/24/2021 ?PT Individual Time: 7395-8441 ?PT Individual Time Calculation (min): 25 min  ? ?Short Term Goals: ?Week 1:  PT Short Term Goal 1 (Week 1): Pt will perform supine<>sit with CGA ?PT Short Term Goal 2 (Week 1): Pt will perform sit<>stands using LRAD with min assist consistently ?PT Short Term Goal 3 (Week 1): Pt will perform bed<>chair transfers using LRAD with min assist consistently ?PT Short Term Goal 4 (Week 1): Pt will ambulate at least 82f using LRAD with min assist ?PT Short Term Goal 5 (Week 1): Pt will navigate up/down a step using LRAD with min assist to simulate step inside home ? ?Skilled Therapeutic Interventions/Progress Updates: Pt presented in bed agreeable to therapy. Pt states some pain in shoulder but premedicated. PTA donned TED hose total A for time management.  Pt performed bed mobility with CGA and use of bed features. Pt was able to don pants with supervision, and performed Sit to stand with CGA with minA to pull pants over hips. Pt returned to sitting and after brief rest participated in gait training in hallway. Pt ambulated ~1298fwith RW and CGA. Pt required cues for increasing R step length for for improved clearance of RLE. At end of hallway near nsg station increased distractions with PTA noted decreased quality of ambulation. After seated rest pt indicated increased difficulty focusing on ambulation due to distractions. Pt ambulated back to room with decreased distractions and continued cues for step length and clearance. Pt ambulated to recliner once in room and left with seat alarm on, call bell within reach and needs met.  ?   ? 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  ?Vital Signs: ?Therapy Vitals ?Temp: 98.9 ?F (37.2 ?C) ?Temp Source: Oral ?Pulse Rate: 71 ?Resp: 16 ?BP: (!) 158/80 ?Patient Position (if appropriate): Sitting ?Oxygen Therapy ?SpO2: 100 % ?O2 Device: Room Air ?Pain: ?  ?Mobility: ?  ?Locomotion : ?   ?Trunk/Postural Assessment : ?   ?Balance: ?  ?Exercises: ?  ?Other Treatments:   ? ? ? ?Therapy/Group: Individual Therapy ? ?Donya Hitch ?08/24/2021, 4:15 PM  ?

## 2021-08-24 NOTE — Progress Notes (Signed)
Occupational Therapy Session Note ? ?Patient Details  ?Name: Justin Mann ?MRN: [id number] ?Date of Birth: 1945/07/06 ? ?Today's Date: 08/24/2021 ?OT Individual Time: 6389-3734 ?OT Individual Time Calculation (min): 56 min  ? ? ?Short Term Goals: ?Week 1:  OT Short Term Goal 1 (Week 1): Pt will complete LB bathing at min guard assist sit to stand shower level. ?OT Short Term Goal 2 (Week 1): Pt will complete LB dressing with min guard assist sit to stand. ?OT Short Term Goal 3 (Week 1): Pt will perform shower transfers with use of the RW and min guard to the walk-in shower in his room. ?OT Short Term Goal 4 (Week 1): Pt will perform toilet transfer and toileting with min guard sit to stand from the 3:1 using the RW for support. ? ?Skilled Therapeutic Interventions/Progress Updates:  ?Pt greeted seated in recliner  agreeable to OT intervention. Session focus on BADL reeducation, functional mobility, dynamic standing balance, increasing overall activity tolerance, BUE coordination and decreasing overall caregiver burden.  ?Pt request to wash up at sink, ambulatory tranfser from recliner>w/c at sink with Rw and CGA. Pt completed UBbathing with CGA, pt declined LB bathing, OTA washed pts hair with hair washing tray with total A. Pt brushed hair from w/c with set- up assist. ? Pt transported to gym with total A for time mgmt where pt completed dynamic balance challenge on compliant surface with pt instructed to reach out of BOS to tap stimulus on screen using BITS. Pt needed MIN A for dynamic standing balance on compliant surface, pt reported task to be 9/10 in terms of difficulty. Completed same task on non compliant surface with pt completing task with CGA. Pt using BUEs to reach for stimulus with no LOB noted. Pt able to ambulate back to pts room ~ 40 ft with Rw and CGA. Pt even ambulated into bathroom for toileting with pt completing 3/3 toileting tasks with CGA. Pt ambulated back to EOB with CGA, pt able to doff shoes  with supervision. Supervision for sit>supine. pt left  supine in bed with bed alarm activated and all needs within reach.                   ? ? 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 ?Pain: unrated pain in R shoulder, rest breaks provided as needed, alerted LPN for pain meds at end of session ? ? ? ?Therapy/Group: Individual Therapy ? ?[name] ?08/24/2021, 3:47 PM ?

## 2021-08-24 NOTE — Progress Notes (Signed)
Occupational Therapy Session Note ? ?Patient Details  ?Name: Justin Mann ?MRN: [id number] ?Date of Birth: Aug 28, 1945 ? ?Today's Date: 08/24/2021 ?OT Individual Time: 8889-1694 ?OT Individual Time Calculation (min): 65 min  ? ? ?Short Term Goals: ?Week 1:  OT Short Term Goal 1 (Week 1): Pt will complete LB bathing at min guard assist sit to stand shower level. ?OT Short Term Goal 2 (Week 1): Pt will complete LB dressing with min guard assist sit to stand. ?OT Short Term Goal 3 (Week 1): Pt will perform shower transfers with use of the RW and min guard to the walk-in shower in his room. ?OT Short Term Goal 4 (Week 1): Pt will perform toilet transfer and toileting with min guard sit to stand from the 3:1 using the RW for support. ?Week 2:    ? ?Skilled Therapeutic Interventions/Progress Updates:  ?  Patient seated in recliner upon arrival, patient indicated a pain associated with R shld and stated that nursing had provided tylenol.  The pt indicated that he had to void and was able to ambulate to the toilet coming from sit to stand using a RW and ambulating to the restroom, donning and doffing his LB garments, brief and pants with SBA. The pt was able to donning his LB clothing items at the same LOF. The pt was able to return to his living area and be seated at w/c LOF complete a sit to stand task using ModI assist incorporating a RW for static and dynamic balance.  The pt's balance is altered secondary to poor postural alignment and was instructed in centering his balance.  The pt was instructed in UB theraex using a 1lb dowel with applied oppositional force to improve UB strength.  The pt returned to the recliner with his call light in place, his bedside table within reach, and all addition needs addressed. The pt indicated a pain response of 6 on a 0-10 scale associated with the right shld. I indicated that I would relay that information to nursing.  ? 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  ?Vital Signs: ? ?Pain: ?Pain Assessment ?Pain Scale: 0-10 ?Pain Score: 0-No pain ? ? ?Therapy/Group: Individual Therapy ? ?Yvonne Kendall ?08/24/2021, 10:22 AM ?

## 2021-08-25 NOTE — Progress Notes (Signed)
Physical Therapy Session Note ? ?Patient Details  ?Name: Justin Mann ?MRN: [id number] ?Date of Birth: July 13, 1945 ? ?Today's Date: 08/25/2021 ?PT Individual Time: 1020-1100 ?PT Individual Time Calculation (min): 40 min  ? ?Short Term Goals: ?Week 1:  PT Short Term Goal 1 (Week 1): Pt will perform supine<>sit with CGA ?PT Short Term Goal 2 (Week 1): Pt will perform sit<>stands using LRAD with min assist consistently ?PT Short Term Goal 3 (Week 1): Pt will perform bed<>chair transfers using LRAD with min assist consistently ?PT Short Term Goal 4 (Week 1): Pt will ambulate at least 59f using LRAD with min assist ?PT Short Term Goal 5 (Week 1): Pt will navigate up/down a step using LRAD with min assist to simulate step inside home ? ?Skilled Therapeutic Interventions/Progress Updates:  ?   ? ?Direct handoff of care from OT to start session. Pt agreeable but hesitant due to fatigue. He also reports 6/10 R shoulder pain. LPN notified who provided medications during treatment session. Pt transported in w/c to main rehab gym for energy conservation. ? ?Pt instructed in, and completed FGA, using RW and CGA. Results outlined below. Pt had most difficulty with stepping over obstacle, walking backwards, and change in gait speed. He also struggles with pivot turns, especially to his L, due to decreased R step length. Pt required frequent rest breaks throughout FGA assessment due to fatigue. ? ?Patient demonstrates increased fall risk as noted by score of 6/30 on Functional Gait Assessment.   ?<22/30 = predictive of falls, <20/30 = fall in 6 months, <18/30 = predictive of falls in PD ?MCID: 5 points stroke population, 4 points geriatric population ?(ANPTA Core Set of Outcome Measures for Adults with Neurologic Conditions, 2018) ? ?Pt transported back to room in his w/c for energy conservation, requesting to sit in recliner. Ambulatory transfer with CGA and RW within his room with VC needed for safety awareness while stepping backwards  to sit. Concluded session seated in recliner with chair alarm on, call bell in reach, all needs met.  ? 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  ? ?Balance: ?Balance ?Balance Assessed: Yes ?Standardized Balance Assessment ?Standardized Balance Assessment: Functional Gait Assessment ?Functional Gait  Assessment ?Gait assessed : Yes ?Gait Level Surface: Walks 20 ft, slow speed, abnormal gait pattern, evidence for imbalance or deviates 10-15 in outside of the 12 in walkway width. Requires more than 7 sec to ambulate 20 ft. ?Change in Gait Speed: Makes only minor adjustments to walking speed, or accomplishes a change in speed with significant gait deviations, deviates 10-15 in outside the 12 in walkway width, or changes speed but loses balance but is able to recover and continue walking. ?Gait with Horizontal Head Turns: Performs head turns with moderate changes in gait velocity, slows down, deviates 10-15 in outside 12 in walkway width but recovers, can continue to walk. ?Gait with Vertical Head Turns: Performs task with moderate change in gait velocity, slows down, deviates 10-15 in outside 12 in walkway width but recovers, can continue to walk. ?Gait and Pivot Turn: Turns slowly, requires verbal cueing, or requires several small steps to catch balance following turn and stop ?Step Over Obstacle: Cannot perform without assistance. (with R foot) ?Gait with Narrow Base of Support: Ambulates less than 4 steps heel to toe or cannot perform without assistance. ?Gait with Eyes Closed: Cannot walk 20 ft without assistance,  severe gait deviations or imbalance, deviates greater than 15 in outside 12 in walkway width or will not attempt task. (unable to maintain eyes closed. Opens eyes periodically despite cues) ?Ambulating Backwards: Cannot  walk 20 ft without assistance, severe gait deviations or imbalance, deviates greater than 15 in outside 12 in walkway width or will not attempt task. (Assist for keeping RW close and stability during stepping) ?Steps: Two feet to a stair, must use rail. ?Total Score: 6 ? ?Therapy/Group: Individual Therapy ? ?Erskin Zinda P Gianina Olinde ?08/25/2021, 10:32 AM  ?

## 2021-08-25 NOTE — Progress Notes (Signed)
Physical Therapy Session Note ? ?Patient Details  ?Name: Justin Mann ?MRN: [id number] ?Date of Birth: August 05, 1945 ? ?Today's Date: 08/25/2021 ?PT Individual Time: 8469-6295 ?PT Individual Time Calculation (min): 64 min  ? ?Short Term Goals: ?Week 1:  PT Short Term Goal 1 (Week 1): Pt will perform supine<>sit with CGA ?PT Short Term Goal 2 (Week 1): Pt will perform sit<>stands using LRAD with min assist consistently ?PT Short Term Goal 3 (Week 1): Pt will perform bed<>chair transfers using LRAD with min assist consistently ?PT Short Term Goal 4 (Week 1): Pt will ambulate at least 56ft using LRAD with min assist ?PT Short Term Goal 5 (Week 1): Pt will navigate up/down a step using LRAD with min assist to simulate step inside home ? ?Skilled Therapeutic Interventions/Progress Updates:  ?  Pt received supine bed and agreeable to therapy session. Pt already wearing thigh high TED hose. Supine>sitting R EOB, HOB partially elevated but not using bedrails, with supervision. Donned shoes total assist for time management. Sit>stand EOB>RW with CGA/light min assist for steadying - continues to have wide BOS with delayed trunk/hip extension and anterior weight shift. When standing from a chair with armrests pt often walks his feet backwards underneath his BOS prior to moving hands from armrests to RW.  ? ?Gait training ~158ft towards main therapy gym using RW with CGA/light min assist and therapist bringing w/c in event of fatigue - starts with improved R LE step length for consistent reciprocal pattern and increased R LE foot clearance - with fatigue pt has more prominent anterior lean with increased B UE WBing down through AD (especially down through L UE), decreased R LE foot clearance, and decreased B LE step lengths and gait speed. ? ?Dynamic gait training using R UE support around therapist's shoulders, on AD, 34ft with light min assist for balance - becomes fatigued quickly at end requiring quick transition into sitting in w/c  - continues to start with adequate R foot clearance and step length then becomes fatigued with more shuffled, short step lengths (R more impaired than L) similar to as seen with RW but over shorter distance. ? ?Dynamic gait training using agility ladder via side stepping with R UE support around therapist's shoulders and heavier min assist for balance - requires ~4 steps transitioned to ~2 steps (both feet moved to complete a "step") in order to get both feet into the next square in ladder - increased difficulty stepping towards R requiring increased facilitation for R/L weight shifting. - after stepping to R pt reports feeling "whoozy" or "wobbly" vitals assessed : BP 178/89 (MAP 115), HR 57bpm, SpO2 100% Therapist pulled thigh high TED hose down to around knees. After ~2 minute seated rest break reassessed vitals: BP 166/86 (MAP 109), HR 57bpm, SpO2 99% Pt reports symptoms dissipated and agreeable to continue participating. ? ?Dynamic gait training, R UE support around therapist shoulders, via backwards ambulation ~29ft before pt becomes significantly fearful of falling stating "I've got to sit" and requests to discontinue the task. Pt states "note to self, don't walk backwards" - therapist educated pt on purpose and reinforced this education throughout session when pt naturally had to take some steps backwards to sit in the w/c. ? ?Transitoined to dynamic standing balance task of forward/backwards stepping over hockey stick with R UE support around therapist's shoulders and light mod assist for balance - therapist cuing for sequencing, maintaining upright posture, and weight shifting - pt very hesitant to step backwards over the stick requiring  increased support and cuing.  ? ?Transported back to room and pt reports need to use bathroom. Gait training ~28ft in/out bathroom using RW with CGA. Standing with CGA performed LB clothing management without assist. Despite attempt, pt unable to void. Therapist assisted  with donning clean brief for time management. Standing at sink performed hand hygiene - thearpist educating/cuing pt on proper AD management at counter.  ? ?At end of session, pt left supine in bed with needs in reach, bed alarm on, and meal tray set-up. ? 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 ?Pain: ? Reports R shoulder pain rated as 9/10 - pt reports premedicated and states this is chronic pain - no intervention needed during session. ? ? ?Therapy/Group: Individual Therapy ? ?Tawana Scale , PT, DPT, NCS, CSRS ? ?08/25/2021, 3:29 PM  ?

## 2021-08-25 NOTE — Progress Notes (Signed)
[facility] PHYSICAL MEDICINE & REHABILITATION PROGRESS NOTE ? ?Subjective/Complaints: ? ?Discussed d/c date , chronic RIgh tshoulder pain, did a lot of lifting in his job, no hx of shoulder surgery  ? ?ROS: Denies CP, SOB, N/V/D ? ?Objective: ?Vital Signs: ?Blood pressure (!) 162/87, pulse 62, temperature 98.7 ?F (37.1 ?C), temperature source Oral, resp. rate 18, height 5\' 10"  (1.778 m), weight 88.3 kg, SpO2 94 %. ?No results found. ?Recent Labs  ?  08/22/21 ?0742  ?WBC 7.1  ?HGB 10.6*  ?HCT 31.3*  ?PLT 203  ? ? ?Recent Labs  ?  08/22/21 ?0742  ?NA 140  ?K 3.8  ?CL 106  ?CO2 26  ?GLUCOSE 155*  ?BUN 34*  ?CREATININE 2.04*  ?CALCIUM 8.6*  ? ? ? ?Intake/Output Summary (Last 24 hours) at 08/25/2021 0707 ?Last data filed at 08/25/2021 [phone] ?Gross per 24 hour  ?Intake 560 ml  ?Output 700 ml  ?Net -140 ml  ? ?  ? ?  ? ?Physical Exam: ?BP (!) 162/87 (BP Location: Right Arm)   Pulse 62   Temp 98.7 ?F (37.1 ?C) (Oral)   Resp 18   Ht 5\' 10"  (1.778 m)   Wt 88.3 kg   SpO2 94%   BMI 27.93 kg/m?  ? ?General: No acute distress ?Mood and affect are appropriate ?Heart: Regular rate and rhythm no rubs murmurs or extra sounds ?Lungs: Clear to auscultation, breathing unlabored, no rales or wheezes ?Abdomen: Positive bowel sounds, soft nontender to palpation, nondistended, LLQ colostomy ?Extremities: No clubbing, cyanosis, or edema ?Skin: No evidence of breakdown, no evidence of rash ? ? ?Psych: Flat. ?Musc: No edema in extremities.  No tenderness in extremities. ?Neuro: Alert ?Motor: LUE/LLE: 5/5 proximal distally on RUE: 5/5 proximal distal ?RLE: 4/5 proximal distal ?Mild ataxia R FNF ? ?Assessment/Plan: ?1. Functional deficits which require 3+ hours per day of interdisciplinary therapy in a comprehensive inpatient rehab setting. ?Physiatrist is providing close team supervision and 24 hour management of active medical problems listed below. ?Physiatrist and rehab team continue to assess barriers to discharge/monitor patient  progress toward functional and medical goals ? ? ?Care Tool: ? ?Bathing ?   ?Body parts bathed by patient: Right arm, Left arm, Chest, Abdomen  ?   ?  ?  ?Bathing assist Assist Level: Contact Guard/Touching assist ?  ?  ?Upper Body Dressing/Undressing ?Upper body dressing   ?What is the patient wearing?: Pull over shirt ?   ?Upper body assist Assist Level: Set up assist ?   ?Lower Body Dressing/Undressing ?Lower body dressing ? ? ?   ?What is the patient wearing?: Pants ? ?  ? ?Lower body assist Assist for lower body dressing: Minimal Assistance - Patient > 75% ?   ? ?Toileting ?Toileting    ?Toileting assist Assist for toileting: Contact Guard/Touching assist ?Assistive Device Comment: urinal ?  ?Transfers ?Chair/bed transfer ? ?Transfers assist ?   ? ?Chair/bed transfer assist level: Contact Guard/Touching assist ?Chair/bed transfer assistive device: Armrests, Walker ?  ?Locomotion ?Ambulation ? ? ?Ambulation assist ? ? Ambulation activity did not occur:  (requires use of RW for safety when pt only intermittently used rollator at home) ? ?Assist level: Contact Guard/Touching assist ?Assistive device: Walker-rolling ?Max distance: 75ft  ? ?Walk 10 feet activity ? ? ?Assist ?   ? ?Assist level: Moderate Assistance - Patient - 50 - 74% ?Assistive device: Walker-rolling  ? ?Walk 50 feet activity ? ? ?Assist   ? ?Assist level: Moderate Assistance - Patient - 26 -  74% ?Assistive device: Walker-rolling  ? ? ?Walk 150 feet activity ? ? ?Assist Walk 150 feet activity did not occur: Safety/medical concerns ? ?  ?  ?  ? ?Walk 10 feet on uneven surface  ?activity ? ? ?Assist Walk 10 feet on uneven surfaces activity did not occur: Safety/medical concerns ? ? ?  ?   ? ?Wheelchair ? ? ? ? ?Assist Is the patient using a wheelchair?: Yes (used for transportation to/from therapy gym) ?Type of Wheelchair: Manual ?  ? ?Wheelchair assist level: Dependent - Patient 0% ?   ? ? ?Wheelchair 50 feet with 2 turns activity ? ? ? ?Assist ? ?   ?  ? ? ?Assist Level: Dependent - Patient 0%  ? ?Wheelchair 150 feet activity  ? ? ? ?Assist ?   ? ? ?Assist Level: Dependent - Patient 0%  ? ? ?Medical Problem List and Plan: ?1. 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  Antithrombotics: ?-DVT/anticoagulation:  Pharmaceutical: Lovenox ?            -antiplatelet therapy: DAPT X 3 weeks followed by Plavix ?3. Pain Management: tylenol prn ?4. Mood: Lcsw to follow for evaluation and support ?            -antipsychotic agents: N/A ?5. Neuropsych: This patient is  capable of making decisions on his own behalf. ?6. Skin/Wound Care: Routine pressure relief measures. ?7. Fluids/Electrolytes/Nutrition: Monitor I/Os.  ?8. HTN: Permissive HTN with slow titration of BP meds, continue amlodipine ?  ?Vitals:  ? 08/24/21 2038 08/25/21 0506  ?BP: (!) 159/99 (!) 162/87  ?Pulse: 75 62  ?Resp:  18  ?Temp:  98.7 ?F (37.1 ?C)  ?SpO2:  94%  ?Increase amlodipine to 7.5 mg- monitor for orthostatic changes  ? ?9. CKD: Avoid nephrotoxic meds and hypotensive episodes ?            --baseline SCr-1.95 to 2.08.  ?  ?BMP Latest Ref Rng & Units 08/22/2021 08/20/2021 08/19/2021  ?Glucose 70 - 99 mg/dL 155(H) 126(H) 125(H)  ?BUN 8 - 23 mg/dL 34(H) 38(H) 42(H)  ?Creatinine 0.61 - 1.24 mg/dL 2.04(H) 2.12(H) 1.97(H)  ?Sodium 135 - 145 mmol/L 140 139 140  ?Potassium 3.5 - 5.1 mmol/L 3.8 3.8 3.8  ?Chloride 98 - 111 mmol/L 106 107 107  ?CO2 22 - 32 mmol/L 26 25 25   ?Calcium 8.9 - 10.3 mg/dL 8.6(L) 9.0 8.7(L)  ? 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 Nocturia: Gets up every 2 hours at nights. Has seen urology in the past. Continue flomax 0.8mg  HS.  ?            --will use condom cath at nights to help with sleep.  ?12. Parkinson's Disease: continue Sinemet 25-100mg  2 tablets 4 times per day.   ?13. Insomnia: continue trazodone prn HS ? 14.  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   No orthostatic drop this am  ?Add thigh hi hose  ?15.  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 ?Luanna Salk Kya Mayfield ?08/25/2021, 7:07 AM ? ?

## 2021-08-25 NOTE — Progress Notes (Signed)
Occupational Therapy Session Note ? ?Patient Details  ?Name: Justin Mann ?MRN: [id number] ?Date of Birth: [birth year] ? ?Today's Date: 08/25/2021 ?OT Individual Time: 6812-7517 ?OT Individual Time Calculation (min): 78 min  ? ? ?Short Term Goals: ?Week 1:  OT Short Term Goal 1 (Week 1): Pt will complete LB bathing at min guard assist sit to stand shower level. ?OT Short Term Goal 2 (Week 1): Pt will complete LB dressing with min guard assist sit to stand. ?OT Short Term Goal 3 (Week 1): Pt will perform shower transfers with use of the RW and min guard to the walk-in shower in his room. ?OT Short Term Goal 4 (Week 1): Pt will perform toilet transfer and toileting with min guard sit to stand from the 3:1 using the RW for support. ? ?Skilled Therapeutic Interventions/Progress Updates:  ?Patient met seated in wc in agreement with OT treatment session. 0/10 pain reported at rest and with activity. Patient with desire to complete UB bathing/dressing at sink level. Continues to decline LB bathing/dressing reporting "hassle" to take of thigh high TED hose. UB bathing/dressing at sink level with set-up assist. Functional mobility to rehab gym with RW and CGA. Patient requires several seated rest breaks 2/2 fatigue. Cues for proximity to RW and for clearance of RLE with mobility. In rehab gym focus shifted to dynamic standing balance with/without unilateral UE support on RW while reaching outside of BOS with matching playing card on velco board and with placing pinch pins around basketball hoop. Several sit to stand transfers from various surfaces with CGA and cues as indicated above. Verbal/tactile cues to correct posterior bias. Seated on mat table patient participated in dynamic sitting balance task with reaching outside of BOS to clean items used during treatment session. Active assist stretching of thoracic spine with bath towel around trunk to facilitate anterior lean with functional transfers. Mobility back to room in same  manner as stated above. Session concluded with handoff to PT for treatment session.  ? 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  ? ?Therapy/Group: Individual Therapy ? ?Kenitha Glendinning R Howerton-Davis ?08/25/2021, 7:16 AM ?

## 2021-08-25 NOTE — Progress Notes (Signed)
Occupational Therapy Session Note ? ?Patient Details  ?Name: Justin Mann ?MRN: [id number] ?Date of Birth: 12/29/1945 ? ?Today's Date: 08/25/2021 ?OT Individual Time: 5501-5868 ?OT Individual Time Calculation (min): 26 min  ? ? ?Short Term Goals: ?Week 1:  OT Short Term Goal 1 (Week 1): Pt will complete LB bathing at min guard assist sit to stand shower level. ?OT Short Term Goal 2 (Week 1): Pt will complete LB dressing with min guard assist sit to stand. ?OT Short Term Goal 3 (Week 1): Pt will perform shower transfers with use of the RW and min guard to the walk-in shower in his room. ?OT Short Term Goal 4 (Week 1): Pt will perform toilet transfer and toileting with min guard sit to stand from the 3:1 using the RW for support. ? ? ?Skilled Therapeutic Interventions/Progress Updates:  ?  Pt greeted at time of session bed level resting, agreeable to OT session and did not complain of pain. Pt aware of short OT session, agreeable to bathe in later OT session 2/2 time. Donned TEDS Max A bed level before bed mobility to sit EOB Supervision. Ambulated bed > bathroom with CGA and RW, one mild LOB posteriorly with pt sitting on commode prematurely. Sit > stand again to doff clothing and perform toileting tasks. Pt ambulating bathrooom > sink same manner and standing for hand hygiene. Fatigued and performed oral hygiene seated with set up. Set up in wheelchair with alarm on call bell in reach.  ? 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 ? ? ?Therapy/Group: Individual Therapy ? ?Viona Gilmore ?08/25/2021, 7:16 AM ?

## 2021-08-25 NOTE — Discharge Summary (Signed)
Physical Therapy Discharge Summary  Patient Details  Name: Justin Mann MRN: [id number] Date of Birth: Oct 25, 1945  Patient has met 5 of 9 long term goals due to improved activity tolerance, improved balance, improved postural control, increased strength, and ability to compensate for deficits.  Patient to discharge at an ambulatory level  supervision/CGA  using RW.  Patient's care partner is independent to provide the necessary physical and cognitive assistance at discharge. Family training was completed with his wife on Saturday, 08/27/21. Wife received hands on training and both wife/patient report feeling confident and comfortable regarding upcoming DC. Pt did well in his rehab stay, improving his activity tolerance, functional bed mobility, functional transfers, and gait. He improved his BERG balance test from 5/56 at evaluation to 17/56 at discharge [facility] = 7 points). He remains high falls risk category with follow up therapy services to address falls risk.  Reasons goals not met: Pt requiring minA for dynamic standing balance with goal set to CGA. He required CGA for ambulating both household and controlled environment, using his RW (goals set to supervision). He also required CGA for safely managing 4 steps using 2 hand rails. Family training completed with wife who demonstrated safe guarding for stairs and ambulation at [facility] level.   Recommendation:  Patient will benefit from ongoing skilled PT services in outpatient setting to continue to advance safe functional mobility, address ongoing impairments in cardiopulmonary endurance, dynamic standing balance, dynamic gait training using LRAD, Parkinson's exercises, and minimize fall risk.  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 Rarely or not at all Pain Interference with Therapy Activities: 3. Frequently Pain Interference with Day-to-Day Activities: 1. Rarely or not at all Vision/Perception  Vision - History Ability to See in Adequate Light: 0 Adequate Perception Perception: Within Functional Limits Praxis Praxis: Intact  Cognition Overall Cognitive Status: Within Functional Limits for tasks assessed Arousal/Alertness: Awake/alert Orientation Level: Oriented X4 Year: 2023 Month: March Day of Week: Correct Attention: Sustained;Selective;Focused Focused Attention: Appears intact Sustained Attention: Appears intact Selective Attention: Appears intact Memory: Appears intact Awareness: Appears intact Safety/Judgment: Appears intact Sensation Sensation Light Touch: Impaired Detail Peripheral sensation comments: hx of peripheral neuropathy with decreased sensation below the knees bilaterally Light Touch Impaired Details: Impaired LLE;Impaired RLE Hot/Cold: Not tested Proprioception: Impaired by gross assessment (associated with hx of peripheral neuropathy) Stereognosis: Not tested Coordination Gross Motor Movements are Fluid and Coordinated: No Coordination and Movement Description: GM movements impaired due to 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) and hx of Parkinsonian Disorder resulting in intermittent shuffled/festinating gait although improved since eval Motor  Motor Motor: Hemiplegia;Abnormal postural alignment and control Motor - Discharge Observations: Continues with R hemiparesis although  improved and very flexed posture in standing (which pt reports is baseline)  Mobility Bed Mobility Bed Mobility: Supine to Sit;Sit to Supine Supine to Sit: Independent with assistive device Sit to Supine: Independent with assistive device Transfers Transfers: Sit to Stand;Stand to Sit;Stand Pivot Transfers Sit to Stand: Supervision/Verbal cueing Stand to Sit: Supervision/Verbal cueing Stand Pivot Transfers: Supervision/Verbal cueing  Stand Pivot Transfer Details: Verbal cues for safe use of DME/AE;Verbal cues for technique;Verbal cues for gait pattern;Verbal cues for sequencing Transfer (Assistive device): Rolling walker Locomotion  Gait Ambulation: Yes Gait Assistance: Contact Guard/Touching assist Gait Distance (Feet): 150 Feet Assistive device: Rolling walker Gait Assistance Details: Verbal cues for precautions/safety;Verbal cues for gait pattern;Tactile cues for posture;Tactile cues for weight shifting Gait Gait: Yes Gait Pattern: Impaired Gait Pattern: Poor foot clearance - right;Step-through pattern;Wide base of support;Decreased step length - right;Decreased step length - left;Decreased stride length;Decreased hip/knee flexion - right;Lateral trunk lean to right;Decreased dorsiflexion - right;Decreased weight shift to right;Trunk flexed Gait velocity: decreased Stairs / Additional Locomotion Stairs Assistance: Contact Guard/Touching assist Stair Management Technique: Two rails Number of Stairs: 12 Height of Stairs: 6 Wheelchair Mobility Wheelchair Mobility: No  Trunk/Postural Assessment  Cervical Assessment Cervical Assessment: Exceptions to [facility] (significant cervical protraction) Thoracic Assessment Thoracic Assessment: Exceptions to [facility] (thoracic rounding with bilateral shouler protraction) Lumbar Assessment Lumbar Assessment: Exceptions to [facility] (significant posterior pelvic tilt in sitting) Postural Control Postural Control: Deficits on evaluation Righting Reactions:  delayed and insufficient for moderate LOB with limited ability to utilize ankle strategy due to weak ankle PF/DF and impaired stepping strategy due to Parkinson's  Balance  Balance Balance Assessed: Yes Standardized Balance Assessment Standardized Balance Assessment: Berg Balance Test Berg Balance Test Sit to Stand: Able to stand  independently using hands Standing Unsupported: Able to stand 30 seconds unsupported (able to stand for 57seconds prior to requesting to sit due to feeling unbalanced. Mild sway in standing) Sitting with Back Unsupported but Feet Supported on Floor or Stool: Able to sit safely and securely 2 minutes Stand to Sit: Controls descent by using hands Transfers: Needs one person to assist Standing Unsupported with Eyes Closed: Able to stand 10 seconds with supervision Standing Ubsupported with Feet Together: Needs help to attain position and unable to hold for 15 seconds From Standing, Reach Forward with Outstretched Arm: Loses balance while trying/requires external support From Standing Position, Pick up Object from Floor: Unable to try/needs assist to keep balance From Standing Position, Turn to Look Behind Over each Shoulder: Needs supervision when turning Turn 360 Degrees: Needs assistance while turning Standing Unsupported, Alternately Place Feet on Step/Stool: Needs assistance to keep from falling or unable to try Standing Unsupported, One Foot in Front: Loses balance while stepping or standing Standing on One Leg: Unable to try or needs assist to prevent fall Total Score: 17 Static Sitting Balance Static Sitting - Balance Support: Feet unsupported Static Sitting - Level of Assistance: 7: Independent Dynamic Sitting Balance Dynamic Sitting - Balance Support: Feet supported Dynamic Sitting - Level of Assistance: 5: Stand by assistance Dynamic Sitting - Balance Activities: Forward lean/weight shifting;Reaching for objects;Reaching across midline Static Standing  Balance Static Standing - Balance Support: During functional activity;Bilateral upper extremity supported Static Standing - Level of Assistance: 6: Modified independent (Device/Increase time) Dynamic Standing Balance Dynamic Standing - Balance Support: During functional activity;Bilateral upper extremity supported;Left upper extremity supported Dynamic Standing - Level of Assistance: 4: Min assist;5: Stand by assistance Dynamic Standing - Balance Activities: Compliant surfaces;Reaching across midline;Reaching for objects;Forward lean/weight shifting Dynamic Standing - Comments: pt able to complete dynamic standing balance tasks on non compliant surface with stand by assist but needs up to MIN A if challenging balance on compliant surface Extremity Assessment      RLE Assessment RLE Assessment: Exceptions to [facility] General Strength Comments: assessed in sitting RLE Strength RLE Overall Strength: Deficits Right Hip  Flexion: 3+/5 Right Hip ABduction: 3+/5 Right Knee Flexion: 4-/5 Right Knee Extension: 4/5 Right Ankle Dorsiflexion: 4/5 Right Ankle Plantar Flexion: 3+/5 LLE Assessment LLE Assessment: Exceptions to [facility] General Strength Comments: assessed in sitting LLE Strength LLE Overall Strength: Deficits Left Hip Flexion: 4+/5 Left Hip ABduction: 4+/5 Left Hip ADduction: 4/5 Left Knee Flexion: 4+/5 Left Knee Extension: 5/5 Left Ankle Dorsiflexion: 4/5 Left Ankle Plantar Flexion: 4-/5    Keshana Klemz PT, DPT  08/25/2021, 3:29 PM

## 2021-08-26 MED ORDER — AMLODIPINE BESYLATE 5 MG PO TABS
7.5000 mg | ORAL_TABLET | Freq: Every day | ORAL | Status: DC
  Administered 2021-08-26 – 2021-08-28 (×3): 7.5 mg via ORAL
  Filled 2021-08-26 (×3): qty 1

## 2021-08-26 NOTE — Progress Notes (Signed)
Occupational Therapy Discharge Summary ? ?Patient Details  ?Name: Justin Mann ?MRN: [id number] ?Date of Birth: [birth year] ? ? ? ? ?Patient has met 8 of 8 long term goals due to improved activity tolerance, improved balance, postural control, ability to compensate for deficits, improved attention, and improved coordination.  Pt has made excellent progress during LOS completing ADLS at overall supervision level. Pts wife was present for family education. Pt continues to present with impaired balance, decreased activity tolerance and generalized deconditioning. Patient to discharge at overall Supervision level.  Patient's care partner is independent to provide the necessary physical assistance at discharge.   ? ?Reasons goals not met: shower transfer goal not applicable as pt plans to complete sink baths at home.  ? ?Recommendation:  ?Patient will benefit from ongoing skilled OT services in outpatient setting to continue to advance functional skills in the area of BADL and Reduce care partner burden. ? ?Equipment: ?No DME needs ? ?Reasons for discharge: treatment goals met and discharge from hospital ? ?Patient/family agrees with progress made and goals achieved: Yes ? 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  ?Vital Signs ?Therapy Vitals ?Temp: 98.2 ?F (36.8 ?C) ?Pulse Rate: 71 ?Resp: 17 ?BP: (!) 146/77 ?Patient Position (if appropriate): Sitting ?Oxygen Therapy ?SpO2: 98 % ?O2 Device: Room Air ?Pain ?Pain Assessment ?Pain Scale: 0-10 ?Pain Score: 8  ?Pain Type: Acute pain ?Pain Location: Shoulder ?Pain Orientation: Right ?Pain Descriptors / Indicators: Aching ?Pain Intervention(s): Medication (See eMAR) ?ADL ?ADL ?Eating: Set up ?Where Assessed-Eating: Bed level ?Grooming: Supervision/safety ?Where Assessed-Grooming: Wheelchair,  Sitting at sink ?Upper Body Bathing: Supervision/safety ?Where Assessed-Upper Body Bathing: Wheelchair, Sitting at sink ?Lower Body Bathing: Supervision/safety ?Where Assessed-Lower Body Bathing: Wheelchair ?Upper Body Dressing: Supervision/safety ?Where Assessed-Upper Body Dressing: Wheelchair ?Lower Body Dressing: Supervision/safety ?Where Assessed-Lower Body Dressing: Wheelchair ?Toileting: Supervision/safety ?Where Assessed-Toileting: Toilet ?Toilet Transfer: Close supervision ?Toilet Transfer Method: Ambulating ?Toilet Transfer Equipment: Bedside commode, Other (comment) (BSC over toilet) ?Tub/Shower Transfer: Not assessed ?Walk-In Shower Transfer: Minimal assistance ?Walk-In Shower Transfer Method: Ambulating ?Walk-In Shower Equipment: Civil engineer, contracting with back ?ADL Comments: pt overall completes ADLs with supervision for safety ?Vision ?Baseline Vision/History: 1 Wears glasses ?Patient Visual Report: No change from baseline ?Perception  ?Perception: Within Functional Limits ?Praxis ?Praxis: Intact ?Cognition ?Orientation Level: Oriented X4 ?Year: 2023 ?Month: March ?Day of Week: Correct ?Attention: Sustained;Selective;Focused ?Focused Attention: Appears intact ?Sustained Attention: Appears intact ?Selective Attention: Appears intact ?Memory: Appears intact ?Immediate Memory Recall: Sock;Blue;Bed ?Memory Recall Sock: Without Cue ?Memory Recall Blue: Without Cue ?Memory Recall Bed: Without Cue ?Awareness: Appears intact ?Safety/Judgment: Appears intact ?Sensation ?Sensation ?Light Touch: Impaired Detail ?Peripheral sensation comments: parkinsonism in bilateral hands ( R>L) ?Hot/Cold: Appears Intact ?Proprioception: Impaired by gross assessment (d/t PD) ?Stereognosis: Appears Intact ?Coordination ?Gross Motor Movements are Fluid and Coordinated: No ?Fine Motor Movements are Fluid and Coordinated: No ?Coordination and Movement Description: GM movements impaired due to 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  Sit to Stand: Supervision/Verbal cueing ?Stand to Sit: Supervision/Verbal cueing  ?Trunk/Postural Assessment  ?Cervical Assessment ?Cervical Assessment: Exceptions to [facility] (significant cervical protraction) ?Thoracic Assessment ?Thoracic Assessment: Exceptions to [facility] (thoracic rounding with bilateral shouler protraction) ?Lumbar Assessment ?Lumbar Assessment: Exceptions to [facility] (significant posterior pelvic tilt in sitting) ?Postural Control ?Postural Control: Deficits on evaluation ?Righting Reactions: delayed and insufficient for moderate LOB with limited ability to utilize ankle strategy due to weak ankle PF/DF and impaired stepping strategy due to Parkinson's ?Postural Limitations: postural limitations in trunk, hips, knees noted in standing with hx of Parkinson's premorbidly  ?Balance ?Balance ?Balance Assessed: Yes ?Static Sitting Balance ?Static Sitting - Balance Support: Feet unsupported ?Static Sitting - Level of Assistance: 7: Independent ?Dynamic Sitting Balance ?Dynamic Sitting - Balance Support: Feet supported ?Dynamic Sitting - Level of Assistance: 5: Stand by assistance ?Dynamic Sitting - Balance Activities: Forward lean/weight shifting;Reaching for objects;Reaching across midline ?Static Standing Balance ?Static Standing - Balance Support: During functional activity;Bilateral upper extremity supported ?Static Standing - Level of Assistance: 6: Modified independent  (Device/Increase time) ?Dynamic Standing Balance ?Dynamic Standing - Balance Support: During functional activity;Bilateral upper extremity supported;Left upper extremity supported ?Dynamic Standing - Level of Assistance: 4: Min assist;5: Stand by assistance ?Dynamic Standing - Balance Activities: Compliant surfaces;Reaching across midline;Reaching for objects;Forward lean/weight shifting ?Dynamic Standing - Comments: pt able to complete dynamic standing balance tasks on non compliant surface with stand by assist but needs up to MIN A if challenging balance on compliant surface ?Extremity/Trunk Assessment ?RUE Assessment ?RUE Assessment: Exceptions to [facility] ?Active Range of Motion (AROM) Comments: shoulder flexion 0-120 degrees, all other joints AROM WFLs ?General Strength Comments: Pt with strength 4/5 throughout for all joints.  Slight decreased speed with functional movement compared to the left. ?LUE Assessment ?LUE Assessment: Exceptions to [facility] ?Active Range of Motion (AROM) Comments: Shoulder flexion 0-120 degrees, all other joints AROM WFLs ?General Strength Comments: strength 4+/5 with slight tremor noted at rest ? ? ?[name] ?08/26/2021, 3:58 PM ?

## 2021-08-26 NOTE — Progress Notes (Signed)
[facility] PHYSICAL MEDICINE & REHABILITATION PROGRESS NOTE ? ?Subjective/Complaints: ? ?No issues overnite , good appetite, good colostomy output  ? ?ROS: Denies CP, SOB, N/V/D ? ?Objective: ?Vital Signs: ?Blood pressure (!) 165/77, pulse 61, temperature 99 ?F (37.2 ?C), temperature source Oral, resp. rate 18, height 5\' 10"  (1.778 m), weight 88.3 kg, SpO2 97 %. ?No results found. ?No results for input(s): WBC, HGB, HCT, PLT in the last 72 hours. ? ?No results for input(s): NA, K, CL, CO2, GLUCOSE, BUN, CREATININE, CALCIUM in the last 72 hours. ? ? ?Intake/Output Summary (Last 24 hours) at 08/26/2021 0804 ?Last data filed at 08/26/2021 6283 ?Gross per 24 hour  ?Intake 480 ml  ?Output 1175 ml  ?Net -695 ml  ? ?  ? ?  ? ?Physical Exam: ?BP (!) 165/77 (BP Location: Right Arm)   Pulse 61   Temp 99 ?F (37.2 ?C) (Oral)   Resp 18   Ht 5\' 10"  (1.778 m)   Wt 88.3 kg   SpO2 97%   BMI 27.93 kg/m?  ? ?General: No acute distress ?Mood and affect are appropriate ?Heart: Regular rate and rhythm no rubs murmurs or extra sounds ?Lungs: Clear to auscultation, breathing unlabored, no rales or wheezes ?Abdomen: Positive bowel sounds, soft nontender to palpation, nondistended, LLQ colostomy ?Extremities: No clubbing, cyanosis, or edema ?Skin: No evidence of breakdown, no evidence of rash ? ? ?Psych: Flat. ?Musc: No edema in extremities.  No tenderness in extremities. ?Neuro: Alert ?Motor: LUE/LLE: 5/5 proximal distally on RUE: 5/5 proximal distal ?RLE: 4/5 proximal distal ?Mild ataxia R FNF ? ?Assessment/Plan: ?1. Functional deficits which require 3+ hours per day of interdisciplinary therapy in a comprehensive inpatient rehab setting. ?Physiatrist is providing close team supervision and 24 hour management of active medical problems listed below. ?Physiatrist and rehab team continue to assess barriers to discharge/monitor patient progress toward functional and medical goals ? ? ?Care Tool: ? ?Bathing ?   ?Body parts bathed by  patient: Right arm, Left arm, Chest, Abdomen  ?   ?  ?  ?Bathing assist Assist Level: Contact Guard/Touching assist ?  ?  ?Upper Body Dressing/Undressing ?Upper body dressing   ?What is the patient wearing?: Pull over shirt ?   ?Upper body assist Assist Level: Set up assist ?   ?Lower Body Dressing/Undressing ?Lower body dressing ? ? ?   ?What is the patient wearing?: Pants ? ?  ? ?Lower body assist Assist for lower body dressing: Minimal Assistance - Patient > 75% ?   ? ?Toileting ?Toileting    ?Toileting assist Assist for toileting: Contact Guard/Touching assist ?Assistive Device Comment: urinal ?  ?Transfers ?Chair/bed transfer ? ?Transfers assist ?   ? ?Chair/bed transfer assist level: Contact Guard/Touching assist ?Chair/bed transfer assistive device: Walker, Armrests ?  ?Locomotion ?Ambulation ? ? ?Ambulation assist ? ? Ambulation activity did not occur:  (requires use of RW for safety when pt only intermittently used rollator at home) ? ?Assist level: Minimal Assistance - Patient > 75% ?Assistive device: Walker-rolling ?Max distance: 172ft  ? ?Walk 10 feet activity ? ? ?Assist ?   ? ?Assist level: Moderate Assistance - Patient - 50 - 74% ?Assistive device: Walker-rolling  ? ?Walk 50 feet activity ? ? ?Assist   ? ?Assist level: Moderate Assistance - Patient - 50 - 74% ?Assistive device: Walker-rolling  ? ? ?Walk 150 feet activity ? ? ?Assist Walk 150 feet activity did not occur: Safety/medical concerns ? ?  ?  ?  ? ?Walk 10  feet on uneven surface  ?activity ? ? ?Assist Walk 10 feet on uneven surfaces activity did not occur: Safety/medical concerns ? ? ?  ?   ? ?Wheelchair ? ? ? ? ?Assist Is the patient using a wheelchair?: Yes (used for transportation to/from therapy gym) ?Type of Wheelchair: Manual ?  ? ?Wheelchair assist level: Dependent - Patient 0% ?   ? ? ?Wheelchair 50 feet with 2 turns activity ? ? ? ?Assist ? ?  ?  ? ? ?Assist Level: Dependent - Patient 0%  ? ?Wheelchair 150 feet activity   ? ? ? ?Assist ?   ? ? ?Assist Level: Dependent - Patient 0%  ? ? ?Medical Problem List and Plan: ?1. 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  Antithrombotics: ?-DVT/anticoagulation:  Pharmaceutical: Lovenox ?            -antiplatelet therapy: DAPT X 3 weeks followed by Plavix ?3. Pain Management: tylenol prn ?4. Mood: Lcsw to follow for evaluation and support ?            -antipsychotic agents: N/A ?5. Neuropsych: This patient is  capable of making decisions on his own behalf. ?6. Skin/Wound Care: Routine pressure relief measures. ?7. Fluids/Electrolytes/Nutrition: Monitor I/Os.  ?8. HTN: Permissive HTN with slow titration of BP meds, continue amlodipine ?  ?Vitals:  ? 08/25/21 1927 08/26/21 0355  ?BP: (!) 175/79 (!) 165/77  ?Pulse: (!) 59 61  ?Resp: 16 18  ?Temp: 97.7 ?F (36.5 ?C) 99 ?F (37.2 ?C)  ?SpO2: 96% 97%  ?Increase amlodipine to 7.5 mg- monitor for orthostatic changes  ? ?9. CKD: Avoid nephrotoxic meds and hypotensive episodes ?            --baseline SCr-1.95 to 2.08.  ?  ?BMP Latest Ref Rng & Units 08/22/2021 08/20/2021 08/19/2021  ?Glucose 70 - 99 mg/dL 155(H) 126(H) 125(H)  ?BUN 8 - 23 mg/dL 34(H) 38(H) 42(H)  ?Creatinine 0.61 - 1.24 mg/dL 2.04(H) 2.12(H) 1.97(H)  ?Sodium 135 - 145 mmol/L 140 139 140  ?Potassium 3.5 - 5.1 mmol/L 3.8 3.8 3.8  ?Chloride 98 - 111 mmol/L 106 107 107  ?CO2 22 - 32 mmol/L 26 25 25   ?Calcium 8.9 - 10.3 mg/dL 8.6(L) 9.0 8.7(L)  ? 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 Nocturia: Gets up every 2 hours at nights. Has seen urology in the past. Continue flomax 0.8mg  HS.  ?            --will use condom cath at nights to help with sleep.  ?12. Parkinson's Disease: continue Sinemet 25-100mg  2 tablets 4 times per day.   ?13. Insomnia: continue trazodone prn HS ? 14.  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   No orthostatic drop this am  ?Add thigh hi hose  ?15.  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 ?Justin Mann ?08/26/2021, 8:04 AM ? ?

## 2021-08-26 NOTE — Progress Notes (Signed)
Physical Therapy Session Note ? ?Patient Details  ?Name: Justin Mann ?MRN: [id number] ?Date of Birth: 1945-10-14 ? ?Today's Date: 08/26/2021 ?PT Individual Time: 7371-0626 ?PT Individual Time Calculation (min): 58 min  ? ?Short Term Goals: ?Week 1:  PT Short Term Goal 1 (Week 1): Pt will perform supine<>sit with CGA ?PT Short Term Goal 2 (Week 1): Pt will perform sit<>stands using LRAD with min assist consistently ?PT Short Term Goal 3 (Week 1): Pt will perform bed<>chair transfers using LRAD with min assist consistently ?PT Short Term Goal 4 (Week 1): Pt will ambulate at least 36f using LRAD with min assist ?PT Short Term Goal 5 (Week 1): Pt will navigate up/down a step using LRAD with min assist to simulate step inside home ? ?Skilled Therapeutic Interventions/Progress Updates:  ?   ?Pt presents supine in bed, agreeable to PT tx. Reports "mild" R shoulder pain which he received medication from nursing.  ? ?Pt requesting assistance in getting dressed and changing his brief. Able to doff and don Depends brief without assist in bed. Required minA for donning pants in bed for threading. Donned thigh-high TED's with totalA for time.  ? ?Supine<>sit completed with supervision but some difficulty due to posterior leaning. Even once sitting EOB, he continued to demonstrate posterior bias and leaning that he had difficulty self correcting, requiring minA for support. Able to doff and don clean shirt with setupA, shoes donned totalA. Sit<>Stand to RW from EOB with CGA but continued to have posterior lean in standing that he was unable to correct, resulting in quick sitting to EOB. VC for forward weight shifting, "nose over toes" for standing, and finding center of balance while standing to promote forward weight shifting. Repeated sit<>Stand for practice which improved with reps, CGA overall.  ? ?Ambulatory transfer with CGA and RW to w/c wihtin his room with VC for safety approach to sitting. Pt requesting to complete a few  more self care items sinkside. These were completed at w/c level for energy conservation, without assist.  ? ?Transported in w/c to main rehab gym to continue therapy session. ? ?Curb training for 1 step navigation using RW - 5inch curb. Completed x6 reps total progressing from minA to CDeering Instructional cues also fading from mod to min. Continued to require verbal and visual cueing for keeping body within walker frame and staying close to curb prior to lifting RW and stepping up. ? ?Pt ambulated ~553fwith CGA and RW and was assisted onto Biodex machine with minA for stability. On Biodex, worked on reducing posterior bias in standing to promote forward weight shift. Completed Limits of Stability training with CGA provided for safety. Pt with limited standing tolerance during this activity, which he contributes to fatigue. He did respond well to the training and was able to demonstrate 40/50% weight bearing for anterior/posterior at conclusion.  ? ?Transported back to his room in w/c for energy conservation. Ambulatory transfer within his room with CGA and RW with verbal cueing for safety awareness while stepping backward to recliner. Concluded session seated in recliner with chair alarm on, all needs met, call bell in reach. Informed of upcoming therapy schedule.  ? ? 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  ? ? ?Therapy/Group: Individual Therapy ? ?Vianey Caniglia P Alexx Giambra ?08/26/2021, 7:56 AM  ?

## 2021-08-26 NOTE — Progress Notes (Signed)
Occupational Therapy Session Note ? ?Patient Details  ?Name: Justin Mann ?MRN: [id number] ?Date of Birth: 09-Jun-1946 ? ?Today's Date: 08/26/2021 ?OT Individual Time: 1104-1200 ?OT Individual Time Calculation (min): 56 min  ? ? ?Short Term Goals: ?Week 1:  OT Short Term Goal 1 (Week 1): Pt will complete LB bathing at min guard assist sit to stand shower level. ?OT Short Term Goal 1 - Progress (Week 1): Progressing toward goal ?OT Short Term Goal 2 (Week 1): Pt will complete LB dressing with min guard assist sit to stand. ?OT Short Term Goal 2 - Progress (Week 1): Progressing toward goal ?OT Short Term Goal 3 (Week 1): Pt will perform shower transfers with use of the RW and min guard to the walk-in shower in his room. ?OT Short Term Goal 3 - Progress (Week 1): Met ?OT Short Term Goal 4 (Week 1): Pt will perform toilet transfer and toileting with min guard sit to stand from the 3:1 using the RW for support. ?OT Short Term Goal 4 - Progress (Week 1): Met ?Week 2:  OT Short Term Goal 1 (Week 2): STG=LTGS ? ?Skilled Therapeutic Interventions/Progress Updates:  ?Pt greeted seated in recliner  agreeable to OT intervention. Session focus on BADL reeducation, functional mobility, dynamic standing balance and decreasing overall caregiver burden.  ?Pt completed ambulatory toilet transfer from recliner>bathroom with rw and CGA. Pt completed 3/3 toileting tasks with supervision.   Pt transported to gym with total A where pt completed functional mobility obstacle course to challenge dynamic standing balance and to simulate completing functional mobility at home around tight spaces. Pt completed task with overall CGA. Pt next completed functional mobility task of stepping over cones, pt with difficulty achieving full hip flexion to step over cones with RLE, CGA- MIN A needed to complete task.  Pt also completed reciprocal stepping onto 2 in step with MIN A x5 to simulate stairs at home. Pt completed therapeutic activity of reaching  dynamically for Bean bags to then toss to target, pt completed task with CGA. Pt also able to stand with CGA to clean bean bags with no UE support and CGA for balance to simulate ADL tasks at  home. Pt able ambulate back to room with rw and CGA, pt needed x3 seated rest breaks. Pt left seated in recliner with all needs within reach.                 ? ? 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 ?Pain: no pain reported during session  ? ? ?Therapy/Group: Individual Therapy ? ?[name] ?08/26/2021, 12:18 PM ?

## 2021-08-26 NOTE — Progress Notes (Signed)
Physical Therapy Session Note ? ?Patient Details  ?Name: Justin Mann ?MRN: [id number] ?Date of Birth: 06-07-1946 ? ?Today's Date: 08/26/2021 ?PT Individual Time: 3244-0102 ?PT Individual Time Calculation (min): 75 min  ? ?Short Term Goals: ?Week 1:  PT Short Term Goal 1 (Week 1): Pt will perform supine<>sit with CGA ?PT Short Term Goal 2 (Week 1): Pt will perform sit<>stands using LRAD with min assist consistently ?PT Short Term Goal 3 (Week 1): Pt will perform bed<>chair transfers using LRAD with min assist consistently ?PT Short Term Goal 4 (Week 1): Pt will ambulate at least 32ft using LRAD with min assist ?PT Short Term Goal 5 (Week 1): Pt will navigate up/down a step using LRAD with min assist to simulate step inside home ? ?Skilled Therapeutic Interventions/Progress Updates: Pt presents sitting in recliner and agreeable to therapy.  Pt wheeled x 40' w/ B UEs and supervision w/ cues for hand placement to increase run.  PT wheeled remained of distance to dayroom.  Pt amb w/ RW and CGA to Nu-step.  Pt performed x 3' at level 1 w/ UE and LEs, then Les only x 2'.  Pt performed w/ BLEs x 5' for second trial, verbal cues for maintaining R hip rotation to neutral.  Pt able to correct w/ verbal cues but then fatigues.  Pt amb x 150' w/ RW and CGA, although fatigues at conclusion w/ decreased toe clearance on R w/ decreased step length.  Pt performed cone obstacle course w/ RW and verbal cues for posture and step length to maintain position in RW.  Pt performed cone obstacle course w/ HHA and min to mod A, as well as stepping over canes.  Pt required seated rest break between trials 2/2 fatigue.  Pt returned to room and amb into room from hallway to bed.  Pt transferred sit to supine w/ supervision.  Pt able to scoot self to [facility] w/ supervision.  Bed alarm on and all needs in reach.  ?   ? ?Therapy Documentation ?Precautions:  ?Precautions ?Precautions: Fall, Other (comment) ?Precaution Comments: increased falls in recent hx;  colostomy bag, R hemi (LE>UE) ?Restrictions ?Weight Bearing Restrictions: No ?General: ?  ?Vital Signs: ?Therapy Vitals ?Temp: 98.2 ?F (36.8 ?C) ?Pulse Rate: 71 ?Resp: 17 ?BP: (!) 146/77 ?Patient Position (if appropriate): Sitting ?Oxygen Therapy ?SpO2: 98 % ?O2 Device: Room Air ?Pain:8/10 shoulder ?Pain Assessment ?Pain Scale: 0-10 ?Pain Score: 8  ?Pain Type: Acute pain ?Pain Location: Shoulder ?Pain Orientation: Right ?Pain Descriptors / Indicators: Aching ?Pain Intervention(s): Medication (See eMAR) ? ? ? ? ?Therapy/Group: Individual Therapy ? ?Ladoris Gene ?08/26/2021, 3:48 PM  ?

## 2021-08-26 NOTE — Progress Notes (Signed)
Occupational Therapy Weekly Progress Note ? ?Patient Details  ?Name: Justin Mann ?MRN: [id number] ?Date of Birth: June 02, 1946 ? ?Beginning of progress report period: August 18, 2021 ?End of progress report period: August 26, 2021 ? ?Today's Date: 08/26/2021 ?OT Individual Time:  -  ?   ? ? ?Patient has met 2 of 4 short term goals. Pt continues to present with decreased activity tolerance, generalized weakness and impaired balance. Pt currently requires CGA for bathing from shower level, set- up assist for UB ADLs, MIN A for LB ADLS, CGA for 3/3 toileting tasks, and CGA for ambulatory toileting transfers. This OTA has not seen family present during sessions. Plan to DC 3/7 with 24/7 support from pts wife. Continue with POC.  ? ?Patient continues to demonstrate the following deficits: muscle weakness, decreased cardiorespiratoy endurance, impaired timing and sequencing, unbalanced muscle activation, and decreased motor planning, and decreased standing balance, decreased postural control, and decreased balance strategies and therefore will continue to benefit from skilled OT intervention to enhance overall performance with BADL and Reduce care partner burden. ? ?Patient progressing toward long term goals..  Continue plan of care. ? ?OT Short Term Goals ?Week 1:  OT Short Term Goal 1 (Week 1): Pt will complete LB bathing at min guard assist sit to stand shower level. ?OT Short Term Goal 1 - Progress (Week 1): Progressing toward goal ?OT Short Term Goal 2 (Week 1): Pt will complete LB dressing with min guard assist sit to stand. ?OT Short Term Goal 2 - Progress (Week 1): Progressing toward goal ?OT Short Term Goal 3 (Week 1): Pt will perform shower transfers with use of the RW and min guard to the walk-in shower in his room. ?OT Short Term Goal 3 - Progress (Week 1): Met ?OT Short Term Goal 4 (Week 1): Pt will perform toilet transfer and toileting with min guard sit to stand from the 3:1 using the RW for support. ?OT Short  Term Goal 4 - Progress (Week 1): Met ?Week 2:  OT Short Term Goal 1 (Week 2): STG=LTGS ? ?Skilled Therapeutic Interventions/Progress Updates:  ?   ? 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 ? ? ?Therapy/Group: Individual Therapy ? ?[name] ?08/26/2021, 7:49 AM  ?

## 2021-08-27 DIAGNOSIS — I6330 Cerebral infarction due to thrombosis of unspecified cerebral artery: Secondary | ICD-10-CM

## 2021-08-27 DIAGNOSIS — N1832 Chronic kidney disease, stage 3b: Secondary | ICD-10-CM

## 2021-08-27 DIAGNOSIS — I951 Orthostatic hypotension: Secondary | ICD-10-CM

## 2021-08-27 NOTE — Progress Notes (Signed)
[place] PHYSICAL MEDICINE & REHABILITATION PROGRESS NOTE ? ?Subjective/Complaints: ? ?No major issues. Right arm a little stiff/sore.  ? ?ROS: Patient denies fever, rash, sore throat, blurred vision, dizziness, nausea, vomiting, diarrhea, cough, shortness of breath or chest pain,  back/neck pain, headache, or mood change.  ? ?Objective: ?Vital Signs: ?Blood pressure (!) 160/76, pulse (!) 54, temperature 99 ?F (37.2 ?C), temperature source Oral, resp. rate 14, height '5\' 10"'$  (1.778 m), weight 88.3 kg, SpO2 99 %. ?No results found. ?No results for input(s): WBC, HGB, HCT, PLT in the last 72 hours. ? ?No results for input(s): NA, K, CL, CO2, GLUCOSE, BUN, CREATININE, CALCIUM in the last 72 hours. ? ? ?Intake/Output Summary (Last 24 hours) at 08/27/2021 0942 ?Last data filed at 08/27/2021 6553 ?Gross per 24 hour  ?Intake 351 ml  ?Output 2000 ml  ?Net -1649 ml  ?  ? ?  ? ?Physical Exam: ?BP (!) 160/76 (BP Location: Right Arm)   Pulse (!) 54   Temp 99 ?F (37.2 ?C) (Oral)   Resp 14   Ht '5\' 10"'$  (1.778 m)   Wt 88.3 kg   SpO2 99%   BMI 27.93 kg/m?  ? ?Constitutional: No distress . Vital signs reviewed. ?HEENT: NCAT, EOMI, oral membranes moist ?Neck: supple ?Cardiovascular: RRR without murmur. No JVD    ?Respiratory/Chest: CTA Bilaterally without wheezes or rales. Normal effort    ?GI/Abdomen: BS +, non-tender, non-distended, ostomy ?Ext: no clubbing, cyanosis, or edema ?Psych: flat but cooperative  ?Skin: No evidence of breakdown, no evidence of rash ?Musc: No edema in extremities.  Mild shoulder tenderness with ER/IR ?Neuro: Alert ?Motor: LUE/LLE: 5/5 proximal distally on RUE: 5/5 proximal distal ?RLE: 4/5 proximal distal ?Mild ataxia R FNF ? ?Assessment/Plan: ?1. Functional deficits which require 3+ hours per day of interdisciplinary therapy in a comprehensive inpatient rehab setting. ?Physiatrist is providing close team supervision and 24 hour management of active medical problems listed below. ?Physiatrist and rehab  team continue to assess barriers to discharge/monitor patient progress toward functional and medical goals ? ? ?Care Tool: ? ?Bathing ?   ?Body parts bathed by patient: Right arm, Left arm, Chest, Abdomen  ?   ?  ?  ?Bathing assist Assist Level: Contact Guard/Touching assist ?  ?  ?Upper Body Dressing/Undressing ?Upper body dressing   ?What is the patient wearing?: Pull over shirt ?   ?Upper body assist Assist Level: Set up assist ?   ?Lower Body Dressing/Undressing ?Lower body dressing ? ? ?   ?What is the patient wearing?: Pants ? ?  ? ?Lower body assist Assist for lower body dressing: Minimal Assistance - Patient > 75% ?   ? ?Toileting ?Toileting    ?Toileting assist Assist for toileting: Supervision/Verbal cueing ?Assistive Device Comment: urinal ?  ?Transfers ?Chair/bed transfer ? ?Transfers assist ?   ? ?Chair/bed transfer assist level: Contact Guard/Touching assist ?Chair/bed transfer assistive device: Walker, Armrests ?  ?Locomotion ?Ambulation ? ? ?Ambulation assist ? ? Ambulation activity did not occur:  (requires use of RW for safety when pt only intermittently used rollator at home) ? ?Assist level: Minimal Assistance - Patient > 75% ?Assistive device: Walker-rolling ?Max distance: 170f  ? ?Walk 10 feet activity ? ? ?Assist ?   ? ?Assist level: Minimal Assistance - Patient > 75% ?Assistive device: Walker-rolling  ? ?Walk 50 feet activity ? ? ?Assist   ? ?Assist level: Minimal Assistance - Patient > 75% ?Assistive device: Walker-rolling  ? ? ?Walk 150 feet activity ? ? ?  Assist Walk 150 feet activity did not occur: Safety/medical concerns ? ?Assist level: Minimal Assistance - Patient > 75% ?Assistive device: Walker-rolling ?  ? ?Walk 10 feet on uneven surface  ?activity ? ? ?Assist Walk 10 feet on uneven surfaces activity did not occur: Safety/medical concerns ? ? ?  ?   ? ?Wheelchair ? ? ? ? ?Assist Is the patient using a wheelchair?: Yes ?Type of Wheelchair: Manual ?  ? ?Wheelchair assist level:  Supervision/Verbal cueing ?Max wheelchair distance: 40  ? ? ?Wheelchair 50 feet with 2 turns activity ? ? ? ?Assist ? ?  ?  ? ? ?Assist Level: Dependent - Patient 0%  ? ?Wheelchair 150 feet activity  ? ? ? ?Assist ?   ? ? ?Assist Level: Dependent - Patient 0%  ? ? ?Medical Problem List and Plan: ?1. 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  Antithrombotics: ?-DVT/anticoagulation:  Pharmaceutical: Lovenox ?            -antiplatelet therapy: DAPT X 3 weeks followed by Plavix ?3. Pain Management: tylenol prn ?4. Mood: Lcsw to follow for evaluation and support ?            -antipsychotic agents: N/A ?5. Neuropsych: This patient is  capable of making decisions on his own behalf. ?6. Skin/Wound Care: Routine pressure relief measures. ?7. Fluids/Electrolytes/Nutrition: Monitor I/Os.  ?8. HTN: Permissive HTN with slow titration of BP meds, continue amlodipine ?  ?Vitals:  ? 08/27/21 0357 08/27/21 0459  ?BP: (!) 160/76   ?Pulse: (!) 54   ?Resp: 14   ?Temp: 99 ?F (37.2 ?C)   ?SpO2: 98% 99%  ?-Increased amlodipine to 7.5 mg- received first dose this morning ?-observe 3/4  ? ?9. CKD: Avoid nephrotoxic meds and hypotensive episodes ?            --baseline SCr-1.95 to 2.08.  ?  ?BMP Latest Ref Rng & Units 08/22/2021 08/20/2021 08/19/2021  ?Glucose 70 - 99 mg/dL 155(H) 126(H) 125(H)  ?BUN 8 - 23 mg/dL 34(H) 38(H) 42(H)  ?Creatinine 0.61 - 1.24 mg/dL 2.04(H) 2.12(H) 1.97(H)  ?Sodium 135 - 145 mmol/L 140 139 140  ?Potassium 3.5 - 5.1 mmol/L 3.8 3.8 3.8  ?Chloride 98 - 111 mmol/L 106 107 107  ?CO2 22 - 32 mmol/L '26 25 25  '$ ?Calcium 8.9 - 10.3 mg/dL 8.6(L) 9.0 8.7(L)  ? 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 Nocturia: Gets up every 2 hours at nights. Has seen urology in the past. Continue flomax 0.'8mg'$  HS.  ?            --will use condom cath at nights to help with sleep.  ?12.  Parkinson's Disease: continue Sinemet 25-'100mg'$  2 tablets 4 times per day.   ?13. Insomnia: continue trazodone prn HS ? 14.  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      ?Added thigh hi hose  ?3/4 improved ?15.  Hx rectal Ca LLQ colostomy- cannot use abd binder  ? ? ?LOS: 8 days ?A FACE TO FACE EVALUATION WAS PERFORMED ? ?Meredith Staggers ?08/27/2021, 9:42 AM ? ?

## 2021-08-27 NOTE — Progress Notes (Signed)
Occupational Therapy Session Note ? ?Patient Details  ?Name: Justin Mann ?MRN: [id number] ?Date of Birth: Mar 05, 1946 ? ?Today's Date: 08/27/2021 ?OT Individual Time: 1000-1045 ?OT Individual Time Calculation (min): 45 min  ? ? ?Short Term Goals: ?Week 1:  OT Short Term Goal 1 (Week 1): Pt will complete LB bathing at min guard assist sit to stand shower level. ?OT Short Term Goal 1 - Progress (Week 1): Progressing toward goal ?OT Short Term Goal 2 (Week 1): Pt will complete LB dressing with min guard assist sit to stand. ?OT Short Term Goal 2 - Progress (Week 1): Progressing toward goal ?OT Short Term Goal 3 (Week 1): Pt will perform shower transfers with use of the RW and min guard to the walk-in shower in his room. ?OT Short Term Goal 3 - Progress (Week 1): Met ?OT Short Term Goal 4 (Week 1): Pt will perform toilet transfer and toileting with min guard sit to stand from the 3:1 using the RW for support. ?OT Short Term Goal 4 - Progress (Week 1): Met ? ?Skilled Therapeutic Interventions/Progress Updates:  ?   ?Pt received in bed with no pain. wife present for family education. ? ?Therapeutic activity ?Family education focused on functional transfers and BADL perforamcne at supervision level. Pt able to dress EOB with OT educating on STS with RW, guarding A, key points of control, gait belt placement as well as proximity to pt. OT and family discuss set up of bathroom and practice walk in shower transfers using posterior method. Pt aslso has grab bars and could enter sideways with guarding A. Demoed this method as well. Pt wife provides superviison for ambulatory shower and toilet transfer intiially with cuing to improve proximity to pt, however with repetition no cuing needed. Edu re donning teds with bag v no bag and edu re thigh high length reasoning, however told wife to ask at DC if pt needs thigh high teds ? ? ?Pt left at end of session in  w/c with wife in room, call light in reach and all needs met ? ?Handout  information listed below: ?General mobility- they should always use their RW. They may benefit from a walker tray to transport items from room to room if walking with a walker is recommended by physical therapy. The walker should be kept within reach so they can pull it close to get up and keep with them to back up to any surface they want to sit on. When getting up, they should push up from the surface they are getting up from and reach back when sitting to a new surface, no plopping.  ?You are their ?shadow.? Especially in the beginning. You, as the helper should be in reach of the patient when mobilizing. You should be either beside or behind them so if they lose their balance you can assist by helping correct at the hips. This is likely closer than you are used to being- be in their personal space. Use a gait belt if that makes you feel more comfortable ?If you are attempting to get up/transfer and it is not going well, reset. Have them sit back down. Make sure they are close to the edge of the seat, feet are underneath them at hips distance, and they are leaning forward to stand up. ?Bathing- they should sit to bathe on a shower chair, especially for washing legs/feet. Sitting will save energy and increase safety. ?For a tub shower with shower chair: Use the walker to walk up to  shower/tub edge and leave it to the side, but close. they can use the wall to steady as they step over or a grab bar. Do your best to dry off the floor prior to getting out of the shower ?For tub shower with Tub bench: use walker to get to the edge of the tub bench, back up to the edge, reach back prior to sitting down. Turn to swing legs into tub and scoot across. Reverse to exit the tub. Make sure both legs are out of the tub prior to standing to exit the bathroom ?Walk in shower: walk up to the shower ledge, turn around and back up to the ledge with the walker. Keep both hands-on walker while they step back one foot at a  time ?Dressing- all should be done from a SEATED level, especially to put underwear and pants over feet.  ?Toileting- the RW can be walked right over the toilet for standing urination if applicable. If seated toileting is more appropriate, have them walk up to the toilet and keep walker with them as they turn to sit to toilet or BSC. Before they stand to pull up pants past hips they should pull pants/underwear up past their knees to decrease the need to bend forward to the floor. Sometimes this makes people dizzy ?if incontinence/bathroom accidents are an issue attempt to toilet every 2-3 hours to improve success with toileting and decrease accidents.  ?Energy conservation principles- ?Prioritize what needs to be done and what can be moved to another day ?Plan out their days, weeks, months to spread out taxing (physical or cognitively tiring) activities to not put too much at one time ?Pace activities- rest before feeling tired and have designated places to rest if they feel tired and need to take a brake ?Position for success: sit when able to conserve 25% more energy than standing  ? ? 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 ? ?Therapy/Group: Individual Therapy ? ?Justin Mann ?08/27/2021, 6:43 AM ?

## 2021-08-27 NOTE — Progress Notes (Signed)
Physical Therapy Session Note ? ?Patient Details  ?Name: Justin Mann ?MRN: [id number] ?Date of Birth: 04/14/1946 ? ?Today's Date: 08/27/2021 ?PT Individual Time: 1751-0258 ?PT Individual Time Calculation (min): 61 min  ? ?Short Term Goals: ?Week 1:  PT Short Term Goal 1 (Week 1): Pt will perform supine<>sit with CGA ?PT Short Term Goal 2 (Week 1): Pt will perform sit<>stands using LRAD with min assist consistently ?PT Short Term Goal 3 (Week 1): Pt will perform bed<>chair transfers using LRAD with min assist consistently ?PT Short Term Goal 4 (Week 1): Pt will ambulate at least 47f using LRAD with min assist ?PT Short Term Goal 5 (Week 1): Pt will navigate up/down a step using LRAD with min assist to simulate step inside home ? ? ?Skilled Therapeutic Interventions/Progress Updates:  ?Patient seated upright in w/c on entrance to room. Patient alert and agreeable to PT session. Wife present for family education.  ? ?Patient with no pain complaint throughout session. ? ?Therapeutic Activity: ?Transfers: Patient performed sit<>stand and stand pivot transfers throughout session with supervision/ intermittent CGA. No vc required for technique. ? ?Car transfer performed with pt initially attempting to step L foot into footwell. Corrected pt and instructed both pt and wife re: need to fully turn to sit prior to bringing feet into footwell d/t pt's cotinued weakness in RLE compared to L. He is able to correctly perform and self assist RLE into footwell while wife is able to remove RW.  ? ?Gait Training:  ?Patient ambulated 130 ft using RW with CGA. Demonstrated correct belt placement and hand hold for wife who is able to return demonstrate. Provided vc/ tc for technique to wife ? ?Pt also guided in stair negotiation training up/ down 4 steps using 2 HR initially then using 1HR for second bout. Pt is able to correctly relate and demonstrate leading with LLE to ascend and RLE to descend. Demonstrated to wife correct positioning  and hand holds during first bout. Wife able to return demonstrate with vc for second bout. Importance related for clearing RLE from step prior to descent with each step. Pt's wife is also able to provide HHA to LUE while pt uses RLE on handrail.  ? ?Also instructed wife on use of RW to ascend/ descend single step with CGA. Pt is able to correctly perform and wife return demos correct guarding and hold of gait belt throughout.  ? ?Pt and wife with minimal questions re: pt's mobility after session. ? ?Patient seated upright  in recliner at end of session with brakes locked, belt alarm set, and all needs within reach. Nursing education to begin at end of PT session.  ?   ? 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  ?Vital Signs: ?Therapy Vitals ?Temp: 98.7 ?F (37.1 ?C) ?Pulse Rate: (!) 52 ?Resp: 17 ?BP: (!) 164/82 ?Patient Position (if appropriate): Sitting ?Oxygen Therapy ?SpO2: 98 % ?O2 Device: Room Air ?Pain: ?No pain complaint throughout session.  ? ? ?Therapy/Group: Individual Therapy ? ?JAlger SimonsPT, DPT ?08/27/2021, 2:22 PM  ?

## 2021-08-28 NOTE — Progress Notes (Signed)
Physical Therapy Session Note ? ?Patient Details  ?Name: Justin Mann ?MRN: [id number] ?Date of Birth: January 15, 1946 ? ?Today's Date: 08/28/2021 ?PT Individual Time: 0814-4818 ?PT Individual Time Calculation (min): 53 min  ? ?Short Term Goals: ?Week 1:  PT Short Term Goal 1 (Week 1): Pt will perform supine<>sit with CGA ?PT Short Term Goal 2 (Week 1): Pt will perform sit<>stands using LRAD with min assist consistently ?PT Short Term Goal 3 (Week 1): Pt will perform bed<>chair transfers using LRAD with min assist consistently ?PT Short Term Goal 4 (Week 1): Pt will ambulate at least 74f using LRAD with min assist ?PT Short Term Goal 5 (Week 1): Pt will navigate up/down a step using LRAD with min assist to simulate step inside home ?  ? ?Skilled Therapeutic Interventions/Progress Updates:  ?Pt seated in recliner; wife present.  He denied pain.  Sit> stand to RW, supervision without cues.  Gait in tight space with RW level tile x 10' to wc.   ?neuromuscular re-education via demo, forced use, multimodal cues: seated- bil heel raises x 25, R/L long arc knee extensions iwht 5 ankle pumps at q end range. ?Standing with bil UE support: 10 x 1 each- heel raises, toe raises, mini squats.  Propelling wc x 25' using bil LEs only, focusing on R hamstring activation. ? ?Standing with LUE support, kicking 10 cones with R foot, focusing on hamstring curl to initiate kick, and ankle DF for "lift" of cone into air, with good results. ? ?Gait training with RW on level tile x 150', close supervision> CGA turning and as he became fatigued and RLE stepping slowed. Up/down 4 steps, 2 rails, with CGA nd 1 cue to place entire R foot on tread, step-to method. ? ?At end of session, pt seated in recliner with seat pad alarm on, needs at hand and wife present. ? ?   ? ?Therapy Documentation ?Precautions:  ?Precautions ?Precautions: Fall, Other (comment) ?Precaution Comments: increased falls in recent hx; colostomy bag, R hemi  (LE>UE) ?Restrictions ?Weight Bearing Restrictions: No ? ?   ? ? ? ?Therapy/Group: Individual Therapy ? ?Summer Parthasarathy ?08/28/2021, 4:51 PM  ?

## 2021-08-29 LAB — BASIC METABOLIC PANEL
Anion gap: 6 (ref 5–15)
BUN: 30 mg/dL — ABNORMAL HIGH (ref 8–23)
CO2: 27 mmol/L (ref 22–32)
Calcium: 8.8 mg/dL — ABNORMAL LOW (ref 8.9–10.3)
Chloride: 108 mmol/L (ref 98–111)
Creatinine, Ser: 2.1 mg/dL — ABNORMAL HIGH (ref 0.61–1.24)
GFR, Estimated: 33 mL/min — ABNORMAL LOW (ref >60)
Glucose, Bld: 117 mg/dL — ABNORMAL HIGH (ref 70–99)
Potassium: 4.1 mmol/L (ref 3.5–5.1)
Sodium: 141 mmol/L (ref 135–145)

## 2021-08-29 LAB — CBC
HCT: 31.3 % — ABNORMAL LOW (ref 39.0–52.0)
Hemoglobin: 10.2 g/dL — ABNORMAL LOW (ref 13.0–17.0)
MCH: 28 pg (ref 26.0–34.0)
MCHC: 32.6 g/dL (ref 30.0–36.0)
MCV: 86 fL (ref 80.0–100.0)
Platelets: 207 10*3/uL (ref 150–400)
RBC: 3.64 MIL/uL — ABNORMAL LOW (ref 4.22–5.81)
RDW: 13.4 % (ref 11.5–15.5)
WBC: 6.7 10*3/uL (ref 4.0–10.5)
nRBC: 0 % (ref 0.0–0.2)

## 2021-08-29 MED ORDER — AMLODIPINE BESYLATE 10 MG PO TABS
10.0000 mg | ORAL_TABLET | Freq: Every day | ORAL | Status: DC
  Administered 2021-08-29: 10 mg via ORAL
  Filled 2021-08-29: qty 1

## 2021-08-29 NOTE — Plan of Care (Signed)
?  Problem: RH BOWEL ELIMINATION ?Goal: RH STG MANAGE BOWEL WITH ASSISTANCE ?Description: STG Manage Bowel with mod I  Assistance. ?Outcome: Not Progressing; colostomy ?  ?

## 2021-08-29 NOTE — Progress Notes (Signed)
Occupational Therapy Session Note ? ?Patient Details  ?Name: Justin Mann ?MRN: [id number] ?Date of Birth: 12/10/1945 ? ?Today's Date: 08/29/2021 ?OT Individual Time: 6073-7106 ?OT Individual Time Calculation (min): 55 min  ? ? ?Short Term Goals: ?Week 2:  OT Short Term Goal 1 (Week 2): STG=LTGS ? ?Skilled Therapeutic Interventions/Progress Updates:  ?Pt greeted supine in bed with nursing students present, pt agreeable to OT intervention. Session focus on BADL reeducation, functional mobility, dynamic standing balance and decreasing overall caregiver burden. Pt completed supine>sit from flat HOB independently. Pt completed sit>stand with supervision with RW, ambulatory toilet transfer to bathroom with RW and supervision. Pt completed 3/3 toileting tasks with supervision. Pt completed LB dressing from toilet with supervision. Pt ambulated to sink for bathing with supervision. Pt completed UB bathing only with supervision. Total A to don teds with pt reporting wife was familiar with TED donning technique. Pt able to don shoes with set- up assist. Pt completed UB dressing with supervision, and supervision to don shoes. Pt transported to gym with total A to work on dynamic standing balance, pt completed ball tosses in standing with Nursing student to challenge dynamic standing balance as well as righting reactions. Pt completed task with CGA and abel to stand for 53 secs before needing to sit. Pt reports needing to sit d/t feeling "off balance."  Pt completed functional ambulation back to room with rw and supervision. pt left seated in recliner with nursign student present.                   ? ? 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 ?Pain: unrated pain in R shoulder, rest breaks and repositioning provided as needed.  ? ? ? ?Therapy/Group: Individual  Therapy ? ?[name] ?08/29/2021, 8:59 AM ?

## 2021-08-29 NOTE — Progress Notes (Signed)
Physical Therapy Session Note ? ?Patient Details  ?Name: Justin Mann ?MRN: [id number] ?Date of Birth: [birth year] ? ?Today's Date: 08/29/2021 ?PT Individual Time: 0938-1829 + 1435-1530 ?PT Individual Time Calculation (min): 57 min  + 55 min ? ?Short Term Goals: ?Week 1:  PT Short Term Goal 1 (Week 1): Pt will perform supine<>sit with CGA ?PT Short Term Goal 2 (Week 1): Pt will perform sit<>stands using LRAD with min assist consistently ?PT Short Term Goal 3 (Week 1): Pt will perform bed<>chair transfers using LRAD with min assist consistently ?PT Short Term Goal 4 (Week 1): Pt will ambulate at least 1f using LRAD with min assist ?PT Short Term Goal 5 (Week 1): Pt will navigate up/down a step using LRAD with min assist to simulate step inside home ? ?Skilled Therapeutic Interventions/Progress Updates:  ?   ?1st session: ?Pt sitting recliner at start of session. Agreeable to PT tx. Reports 5/10 R shoulder pain, chronic in nature. Emotional support, distraction, and mobility provided for pain management. Sit<>stand to RW with supervision and ambulated within his room with CGA and RW to w/c. Transported in w/c to main rehab gym for time and energy conservation. Completed BERG balance test with results outlined above. Pt requiring frequent rest breaks due to fatigue throughout balance assessment., ? ?Patient demonstrates increased fall risk as noted by score of 17/56 on Berg Balance Scale.  (<36= high risk for falls, close to 100%; 37-45 significant >80%; 46-51 moderate >50%; 52-55 lower >25%) ? ?Instructed in gait training, ambulating ~1013fwith CGA and RW within rehab hallways - able to begin gait with minimal gait deficits but after ~5038fnd onset of fatigue, pt demonstrates R foot drag with poor R foot clearance with mild circumduction for compensation. ? ?[facility] 2 hand rails, able to navigate up/down x12 steps without rest break. Pt with appropriate understanding and recall of  technique, ascending with L foot leading and descending with R foot leading.  ? ?Transported back to room in w/c for time. Completed ambulatory transfer within room with CGA and RW - VC for safety while stepping backwards to recliner. Chair alarm on, all needs within reach at end of tx.  ? ?2nd session: ?Pt sitting in recliner to start session. Agreeable to PT tx - reports typical R shoulder pain. Distractions and mobility provided for pain management. ? ?His RW had been delivered to his room. Adjusted to fit and switched out rubber stoppers to improve glide along tile floor. Sit<>stand to RW with supervision from recliner height and ambulated within his room with CGA and RW to w/c. Transported in w/c outdoors to practice community gait on unlevel concrete surfaces. ? ?While outdoors, he ambulated variable distances, ~75 to 100f55fth CGA and RW. Speed decreased outdoors compared to indoors and increased R foot drag on unlevel surfaces. We practiced transfers to various sitting surfaces such as park benches and chairs without arm rests. We also discussed fall prevention strategies, home safety, energy conservation, and use of emergency services in case of a fall. Pt appreciative of all education. ? ?Returned upstairs to rehab floor in his w/c where we practiced gait training in ADL apartment on carpeted surfaces, as well as furniture transfers from low sitting sofa couch. Gait in ADL apartment with CGA and RW and supervision assist for furniture transfers. ? ?Returned to his room where he requested to return to recliner. Ambulated within his room with CGA and RW with less cues needed for safety  approach to sitting surface. Remained seated in recliner with chair alarm on, call bell in reach, all needs within reach.  ? 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  ? ?Balance  ?Balance ?Balance Assessed: Yes ?Standardized Balance Assessment ?Standardized Balance Assessment: Merrilee Jansky Balance Test ?Merrilee Jansky Balance Test ?Sit to Stand: Able to stand  independently using hands ?Standing Unsupported: Able to stand 30 seconds unsupported (able to stand for 57seconds prior to requesting to sit due to feeling unbalanced. Mild sway in standing) ?Sitting with Back Unsupported but Feet Supported on Floor or Stool: Able to sit safely and securely 2 minutes ?Stand to Sit: Controls descent by using hands ?Transfers: Needs one person to assist ?Standing Unsupported with Eyes Closed: Able to stand 10 seconds with supervision ?Standing Ubsupported with Feet Together: Needs help to attain position and unable to hold for 15 seconds ?From Standing, Reach Forward with Outstretched Arm: Loses balance while trying/requires external support ?From Standing Position, Pick up Object from Floor: Unable to try/needs assist to keep balance ?From Standing Position, Turn to Look Behind Over each Shoulder: Needs supervision when turning ?Turn 360 Degrees: Needs assistance while turning ?Standing Unsupported, Alternately Place Feet on Step/Stool: Needs assistance to keep from falling or unable to try ?Standing Unsupported, One Foot in Front: Loses balance while stepping or standing ?Standing on One Leg: Unable to try or needs assist to prevent fall ?Total Score: 17/56 ? ? ?Therapy/Group: Individual Therapy ? ?Delmi Fulfer P [place] ?08/29/2021, 7:38 AM  ?

## 2021-08-29 NOTE — Progress Notes (Signed)
Patient ID: Justin Mann, male   DOB: 1945-08-11, 76 y.o.   MRN: [id number] ? ?Rolling Walker ordered through [employer]. ?

## 2021-08-29 NOTE — Progress Notes (Signed)
Patient ID: Justin Mann, male   DOB: Nov 22, 1945, 76 y.o.   MRN: [id number] ? ?SW made 2x attempts to reach patient [place] full, unable to leave VM.  ? ?SW left VM with [facility] supervisor [employer]. SW waiting for follow up to provide pt recommendations. ?

## 2021-08-29 NOTE — Progress Notes (Signed)
Occupational Therapy Session Note ? ?Patient Details  ?Name: Justin Mann ?MRN: [id number] ?Date of Birth: April 30, 1946 ? ?Today's Date: 08/29/2021 ?OT Individual Time: 3125-0871 ?OT Individual Time Calculation (min): 24 min  ? ? ?Short Term Goals: ?Week 1:  OT Short Term Goal 1 (Week 1): Pt will complete LB bathing at min guard assist sit to stand shower level. ?OT Short Term Goal 1 - Progress (Week 1): Progressing toward goal ?OT Short Term Goal 2 (Week 1): Pt will complete LB dressing with min guard assist sit to stand. ?OT Short Term Goal 2 - Progress (Week 1): Progressing toward goal ?OT Short Term Goal 3 (Week 1): Pt will perform shower transfers with use of the RW and min guard to the walk-in shower in his room. ?OT Short Term Goal 3 - Progress (Week 1): Met ?OT Short Term Goal 4 (Week 1): Pt will perform toilet transfer and toileting with min guard sit to stand from the 3:1 using the RW for support. ?OT Short Term Goal 4 - Progress (Week 1): Met ?Week 2:  OT Short Term Goal 1 (Week 2): STG=LTGS ? ? ?Skilled Therapeutic Interventions/Progress Updates:  ?  Pt greeted at time of session sitting up in recliner agreeable to OT session, denied needing to use bathroom and no pain at rest. Pt states ADL needs met. Ambulated short distance in room > wheelchair Supervision with RW and performed all transfers in this manner throughout session. Transported to gym and focused on BUE strengthening in standing with mirror for feedback with 3# dowel for the following: bicep curl, chest press, modified incline press (2/2 old shoulder injury per pt) and did one set seated for fwd circles/rows 2/2 fatigue. Transported back to room and set up in recliner call bell in reach all needs met.  ? 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 ? ? ? ?Therapy/Group:  Individual Therapy ? ?Viona Gilmore ?08/29/2021, 12:55 PM ?

## 2021-08-29 NOTE — Progress Notes (Signed)
Inpatient Rehabilitation Discharge Medication Review by a Pharmacist ? ?A complete drug regimen review was completed for this patient to identify any potential clinically significant medication issues. ? ?High Risk Drug Classes Is patient taking? Indication by Medication  ?Antipsychotic No   ?Anticoagulant No   ?Antibiotic No   ?Opioid No   ?Antiplatelet Yes Plavix, aspirin for CVA ppx  ?Hypoglycemics/insulin No   ?Vasoactive Medication Yes Amlodipine for BP  ?Chemotherapy No   ?Other Yes Atrovastatin for HLD ?Proscar, Flomax for BPH ?Sinemet for PD ?Loratadine for allergies  ? ? ? ?Type of Medication Issue Identified Description of Issue Recommendation(s)  ?Drug Interaction(s) (clinically significant) ?    ?Duplicate Therapy ?    ?Allergy ?    ?No Medication Administration End Date ?    ?Incorrect Dose ?    ?Additional Drug Therapy Needed ?    ?Significant med changes from prior encounter (inform family/care partners about these prior to discharge).    ?Other ?    ? ? ?Clinically significant medication issues were identified that warrant physician communication and completion of prescribed/recommended actions by midnight of the next day:  No ? ?Pharmacist comments: None ? ?Time spent performing this drug regimen review (minutes): 20 minutes ? ? ?Tad Moore ?08/29/2021 8:20 AM ?

## 2021-08-29 NOTE — Progress Notes (Signed)
[place] PHYSICAL MEDICINE & REHABILITATION PROGRESS NOTE ? ?Subjective/Complaints: ?No arm pain, slept ok, going to gym with OT this am  ? ?ROS: Patient denies CP, SOB, N/V/D ? ?Objective: ?Vital Signs: ?Blood pressure (!) 160/80, pulse 61, temperature 98.7 ?F (37.1 ?C), temperature source Oral, resp. rate 17, height '5\' 10"'$  (1.778 m), weight 88.3 kg, SpO2 96 %. ?No results found. ?Recent Labs  ?  08/29/21 ?0174  ?WBC 6.7  ?HGB 10.2*  ?HCT 31.3*  ?PLT 207  ? ? ?Recent Labs  ?  08/29/21 ?9449  ?NA 141  ?K 4.1  ?CL 108  ?CO2 27  ?GLUCOSE 117*  ?BUN 30*  ?CREATININE 2.10*  ?CALCIUM 8.8*  ? ? ? ?Intake/Output Summary (Last 24 hours) at 08/29/2021 0837 ?Last data filed at 08/29/2021 6759 ?Gross per 24 hour  ?Intake 591 ml  ?Output 500 ml  ?Net 91 ml  ? ?  ? ?  ? ?Physical Exam: ?BP (!) 160/80 (BP Location: Right Arm)   Pulse 61   Temp 98.7 ?F (37.1 ?C) (Oral)   Resp 17   Ht '5\' 10"'$  (1.778 m)   Wt 88.3 kg   SpO2 96%   BMI 27.93 kg/m?  ? ? ?General: No acute distress ?Mood and affect are appropriate ?Heart: Regular rate and rhythm no rubs murmurs or extra sounds ?Lungs: Clear to auscultation, breathing unlabored, no rales or wheezes ?Abdomen: Positive bowel sounds, soft nontender to palpation, nondistended ?Extremities: No clubbing, cyanosis, or edema ?Skin: No evidence of breakdown, no evidence of rash ? ? ?Psych: flat but cooperative  ?Skin: No evidence of breakdown, no evidence of rash ?Musc: No edema in extremities.  Mild shoulder tenderness with ER/IR ?Neuro: Alert ?Motor: LUE/LLE: 5/5 proximal distally on RUE: 5/5 proximal distal ?RLE: 4/5 proximal distal ?Mild ataxia R FNF ? ?Assessment/Plan: ?1. Functional deficits which require 3+ hours per day of interdisciplinary therapy in a comprehensive inpatient rehab setting. ?Physiatrist is providing close team supervision and 24 hour management of active medical problems listed below. ?Physiatrist and rehab team continue to assess barriers to discharge/monitor  patient progress toward functional and medical goals ? ? ?Care Tool: ? ?Bathing ?   ?Body parts bathed by patient: Right arm, Left arm, Chest, Abdomen  ?   ?  ?  ?Bathing assist Assist Level: Contact Guard/Touching assist ?  ?  ?Upper Body Dressing/Undressing ?Upper body dressing   ?What is the patient wearing?: Pull over shirt ?   ?Upper body assist Assist Level: Set up assist ?   ?Lower Body Dressing/Undressing ?Lower body dressing ? ? ?   ?What is the patient wearing?: Pants ? ?  ? ?Lower body assist Assist for lower body dressing: Minimal Assistance - Patient > 75% ?   ? ?Toileting ?Toileting    ?Toileting assist Assist for toileting: Supervision/Verbal cueing ?Assistive Device Comment: urinal ?  ?Transfers ?Chair/bed transfer ? ?Transfers assist ?   ? ?Chair/bed transfer assist level: Contact Guard/Touching assist ?Chair/bed transfer assistive device: Walker, Armrests ?  ?Locomotion ?Ambulation ? ? ?Ambulation assist ? ? Ambulation activity did not occur:  (requires use of RW for safety when pt only intermittently used rollator at home) ? ?Assist level: Contact Guard/Touching assist ?Assistive device: Walker-rolling ?Max distance: 150  ? ?Walk 10 feet activity ? ? ?Assist ?   ? ?Assist level: Supervision/Verbal cueing ?Assistive device: Walker-rolling  ? ?Walk 50 feet activity ? ? ?Assist   ? ?Assist level: Supervision/Verbal cueing ?Assistive device: Walker-rolling  ? ? ?Walk 150 feet  activity ? ? ?Assist Walk 150 feet activity did not occur: Safety/medical concerns ? ?Assist level: Contact Guard/Touching assist ?Assistive device: Walker-rolling ?  ? ?Walk 10 feet on uneven surface  ?activity ? ? ?Assist Walk 10 feet on uneven surfaces activity did not occur: Safety/medical concerns ? ? ?  ?   ? ?Wheelchair ? ? ? ? ?Assist Is the patient using a wheelchair?: Yes ?Type of Wheelchair: Manual ?  ? ?Wheelchair assist level: Supervision/Verbal cueing ?Max wheelchair distance: 25  ? ? ?Wheelchair 50 feet with 2  turns activity ? ? ? ?Assist ? ?  ?  ? ? ?Assist Level: Dependent - Patient 0%  ? ?Wheelchair 150 feet activity  ? ? ? ?Assist ?   ? ? ?Assist Level: Dependent - Patient 0%  ? ? ?Medical Problem List and Plan: ?1. 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  Antithrombotics: ?-DVT/anticoagulation:  Pharmaceutical: Lovenox ?            -antiplatelet therapy: DAPT X 3 weeks followed by Plavix ?3. Pain Management: tylenol prn ?4. Mood: Lcsw to follow for evaluation and support ?            -antipsychotic agents: N/A ?5. Neuropsych: This patient is  capable of making decisions on his own behalf. ?6. Skin/Wound Care: Routine pressure relief measures. ?7. Fluids/Electrolytes/Nutrition: Monitor I/Os.  ?8. HTN: Permissive HTN with slow titration of BP meds, continue amlodipine ?  ?Vitals:  ? 08/29/21 0432 08/29/21 0700  ?BP:  (!) 160/80  ?Pulse:  61  ?Resp:  17  ?Temp: 98.9 ?F (37.2 ?C) 98.7 ?F (37.1 ?C)  ?SpO2:  96%  ?-Increased amlodipine to '10mg'$  on 3/6 ? ? ?9. CKD: Avoid nephrotoxic meds and hypotensive episodes ?            --baseline SCr-1.95 to 2.08.  ?  ?BMP Latest Ref Rng & Units 08/29/2021 08/22/2021 08/20/2021  ?Glucose 70 - 99 mg/dL 117(H) 155(H) 126(H)  ?BUN 8 - 23 mg/dL 30(H) 34(H) 38(H)  ?Creatinine 0.61 - 1.24 mg/dL 2.10(H) 2.04(H) 2.12(H)  ?Sodium 135 - 145 mmol/L 141 140 139  ?Potassium 3.5 - 5.1 mmol/L 4.1 3.8 3.8  ?Chloride 98 - 111 mmol/L 108 106 107  ?CO2 22 - 32 mmol/L '27 26 25  '$ ?Calcium 8.9 - 10.3 mg/dL 8.8(L) 8.6(L) 9.0  ? 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 Nocturia: Gets up every 2 hours at nights. Has seen urology in the past. Continue flomax 0.'8mg'$  HS.  ?            --will use condom cath at nights to help with sleep.  ?12. Parkinson's Disease: continue Sinemet 25-'100mg'$  2 tablets 4 times per day.   ?13. Insomnia: continue trazodone prn HS ? 14.  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      ?Added thigh hi hose  ?3/4 improved ?15.  Hx rectal Ca LLQ colostomy- cannot use abd binder  ? ? ?LOS: 10 days ?A FACE TO FACE EVALUATION WAS PERFORMED ? ?Justin Mann ?08/29/2021, 8:37 AM ? ?

## 2021-08-30 MED ORDER — AMLODIPINE BESYLATE 5 MG PO TABS: 10.0000 mg | ORAL_TABLET | Freq: Every day | ORAL | Status: DC

## 2021-08-30 MED ORDER — LORATADINE 10 MG PO TABS: 10.0000 mg | ORAL_TABLET | Freq: Every day | ORAL | Status: AC

## 2021-08-30 MED ORDER — CLOPIDOGREL BISULFATE 75 MG PO TABS: 75.0000 mg | ORAL_TABLET | Freq: Every day | ORAL | 0 refills | Status: DC

## 2021-08-30 MED ORDER — ASPIRIN 81 MG PO TBEC: 81.0000 mg | DELAYED_RELEASE_TABLET | Freq: Every day | ORAL | 0 refills | Status: DC

## 2021-08-30 MED ORDER — POLYETHYLENE GLYCOL 3350 17 G PO PACK
17.0000 g | PACK | Freq: Every day | ORAL | 0 refills | Status: AC
Start: 2021-08-31 — End: ?

## 2021-08-30 MED ORDER — ATORVASTATIN CALCIUM 80 MG PO TABS: 80.0000 mg | ORAL_TABLET | Freq: Every day | ORAL | 0 refills | Status: DC

## 2021-08-30 NOTE — Progress Notes (Signed)
[place] PHYSICAL MEDICINE & REHABILITATION PROGRESS NOTE ? ?Subjective/Complaints: ? ? ?No issues overnite , pt feels well looking forward to discharge  ? ?ROS: Patient denies CP, SOB, N/V/D ? ?Objective: ?Vital Signs: ?Blood pressure (!) 161/75, pulse 61, temperature 98.9 ?F (37.2 ?C), temperature source Oral, resp. rate 16, height '5\' 10"'$  (1.778 m), weight 88.3 kg, SpO2 98 %. ?No results found. ?Recent Labs  ?  08/29/21 ?7035  ?WBC 6.7  ?HGB 10.2*  ?HCT 31.3*  ?PLT 207  ? ? ? ?Recent Labs  ?  08/29/21 ?0093  ?NA 141  ?K 4.1  ?CL 108  ?CO2 27  ?GLUCOSE 117*  ?BUN 30*  ?CREATININE 2.10*  ?CALCIUM 8.8*  ? ? ? ? ?Intake/Output Summary (Last 24 hours) at 08/30/2021 0817 ?Last data filed at 08/30/2021 0400 ?Gross per 24 hour  ?Intake 590 ml  ?Output 450 ml  ?Net 140 ml  ? ?  ? ?  ? ?Physical Exam: ?BP (!) 161/75 (BP Location: Right Arm)   Pulse 61   Temp 98.9 ?F (37.2 ?C) (Oral)   Resp 16   Ht '5\' 10"'$  (1.778 m)   Wt 88.3 kg   SpO2 98%   BMI 27.93 kg/m?  ? ? ?General: No acute distress ?Mood and affect are appropriate ?Heart: Regular rate and rhythm no rubs murmurs or extra sounds ?Lungs: Clear to auscultation, breathing unlabored, no rales or wheezes ?Abdomen: Positive bowel sounds, soft nontender to palpation, nondistended ?Extremities: No clubbing, cyanosis, or edema ?Skin: No evidence of breakdown, no evidence of rash ? ? ?Psych: flat but cooperative  ?Skin: No evidence of breakdown, no evidence of rash ?Musc: No edema in extremities.  Mild shoulder tenderness with ER/IR ?Neuro: Alert ?Motor: LUE/LLE: 5/5 proximal distally on RUE: 5/5 proximal distal ?RLE: 4/5 proximal distal ?Mild ataxia R FNF ? ?Assessment/Plan: ?1. Functional deficits which require 3+ hours per day of interdisciplinary therapy in a comprehensive inpatient rehab setting. ?Physiatrist is providing close team supervision and 24 hour management of active medical problems listed below. ?Physiatrist and rehab team continue to assess barriers to  discharge/monitor patient progress toward functional and medical goals ? ? ?Care Tool: ? ?Bathing ?   ?Body parts bathed by patient: Left arm, Right arm, Chest, Abdomen, Front perineal area, Face  ?   ?  ?  ?Bathing assist Assist Level: Supervision/Verbal cueing ?  ?  ?Upper Body Dressing/Undressing ?Upper body dressing   ?What is the patient wearing?: Pull over shirt ?   ?Upper body assist Assist Level: Supervision/Verbal cueing ?   ?Lower Body Dressing/Undressing ?Lower body dressing ? ? ?   ?What is the patient wearing?: Pants, Underwear/pull up ? ?  ? ?Lower body assist Assist for lower body dressing: Supervision/Verbal cueing ?   ? ?Toileting ?Toileting    ?Toileting assist Assist for toileting: Supervision/Verbal cueing ?Assistive Device Comment: urinal ?  ?Transfers ?Chair/bed transfer ? ?Transfers assist ?   ? ?Chair/bed transfer assist level: Supervision/Verbal cueing ?Chair/bed transfer assistive device: Walker, Armrests ?  ?Locomotion ?Ambulation ? ? ?Ambulation assist ? ? Ambulation activity did not occur:  (requires use of RW for safety when pt only intermittently used rollator at home) ? ?Assist level: Contact Guard/Touching assist ?Assistive device: Walker-rolling ?Max distance: 176f  ? ?Walk 10 feet activity ? ? ?Assist ?   ? ?Assist level: Contact Guard/Touching assist ?Assistive device: Walker-rolling  ? ?Walk 50 feet activity ? ? ?Assist   ? ?Assist level: Contact Guard/Touching assist ?Assistive device: Walker-rolling  ? ? ?  Walk 150 feet activity ? ? ?Assist Walk 150 feet activity did not occur: Safety/medical concerns ? ?Assist level: Contact Guard/Touching assist ?Assistive device: Walker-rolling ?  ? ?Walk 10 feet on uneven surface  ?activity ? ? ?Assist Walk 10 feet on uneven surfaces activity did not occur: Safety/medical concerns ? ? ?Assist level: Contact Guard/Touching assist ?Assistive device: Walker-rolling  ? ?Wheelchair ? ? ? ? ?Assist Is the patient using a wheelchair?: No ?Type of  Wheelchair: Manual ?  ? ?Wheelchair assist level: Supervision/Verbal cueing ?Max wheelchair distance: 25  ? ? ?Wheelchair 50 feet with 2 turns activity ? ? ? ?Assist ? ?  ?  ? ? ?Assist Level: Dependent - Patient 0%  ? ?Wheelchair 150 feet activity  ? ? ? ?Assist ?   ? ? ?Assist Level: Dependent - Patient 0%  ? ? ?Medical Problem List and Plan: ?1. 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  Antithrombotics: ?-DVT/anticoagulation:  Pharmaceutical: Lovenox ?            -antiplatelet therapy: DAPT X 3 weeks followed by Plavix ?3. Pain Management: tylenol prn ?4. Mood: Lcsw to follow for evaluation and support ?            -antipsychotic agents: N/A ?5. Neuropsych: This patient is  capable of making decisions on his own behalf. ?6. Skin/Wound Care: Routine pressure relief measures. ?7. Fluids/Electrolytes/Nutrition: Monitor I/Os.  ?8. HTN: Permissive HTN with slow titration of BP meds, continue amlodipine ?  ?Vitals:  ? 08/29/21 1918 08/30/21 0517  ?BP: (!) 164/85 (!) 161/75  ?Pulse: 71 61  ?Resp: 16 16  ?Temp: 98.4 ?F (36.9 ?C) 98.9 ?F (37.2 ?C)  ?SpO2: 97% 98%  ?-Increased amlodipine to '10mg'$  on 3/6, may take several days to take effect , f/u PCP in 1-2 wk  ? ? ?9. CKD: Avoid nephrotoxic meds and hypotensive episodes ?            --baseline SCr-1.95 to 2.08.  ?  ?BMP Latest Ref Rng & Units 08/29/2021 08/22/2021 08/20/2021  ?Glucose 70 - 99 mg/dL 117(H) 155(H) 126(H)  ?BUN 8 - 23 mg/dL 30(H) 34(H) 38(H)  ?Creatinine 0.61 - 1.24 mg/dL 2.10(H) 2.04(H) 2.12(H)  ?Sodium 135 - 145 mmol/L 141 140 139  ?Potassium 3.5 - 5.1 mmol/L 4.1 3.8 3.8  ?Chloride 98 - 111 mmol/L 108 106 107  ?CO2 22 - 32 mmol/L '27 26 25  '$ ?Calcium 8.9 - 10.3 mg/dL 8.8(L) 8.6(L) 9.0  ? 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 Nocturia: Gets up every 2 hours at nights. Has seen urology in the past. Continue flomax 0.'4mg'$  HS.  ?            --will use condom  cath at nights to help with sleep.  ?12. Parkinson's Disease: continue Sinemet 25-'100mg'$  2 tablets 4 times per day.   ?13. Insomnia: continue trazodone prn HS ? 14.  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      ?Added thigh hi hose  ?3/4 improved ?15.  Hx rectal Ca LLQ colostomy-pt has been managing this at home  ? ? ?LOS: 11 days ?A FACE TO FACE EVALUATION WAS PERFORMED ? ?Luanna Salk Hideo Googe ?08/30/2021, 8:17 AM ? ?

## 2021-08-30 NOTE — Progress Notes (Signed)
Inpatient Rehabilitation Care Coordinator ?Discharge Note  ? ?Patient Details  ?Name: Justin Mann ?MRN: [id number] ?Date of Birth: Jan 19, 1946 ? ? ?Discharge location: Home ? ?Length of Stay: 11 Days ? ?Discharge activity level: Sup/Mod I ? ?Home/community participation: spouse ? ?Patient response NI:OEVOJJ Literacy - How often do you need to have someone help you when you read instructions, pamphlets, or other written material from your doctor or pharmacy?: Rarely ? ?Patient response KK:XFGHWE Isolation - How often do you feel lonely or isolated from those around you?: Never ? ?Services provided included: SW, Pharmacy, TR, CM, RN, SLP, OT, PT, RD, MD ? ?Financial Services:  ?Charity fundraiser Utilized: Private Insurance ?VA/HTA ? ?Choices offered to/list presented to: patient ? ?Follow-up services arranged:  ?Outpatient ?   ?Outpatient Servicies: OP at [facility] ?  ?  ? ?Patient response to transportation need: ?Is the patient able to respond to transportation needs?: Yes ?In the past 12 months, has lack of transportation kept you from medical appointments or from getting medications?: No ?In the past 12 months, has lack of transportation kept you from meetings, work, or from getting things needed for daily living?: No ? ? ? ?Comments (or additional information): ? ?Patient/Family verbalized understanding of follow-up arrangements:  Yes ? ?Individual responsible for coordination of the follow-up plan: Patient / Justin Mann [phone] ? ?Confirmed correct DME delivered: Justin Mann 08/30/2021   ? ?Justin Mann ?

## 2021-08-30 NOTE — Discharge Summary (Signed)
Physician Discharge Summary  ?Patient ID: ?Justin Mann ?MRN: [id number] ?DOB/AGE: 1946/06/01 76 y.o. ? ?Admit date: 08/19/2021 ?Discharge date: 08/30/2021 ? ?Discharge Diagnoses:  ?Active Problems: ?  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  Stage 3 chronic kidney disease ([place]) ?  Essential hypertension ?  Parkinson disease ([place]) ?  BPH (benign prostatic hyperplasia) ? ? ?Discharged Condition: stable ? ?Significant Diagnostic Studies: N/A ? ? ?Labs:  ?Basic Metabolic Panel: ?BMP Latest Ref Rng & Units 08/29/2021 08/22/2021 08/20/2021  ?Glucose 70 - 99 mg/dL 117(H) 155(H) 126(H)  ?BUN 8 - 23 mg/dL 30(H) 34(H) 38(H)  ?Creatinine 0.61 - 1.24 mg/dL 2.10(H) 2.04(H) 2.12(H)  ?Sodium 135 - 145 mmol/L 141 140 139  ?Potassium 3.5 - 5.1 mmol/L 4.1 3.8 3.8  ?Chloride 98 - 111 mmol/L 108 106 107  ?CO2 22 - 32 mmol/L 27 26 25   ?Calcium 8.9 - 10.3 mg/dL 8.8(L) 8.6(L) 9.0  ?  ? ?CBC: ?CBC Latest Ref Rng & Units 08/29/2021 08/22/2021 08/20/2021  ?WBC 4.0 - 10.5 K/uL 6.7 7.1 7.0  ?Hemoglobin 13.0 - 17.0 g/dL 10.2(L) 10.6(L) 10.3(L)  ?Hematocrit 39.0 - 52.0 % 31.3(L) 31.3(L) 31.5(L)  ?Platelets 150 - 400 K/uL 207 203 206  ?  ? ?CBG: ?No results for input(s): GLUCAP in the last 168 hours. ? ?Brief HPI:   Justin Mann is a 76 y.o. male with history of HTN, PVD, CKD, rectal cancer status post resection with colostomy who was admitted on 08/16/2020 with right-sided weakness and difficulty walking.  MRI/MRA brain done revealing small acute infarcts in left parietal corona radiata and subcortical white matter.  Neurology was consulted for input and recommended DAPT x3 weeks followed by Plavix alone.  Therapy evaluations were completed showing functional decline and CIR was recommended for follow-up therapy ? ? ?Hospital Course: Justin Mann was admitted to rehab 08/19/2021 for inpatient therapies to consist of PT and OT at least three hours five days a week. Past admission physiatrist, therapy team and rehab RN have worked together to provide customized  collaborative inpatient rehab.  He was maintained on DAPT during the stay and advised to discontinue aspirin after 5 days.  His blood pressures were monitored on TID basis and amlodipine was titrated upwards to 10 mg for better control.  ? ?He did have orthostatic episode and Flomax was decreased to 0.4 mg/hs. He is voiding without difficulty on current regimen. PD symptoms have been managed on home dose Sinemet IR. Labs at admission showed evidence of acute on chronic renal failure and he was encouraged to push p.o. fluids.  Repeat labs prior to discharge show improvement.  Follow-up CBC shows H&H and platelets to be relatively stable.  He made steady gains during his stay and currently requires supervision.  We will continue to receive follow-up home health PT and OT at [facility] outpatient rehab after discharge ? ? ?Rehab course: During patient's stay in rehab weekly team conferences were held to monitor patient's progress, set goals and discuss barriers to discharge. At admission, patient required min assist with basic ADL tasks and mod assist with mobility.  Cognitive linguistic function was at baseline therefore ST not needed during the stay. He  has had improvement in activity tolerance, balance, postural control as well as ability to compensate for deficits. He has had improvement in functional use RUE  and RLE as well as improvement in awareness.  He is able to complete ADL tasks with supervision.  He requires supervision for transfers and CGA to ambulate 75-100 feet with  rolling walker.  Family education was completed with wife. ? ?Disposition:  Home ? ?Diet: Heart healthy ? ?Special Instructions: ?No alcohol or driving till cleared by MD. ?Recommend repeat be met in 1 to 2 weeks to monitor hydration status. ? ?Discharge Instructions   ? ? Ambulatory referral to Physical Medicine Rehab   Complete by: As directed ?  ? Hospital follow up  ? ?  ? ?Allergies as of 08/30/2021   ? ?   Reactions  ? Penicillins Hives   ? Erythromycin Rash  ? ?  ? ?  ?Medication List  ?  ? ?STOP taking these medications   ? ?lisinopril 20 MG tablet ?Commonly known as: ZESTRIL ?  ? ?  ? ?TAKE these medications   ? ?acetaminophen 325 MG tablet ?Commonly known as: TYLENOL ?Take 1-2 tablets (325-650 mg total) by mouth every 4 (four) hours as needed for mild pain. ?  ?amLODipine 5 MG tablet ?Commonly known as: NORVASC ?Take 2 tablets (10 mg total) by mouth daily. ?What changed:  ?medication strength ?how much to take ?  ?aspirin 81 MG EC tablet ?Take 1 tablet (81 mg total) by mouth daily. Swallow whole--take for 9 more days then stop ?What changed: additional instructions ?  ?atorvastatin 80 MG tablet ?Commonly known as: LIPITOR ?Take 1 tablet (80 mg total) by mouth daily. ?  ?carbidopa-levodopa 25-100 MG tablet ?Commonly known as: SINEMET IR ?Take 2 tablets by mouth in the morning, at noon, in the evening, and at bedtime. 0500, 1000, 1400, Bedtime ?  ?cholecalciferol 25 MCG (1000 UNIT) tablet ?Commonly known as: VITAMIN D3 ?Take 1,000 Units by mouth daily. ?  ?clopidogrel 75 MG tablet ?Commonly known as: PLAVIX ?Take 1 tablet (75 mg total) by mouth daily. ?  ?finasteride 5 MG tablet ?Commonly known as: PROSCAR ?Take 5 mg by mouth daily. ?  ?loratadine 10 MG tablet ?Commonly known as: CLARITIN ?Take 1 tablet (10 mg total) by mouth daily. ?  ?polyethylene glycol 17 g packet ?Commonly known as: MIRALAX / GLYCOLAX ?Take 17 g by mouth daily. ?  ?tamsulosin 0.4 MG Caps capsule ?Commonly known as: FLOMAX ?Take 0.8 mg by mouth at bedtime. ?  ?VITAMIN B-12 PO ?Take 1 tablet by mouth daily. ?  ? ?  ? ? Follow-up Information   ? ? Kirsteins, Luanna Salk, MD Follow up.   ?Specialty: Physical Medicine and Rehabilitation ?Why: office will call you with follow up appointment ?Contact information: ?[address] ?[phone] ?[place] [address] ?[phone] ? ? ?  ?  ? ? [facility] [employer]. Call.   ?Specialty: General Practice ?Why: for post hospital follow  up ?Contact information: ?[address] ?[place] [address] ?[phone] ? ? ?  ?  ? ? Vladimir Crofts, MD. Call.   ?Specialty: Neurology ?Why: for stroke follow up ?Contact information: ?[place] ?[facility] ?[place] [address] ?[phone] ? ? ?  ?  ? ?  ?  ? ?  ? ? ?Signed: ?Bary Leriche ?08/31/2021, 4:07 PM ?  ?

## 2021-08-30 NOTE — Progress Notes (Signed)
INPATIENT REHABILITATION DISCHARGE NOTE ? ? ?Discharge instructions by:PAM PA ? ?Verbalized understanding: yes ? ?Skin care/Wound care healing?none ? ?Pain:none ? ?IV's:none ? ?Tubes/Drains:colostomy self managed ? ?O2:none ? ?Safety instructions:done ? ?Patient belongings:done ? ?Discharged LN:[id number] ? ?Discharged JJH:[id number] ? ?Notes: wife in room  ? ? ? ?Justin Mann RNC,BSN, WTA  ? ? ?  ?

## 2021-08-31 DIAGNOSIS — G20A1 Parkinson's disease without dyskinesia, without mention of fluctuations: Secondary | ICD-10-CM

## 2021-08-31 DIAGNOSIS — N400 Enlarged prostate without lower urinary tract symptoms: Secondary | ICD-10-CM

## 2021-08-31 DIAGNOSIS — G20 Parkinson's disease: Secondary | ICD-10-CM

## 2021-08-31 NOTE — Progress Notes (Signed)
Called wife to clarify flomax dose as 0.4 mg/hs (decrease to one pill at bedtime) to avoid orthostatic changes.  ?

## 2021-09-06 ENCOUNTER — Encounter: Payer: Self-pay | Admitting: Occupational Therapy

## 2021-09-06 ENCOUNTER — Other Ambulatory Visit: Payer: Self-pay

## 2021-09-06 ENCOUNTER — Ambulatory Visit: Payer: PPO

## 2021-09-06 ENCOUNTER — Ambulatory Visit: Payer: PPO | Attending: Physician Assistant | Admitting: Occupational Therapy

## 2021-09-06 DIAGNOSIS — R2689 Other abnormalities of gait and mobility: Secondary | ICD-10-CM | POA: Insufficient documentation

## 2021-09-06 DIAGNOSIS — R2681 Unsteadiness on feet: Secondary | ICD-10-CM | POA: Insufficient documentation

## 2021-09-06 DIAGNOSIS — R262 Difficulty in walking, not elsewhere classified: Secondary | ICD-10-CM | POA: Diagnosis present

## 2021-09-06 DIAGNOSIS — G8929 Other chronic pain: Secondary | ICD-10-CM | POA: Diagnosis present

## 2021-09-06 DIAGNOSIS — M6281 Muscle weakness (generalized): Secondary | ICD-10-CM | POA: Diagnosis present

## 2021-09-06 DIAGNOSIS — I63033 Cerebral infarction due to thrombosis of bilateral carotid arteries: Secondary | ICD-10-CM | POA: Diagnosis present

## 2021-09-06 DIAGNOSIS — R278 Other lack of coordination: Secondary | ICD-10-CM | POA: Diagnosis present

## 2021-09-06 DIAGNOSIS — M25511 Pain in right shoulder: Secondary | ICD-10-CM | POA: Diagnosis present

## 2021-09-06 NOTE — Therapy (Signed)
[facility] ?[place], [place], [address] ?Phone: [phone]   Fax:  [phone] ? ?Physical Therapy Evaluation ? ?Patient Details  ?Name: Justin Mann ?MRN: [id number] ?Date of Birth: February 09, 1946 ?Referring Provider (PT): Angiulli, Quillian Quince ? ? ?Encounter Date: 09/06/2021 ? ? PT End of Session - 09/06/21 1202   ? ? Visit Number 1   ? Number of Visits 24   ? Date for PT Re-Evaluation 11/29/21   ? Authorization Type 1/10 eval 09/06/21   ? PT Start Time 1100   ? PT Stop Time 1147   ? PT Time Calculation (min) 47 min   ? Equipment Utilized During Treatment Gait belt   ? Activity Tolerance Patient limited by fatigue;Patient tolerated treatment well   ? Behavior During Therapy [facility] for tasks assessed/performed   ? ?  ?  ? ?  ? ? ?Past Medical History:  ?Diagnosis Date  ? Agent orange exposure   ? Diabetes mellitus without complication ([place])   ? Hypertension   ? Neuropathy   ? Rectal cancer ([place])   ? ? ?Past Surgical History:  ?Procedure Laterality Date  ? ILEOSTOMY    ? TONSILLECTOMY    ? ? ?There were no vitals filed for this visit. ? ? ? Subjective Assessment - 09/06/21 1104   ? ? Subjective Patient presents to PT for CVA.   ? Patient is accompained by: Family member   ? Pertinent History Patient presents to PT for CVA. He presented to the ED after weakness on 08/15/21 causing him to fall; upon hospital discharge he was then admitted to inpatient rehab on 08/19/21 and discharged 08/30/21. PMH includes HTN, PVD, CKD, Parkinsonism, rectal cancer s/p resection with colostomy. Patient lives in a one story home with spouse and was independent with ADLs prior to hospitalization. Utilizes a rollator for mobility at baseline at night. His RLE was primarily affected.   ? Limitations Lifting;Standing;Walking;House hold activities   ? How long can you sit comfortably? n/a   ? How long can you stand comfortably? limited   ? How long can you walk comfortably? 10 minutes with  AD   ? Patient Stated Goals get RLE stronger, emprove endurance   ? Currently in Pain? Yes   ? Pain Score 4    ? Pain Location Shoulder   ? Pain Orientation Right   ? Pain Descriptors / Indicators Aching   ? Pain Type Chronic pain   ? Pain Onset More than a month ago   ? Pain Frequency Intermittent   ? ?  ?  ? ?  ? ? ? [facility] PT Assessment - 09/06/21 1106   ? ?  ? Assessment  ? Medical Diagnosis CVA   ? Referring Provider (PT) Lauraine Rinne   ? Onset Date/Surgical Date 08/15/21   ? Hand Dominance Right   ? Prior Therapy CIR   ?  ? Precautions  ? Precautions Fall   ?  ? Restrictions  ? Weight Bearing Restrictions No   ?  ? Balance Screen  ? Has the patient fallen in the past 6 months Yes   ? How many times? 4   ? Has the patient had a decrease in activity level because of a fear of falling?  Yes   ? Is the patient reluctant to leave their home because of a fear of falling?  Yes   ?  ? Home Environment  ? Living Environment Private residence   ?  Living Arrangements Spouse/significant other   ? Type of Home House   ? Home Access Stairs to enter   ? Entrance Stairs-Number of Steps 4   ?  ? Prior Function  ? Level of Independence Independent with basic ADLs   ? Vocation Retired   ? Vocation Requirements former grocery store in [place]   ? Leisure likes to collect trains   ?  ? Observation/Other Assessments  ? Focus on Therapeutic Outcomes (FOTO)  52   ?  ? Standardized Balance Assessment  ? Standardized Balance Assessment Berg Balance Test   ?  ? Berg Balance Test  ? Sit to Stand Able to stand  independently using hands   ? Standing Unsupported Able to stand 30 seconds unsupported   ? Sitting with Back Unsupported but Feet Supported on Floor or Stool Able to sit safely and securely 2 minutes   ? Stand to Sit Uses backs of legs against chair to control descent   ? Transfers Able to transfer safely, definite need of hands   ? Standing Unsupported with Eyes Closed Able to stand 3 seconds   ? Standing Unsupported with Feet  Together Needs help to attain position and unable to hold for 15 seconds   ? From Standing, Reach Forward with Outstretched Arm Reaches forward but needs supervision   ? From Standing Position, Pick up Object from Floor Unable to pick up and needs supervision   ? From Standing Position, Turn to Look Behind Over each Shoulder Needs supervision when turning   ? Turn 360 Degrees Needs assistance while turning   ? Standing Unsupported, Alternately Place Feet on Step/Stool Needs assistance to keep from falling or unable to try   ? Standing Unsupported, One Foot in Front Needs help to step but can hold 15 seconds   ? Standing on One Leg Unable to try or needs assist to prevent fall   ? Total Score 20   ? ?  ?  ? ?  ? ? ? ? ? ? PAIN: ?Chronic shoulder pain ?Bilateral knee pain ?R groin pain .  ? ?POSTURE: ?Excessive forward trunk lean and rounded shoulders seated ?Standing: excessive forward trunk lean and rounded shoulders with weight shift onto LLE.  ? ?PROM/AROM: ?Limited hip extension and trunk extension bilaterally  ? ?STRENGTH:  Graded on a 0-5 scale ?Muscle Group Left Right  ?Hip Flex 4/5 3+/5  ?Hip Abd 4/5 3+/5  ?Hip Add 4/5 3/5  ?Hip Ext 4/5 2+/5  ?Knee Flex 4/5 3+/5  ?Knee Ext 4/5 3+/5  ?Ankle DF 3+/5 2+/5  ?Ankle PF 3+/5 2+/5  ? ?SENSATION:  ?BUE :  ?BLE :  ? ?NEUROLOGICAL SCREEN: (2+ unless otherwise noted.) N=normal  Ab=abnormal ?  ?Level Dermatome R L  ?C3 Anterior Neck ? N N  ?C4 Top of Shoulder N N  ?C5 Lateral Upper Arm ? N N  ?C6 Lateral Arm/ Thumb ? N N  ?C7 Middle Finger ? N N  ?C8 4th & 5th Finger N N  ?T1 Medial Arm N N  ?L2 Medial thigh/groin N N  ?L3 Lower thigh/med.knee N N  ?L4 Medial leg/lat thigh N N  ?L5 Lat. leg & dorsal foot N N  ?S1 post/lat foot/thigh/leg N N  ?S2 Post./med. thigh & leg N N  ?  ?SOMATOSENSORY:  ?Any N & T in extremities or weakness: reports : ?  ?      Sensation           Intact  Diminished         Absent  ?Light touch LEs     ?                          ?COORDINATION: ?Finger to Nose: Dysmetric with R >L ? ?        Heel Shin Slide Test: decreased with RLE with past pointing.  ? ? ?SPECIAL TESTS: ?Cranial Nerves/Vision  ?Has decreased tracking to the R following pen ? ?FUNCTIONAL MOBILITY: ?STS: requires heavy BUE support ? ?BALANCE: ?Static Sitting Balance  ?Normal Able to maintain balance against maximal resistance   ?Good Able to maintain balance against moderate resistance x  ?Good-/Fair+ Accepts minimal resistance   ?Fair Able to sit unsupported without balance loss and without UE support   ?Poor+ Able to maintain with Minimal assistance from individual or chair   ?Poor Unable to maintain balance-requires mod/max support from individual or chair   ? ?Static Standing Balance  ?Normal Able to maintain standing balance against maximal resistance   ?Good Able to maintain standing balance against moderate resistance   ?Good-/Fair+ Able to maintain standing balance against minimal resistance   ?Fair Able to stand unsupported without UE support and without LOB for 1-2 min x  ?Fair- Requires Min A and UE support to maintain standing without loss of balance   ?Poor+ Requires mod A and UE support to maintain standing without loss of balance   ?Poor Requires max A and UE support to maintain standing balance without loss   ? ?Dynamic Sitting Balance  ?Normal Able to sit unsupported and weight shift across midline maximally   ?Good Able to sit unsupported and weight shift across midline moderately   ?Good-/Fair+ Able to sit unsupported and weight shift across midline minimally x  ?Fair Minimal weight shifting ipsilateral/front, difficulty crossing midline   ?Fair- Reach to ipsilateral side and unable to weight shift   ?Poor + Able to sit unsupported with min A and reach to ipsilateral side, unable to weight shift   ?Poor Able to sit unsupported with mod A and reach ipsilateral/front-can?t cross midline   ? ?Standing Dynamic Balance  ?Normal Stand independently unsupported,  able to weight shift and cross midline maximally   ?Good Stand independently unsupported, able to weight shift and cross midline moderately   ?Good-/Fair+ Stand independently unsupported, able to weight shift across mi

## 2021-09-06 NOTE — Patient Instructions (Signed)
?  Access Code: [email or web address] ?URL: [email or web address] ?Date: 09/06/2021 ?Prepared by: Janna Arch ? ?Exercises ?Seated Scapular Retraction - 1 x daily - 7 x weekly - 2 sets - 10 reps - 5 hold ?Seated Long Arc Quad - 1 x daily - 7 x weekly - 2 sets - 10 reps - 5 hold ?Seated Hip Abduction with Resistance - 1 x daily - 7 x weekly - 2 sets - 10 reps - 5 hold ?Seated March with Resistance - 1 x daily - 7 x weekly - 2 sets - 10 reps - 5 hold ?Seated Heel Toe Raises - 1 x daily - 7 x weekly - 2 sets - 10 reps - 5 hold ? ? ?

## 2021-09-08 ENCOUNTER — Other Ambulatory Visit: Payer: Self-pay

## 2021-09-08 ENCOUNTER — Ambulatory Visit: Payer: PPO | Admitting: Occupational Therapy

## 2021-09-08 ENCOUNTER — Ambulatory Visit: Payer: PPO

## 2021-09-08 DIAGNOSIS — M6281 Muscle weakness (generalized): Secondary | ICD-10-CM

## 2021-09-08 DIAGNOSIS — R2689 Other abnormalities of gait and mobility: Secondary | ICD-10-CM

## 2021-09-08 DIAGNOSIS — R262 Difficulty in walking, not elsewhere classified: Secondary | ICD-10-CM

## 2021-09-08 DIAGNOSIS — G8929 Other chronic pain: Secondary | ICD-10-CM

## 2021-09-08 DIAGNOSIS — R2681 Unsteadiness on feet: Secondary | ICD-10-CM

## 2021-09-08 DIAGNOSIS — R278 Other lack of coordination: Secondary | ICD-10-CM

## 2021-09-08 DIAGNOSIS — I63033 Cerebral infarction due to thrombosis of bilateral carotid arteries: Secondary | ICD-10-CM

## 2021-09-08 NOTE — Therapy (Signed)
[facility] ?[place], [place], [address] ?Phone: [phone]   Fax:  [phone] ? ?Physical Therapy Treatment ? ?Patient Details  ?Name: Justin Mann ?MRN: [id number] ?Date of Birth: 1945/07/05 ?Referring Provider (PT): Angiulli, Quillian Quince ? ? ?Encounter Date: 09/08/2021 ? ? PT End of Session - 09/08/21 1141   ? ? Visit Number 2   ? Number of Visits 24   ? Date for PT Re-Evaluation 11/29/21   ? Authorization Type 1/10 eval 09/06/21   ? PT Start Time 1015   ? PT Stop Time 1058   ? PT Time Calculation (min) 43 min   ? Equipment Utilized During Treatment Gait belt   ? Activity Tolerance Patient limited by fatigue;Patient tolerated treatment well   ? Behavior During Therapy [facility] for tasks assessed/performed   ? ?  ?  ? ?  ? ? ?Past Medical History:  ?Diagnosis Date  ? Agent orange exposure   ? Diabetes mellitus without complication ([place])   ? Hypertension   ? Neuropathy   ? Rectal cancer ([place])   ? ? ?Past Surgical History:  ?Procedure Laterality Date  ? ILEOSTOMY    ? TONSILLECTOMY    ? ? ?There were no vitals filed for this visit. ? ? Subjective Assessment - 09/08/21 1020   ? ? Subjective Patient reports no new issues- Reports he was some sore from performing his new HEP.   ? Patient is accompained by: Family member   ? Pertinent History Patient presents to PT for CVA. He presented to the ED after weakness on 08/15/21 causing him to fall; upon hospital discharge he was then admitted to inpatient rehab on 08/19/21 and discharged 08/30/21. PMH includes HTN, PVD, CKD, Parkinsonism, rectal cancer s/p resection with colostomy. Patient lives in a one story home with spouse and was independent with ADLs prior to hospitalization. Utilizes a rollator for mobility at baseline at night. His RLE was primarily affected.   ? Limitations Lifting;Standing;Walking;House hold activities   ? How long can you sit comfortably? n/a   ? How long can you stand comfortably? limited   ?  How long can you walk comfortably? 10 minutes with AD   ? Patient Stated Goals get RLE stronger, emprove endurance   ? Pain Onset More than a month ago   ? ?  ?  ? ?  ? ? ? ? ? ? ?INTERVENTIONS: ? ? ?Neuro re-ed:  ? ?Heel to Toe gait sequencing - using 2 cones for placement and RTB x 15 reps.  ? ?Lateral step tap on left (facilitate weight to right LE)  x 12 ?Lateral Step up on left x 10 (increased fatigue after 7 reps)  ? ?Side step up/over orange hurdle x 12 reps- Mild difficulty with coordinating and patient with increased overall fatigue.  ? ?Seated knee flex - with GTB x 12 reps BLE.  ? ?Sit to stand  with min BUE support x 5 - stopped secondary to undo fatigue.  ? ?Seated hip flex 3lb AW x 10 reps ?Seated knee ext 3lb AW x 10 reps ? ?*Patient required 1 restroom break during middle of session and several quick rest break between exercises due to fatigue.  ? ? ?Education provided throughout session via VC/TC and demonstration to facilitate movement at target joints and correct muscle activation for all testing and exercises performed.  ? ? ? ? ? ? ? ? ? ? ? ? ? PT Education - 09/09/21  1141   ? ? Education Details Exercise technique   ? Person(s) Educated Patient   ? Methods Explanation;Demonstration;Tactile cues;Verbal cues   ? Comprehension Verbalized understanding;Returned demonstration;Verbal cues required;Tactile cues required;Need further instruction   ? ?  ?  ? ?  ? ? ? PT Short Term Goals - 09/06/21 1212   ? ?  ? PT SHORT TERM GOAL #1  ? Title Patient will be independent in home exercise program to improve strength/mobility for better functional independence with ADLs.   ? Baseline 3/14: HEP given   ? Time 4   ? Period Weeks   ? Status New   ? Target Date 10/04/21   ? ?  ?  ? ?  ? ? ? ? PT Long Term Goals - 09/06/21 1212   ? ?  ? PT LONG TERM GOAL #1  ? Title Patient will increase FOTO score to equal to or greater than  67%   to demonstrate statistically significant improvement in mobility and quality of  life.   ? Baseline 3/14: 52%   ? Time 12   ? Period Weeks   ? Status New   ? Target Date 11/29/21   ?  ? PT LONG TERM GOAL #2  ? Title Patient (> 74 years old) will complete five times sit to stand test in < 15 seconds without UE support indicating an increased LE strength and improved balance.   ? Baseline 3/14: 20.4 seconds with BUE support   ? Time 12   ? Period Weeks   ? Status New   ? Target Date 11/29/21   ?  ? PT LONG TERM GOAL #3  ? Title Patient will increase Berg Balance score by > 6 points (26/56) to demonstrate decreased fall risk during functional activities.   ? Baseline 3/14: 20/56   ? Time 12   ? Period Weeks   ? Status New   ? Target Date 11/29/21   ?  ? PT LONG TERM GOAL #4  ? Title Patient will increase 10 meter walk test to >1.27ms as to improve gait speed for better community ambulation and to reduce fall risk   ? Baseline 3/14: 0.36 m/s with RW   ? Time 12   ? Period Weeks   ? Status New   ? Target Date 11/29/21   ?  ? PT LONG TERM GOAL #5  ? Title Patient will increase BLE gross strength to 4+/5 as to improve functional strength for independent gait, increased standing tolerance and increased ADL ability   ? Baseline 3/14: see note   ? Time 12   ? Period Weeks   ? Status New   ? Target Date 11/29/21   ? ?  ?  ? ?  ? ? ? ? ? ? ? ? Plan - 09/08/21 1139   ? ? Clinical Impression Statement Patient presented with good motivation for today's session. He was able to participate fairly- trying all activities but limited mostly by fatigue and weakness today. He did better with advancing his right LE duing walking in // bars and later with walker. He did present with some difficulty with right LE proprioception today but improved with each task. Patient will benefit from skilled physical therapy to improve strength, mobility, and capacity for functional activity   ? Personal Factors and Comorbidities Age;Comorbidity 3+;Fitness;Past/Current Experience;Transportation   ? Comorbidities HTN, PVD, CKD,  Parkinsonism, rectal cancer s/p resection with colostomy   ? Examination-Activity Limitations Bathing;Bed Mobility;Bend;Caring for  Others;Carry;Dressing;Reach Overhead;Locomotion Level;Lift;Squat;Stairs;Stand;Toileting;Transfers   ? Examination-Participation Restrictions Cleaning;Community Activity;Meal Prep;Laundry;Shop;Volunteer;[place] [employer] Work   ? Stability/Clinical Decision Making Evolving/Moderate complexity   ? Rehab Potential Fair   ? PT Frequency 2x / week   ? PT Duration 12 weeks   ? PT Treatment/Interventions ADLs/Self Care Home Management;Canalith Repostioning;Cryotherapy;Electrical Stimulation;Moist Heat;Traction;Ultrasound;Contrast Bath;DME Instruction;Gait training;Stair training;Functional mobility training;Therapeutic activities;Therapeutic exercise;Balance training;Neuromuscular re-education;Patient/family education;Orthotic Fit/Training;Manual techniques;Passive range of motion;Vestibular;Taping;Energy conservation;Dry needling;Visual/perceptual remediation/compensation   ? PT Next Visit Plan strength, RLE stabilization, balance   ? PT Home Exercise Plan no changes this session   ? Consulted and Agree with Plan of Care Patient;Family member/caregiver   ? Family Member Consulted wife   ? ?  ?  ? ?  ? ? ?Patient will benefit from skilled therapeutic intervention in order to improve the following deficits and impairments:  Abnormal gait, Decreased activity tolerance, Cardiopulmonary status limiting activity, Decreased balance, Decreased knowledge of precautions, Decreased endurance, Decreased coordination, Decreased knowledge of use of DME, Decreased mobility, Difficulty walking, Decreased strength, Impaired flexibility, Impaired perceived functional ability, Impaired tone, Impaired UE functional use, Postural dysfunction, Improper body mechanics, Pain ? ?Visit Diagnosis: ?Other abnormalities of gait and mobility ? ?Unsteadiness on feet ? ?Muscle weakness (generalized) ? 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 ?Difficulty in walking, not elsewhere classified ? ? ? ? ?Problem List ?Patient Active Problem List  ? Diagnosis Date Noted  ? Parkinson disease ([place]) 0

## 2021-09-13 ENCOUNTER — Encounter: Payer: Self-pay | Admitting: Physical Therapy

## 2021-09-13 ENCOUNTER — Ambulatory Visit: Payer: PPO | Admitting: Occupational Therapy

## 2021-09-13 ENCOUNTER — Ambulatory Visit: Payer: PPO | Admitting: Physical Therapy

## 2021-09-13 ENCOUNTER — Other Ambulatory Visit: Payer: Self-pay

## 2021-09-13 DIAGNOSIS — M6281 Muscle weakness (generalized): Secondary | ICD-10-CM | POA: Diagnosis not present

## 2021-09-13 DIAGNOSIS — R278 Other lack of coordination: Secondary | ICD-10-CM

## 2021-09-13 DIAGNOSIS — R2681 Unsteadiness on feet: Secondary | ICD-10-CM

## 2021-09-13 DIAGNOSIS — R262 Difficulty in walking, not elsewhere classified: Secondary | ICD-10-CM

## 2021-09-13 DIAGNOSIS — R2689 Other abnormalities of gait and mobility: Secondary | ICD-10-CM

## 2021-09-13 NOTE — Therapy (Signed)
[facility] ?[place], [place], [address] ?Phone: [phone]   Fax:  [phone] ? ?Occupational Therapy Evaluation ? ?Patient Details  ?Name: Justin Mann ?MRN: [id number] ?Date of Birth: Jun 12, 1946 ?No data recorded ? ?Encounter Date: 09/06/2021 ? ? OT End of Session - 09/13/21 0910   ? ? Visit Number 1   ? Number of Visits 24   ? Date for OT Re-Evaluation 11/29/21   ? OT Start Time 1000   ? OT Stop Time 1059   ? OT Time Calculation (min) 59 min   ? Activity Tolerance Patient tolerated treatment well   ? Behavior During Therapy [facility] for tasks assessed/performed   ? ?  ?  ? ?  ? ? ?Past Medical History:  ?Diagnosis Date  ? Agent orange exposure   ? Diabetes mellitus without complication ([place])   ? Hypertension   ? Neuropathy   ? Rectal cancer ([place])   ? ? ?Past Surgical History:  ?Procedure Laterality Date  ? ILEOSTOMY    ? TONSILLECTOMY    ? ? ?There were no vitals filed for this visit. ? ? Subjective Assessment - 09/12/21 0854   ? ? Subjective  Pt reports he got up in the night to go to the bathroom, was using a rollator and fell down after dragging his foot, fell and couldn't get up.  Was transported by EMS to [facility] and then he went to [facility] inpatient rehab 10 days.   ? Patient is accompanied by: Family member   ? Patient Stated Goals Pt reports he would like to get back to normal and not relying on wife for everything.   ? Currently in Pain? Yes   ? Pain Score 4    ? Pain Location Shoulder   ? Pain Orientation Right   ? Pain Descriptors / Indicators Aching   ? Pain Type Chronic pain   ? Pain Onset More than a month ago   ? ?  ?  ? ?  ? ? ? ? [facility] OT Assessment - 09/12/21 0854   ? ?  ? Assessment  ? Medical Diagnosis CVA   ? Onset Date/Surgical Date 08/15/21   ? Hand Dominance Right   ? Prior Therapy CIR   ?  ? Precautions  ? Precautions Fall   ?  ? Restrictions  ? Weight Bearing Restrictions No   ?  ? Balance Screen  ? Has the patient fallen  in the past 6 months Yes   ? How many times? 4 or more   ?  ? Home  Environment  ? Family/patient expects to be discharged to: Private residence   ? Living Arrangements Spouse/significant other   ? Available Help at Discharge Family   ? Type of Home House   ? Home Access Stairs   ? Home Layout One level   ? Bathroom Shower/Tub Walk-in Shower;Curtain   ? Bathroom Toilet Standard   ? Bathroom Accessibility No   ? Home Equipment Walker - 2 wheels;Walker - 4 wheels;Tub bench;Grab bars - tub/shower;Hand held shower head   ? Lives With Spouse   ?  ? Prior Function  ? Level of Independence Independent with basic ADLs   ? Vocation Retired   ? Vocation Requirements former grocery store in [place]   ? Leisure likes to collect trains   ?  ? ADL  ? Eating/Feeding Modified independent   ? Grooming Modified independent   ? Upper  Body Bathing Supervision/safety   ? Lower Body Bathing Minimal assistance   ? Upper Body Dressing Minimal assistance   ? Lower Body Dressing Increased time   ? Toilet Transfer Supervision/safety   ? Tub/Shower Transfer Supervision/safety   ? 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  Pt fatigues easily with all tasks even short duration acts, increased need for rest breaks for all tasks, handwriting demonstrates poor legibility, micrographia.  Pt reports tinnitus in right ear and has difficulty focusing at times.  He would like to be able to prepare and get his food on his own, decrease dependency on the walker for ambulation in the home, be able to carry items from room to room.  He has 2 cats which he has been the primary caregiver for feeding, opening food and packages, managing bowls and reaching but is unable to perform these tasks currently.  He requires assistance with all homemaking tasks and yardwork.  He is able to prepare light snacks and breakfast.  He has neuropathy in bilateral feet.   ?  ? IADL  ? Prior Level of Function Shopping independent   ? Shopping Needs to be  accompanied on any shopping trip   ? Prior Level of Function Light Housekeeping independent   ? Light Housekeeping Needs help with all home maintenance tasks   ? Prior Level of Function Meal Prep independent   ? Meal Prep Able to complete simple warm meal prep   ? Prior Level of Function Community Mobility did not drive prior to CVA   ? Community Mobility Relies on family or friends for transportation   ? Prior Level of Function Medication Managment independent   ? Medication Management Is responsible for taking medication in correct dosages at correct time   ? Prior Level of Function Financial Management independent   ?  ? Mobility  ? Mobility Status History of falls   ?  ? Written Expression  ? Dominant Hand Right   ?  ? Vision - History  ? Additional Comments denies any changes with vision but appears to demonstrate decreased tracking to the right   ?  ? Cognition  ? Overall Cognitive Status Cognition to be further assessed in functional context PRN   ? Cognition Comments Pt and wife denies any changes   ?  ? Observation/Other Assessments  ? Focus on Therapeutic Outcomes (FOTO)  66   ?  ? Sensation  ? Light Touch Appears Intact   ? Hot/Cold Appears Intact   ?  ? Coordination  ? Finger Nose Finger Test dysmetric bilaterally   ? Right 9 Hole Peg Test 34 sec   ? Left 9 Hole Peg Test 34 sec   ? Tremors tremors present at times bilaterally, worse on right than left.   ?  ? AROM  ? Overall AROM  Deficits   ? Overall AROM Comments bilateral shoulder flexion to 110 degrees, ABD 140 degrees with pain on right, elbow, wrist and hand WNL for motion.  Full opposition in bilateral hands   ?  ? Strength  ? Overall Strength Deficits   ? Overall Strength Comments Pt reports previous injury to right arm in the past from snow shoveling.  Strength right UE 3-/5 overall, left 4/5 overall   ?  ? Hand Function  ? Right Hand Grip (lbs) 45   ? Left Hand Grip (lbs) 60   ? ?  ?  ? ?  ? ? ? ? ? ? ? ? ? ? ? ? ? ? ? ? ? ? ?  OT Education -  09/13/21 0910   ? ? Education Details Role of OT, goals and plan of care   ? Person(s) Educated Patient;Spouse   ? Methods Explanation   ? Comprehension Verbalized understanding   ? ?  ?  ? ?  ? ? ? ? ? ? OT Long Term Goals - 09/13/21 0915   ? ?  ? OT LONG TERM GOAL #1  ? Title Pt will demonstrate independence with home exercise program   ? Baseline no current program   ? Time 12   ? Period Weeks   ? Status New   ? Target Date 11/29/21   ?  ? OT LONG TERM GOAL #2  ? Title Patient will improve active range of motion of right shoulder for flexion by 15 degrees to be able to reach in the closet to access clothing with modified independence.   ? Baseline Difficulty with reaching for items with right upper extremity   ? Time 12   ? Period Weeks   ? Status New   ? Target Date 11/29/21   ?  ? OT LONG TERM GOAL #3  ? Title Patient will improve right grip strength by 10 pounds to be able to grasp and lift items to move from 1 surface to another with modified independence.   ? Baseline Grip strength on the right 45 pounds   ? Time 12   ? Period Weeks   ? Status New   ? Target Date 11/29/21   ?  ? OT LONG TERM GOAL #4  ? Title Patient to demonstrate activity tolerance of 15 minutes with no rest breaks during ADL/IADL tasks   ? Baseline Patient fatigues easily and requires frequent rest breaks at eval   ? Time 6   ? Period Weeks   ? Status New   ? Target Date 10/18/21   ?  ? OT LONG TERM GOAL #5  ? Title Patient will improve Foto score to 79 or better to show a clinical relevant change in function to impact his level of independence with ADL and IADL tasks   ? Baseline Eval: 66   ? Time 12   ? Period Weeks   ? Status New   ? Target Date 11/29/21   ?  ? Long Term Additional Goals  ? Additional Long Term Goals Yes   ?  ? OT LONG TERM GOAL #6  ? Title Patient will demonstrate ability to feed cats with modified independence including opening containers/packages and placing into bowl.   ? Baseline Eval requires assistance   ? Time  12   ? Period Weeks   ? Status New   ? Target Date 11/29/21   ?  ? OT LONG TERM GOAL #7  ? Title Patient will complete basic self-care tasks with modified independence   ? Baseline At eval requires occasional su

## 2021-09-13 NOTE — Therapy (Signed)
[facility] ?[place], [place], [address] ?Phone: [phone]   Fax:  [phone] ? ?Physical Therapy Treatment ? ?Patient Details  ?Name: Justin Mann ?MRN: [id number] ?Date of Birth: August 12, 1945 ?Referring Provider (PT): Angiulli, Quillian Quince ? ? ?Encounter Date: 09/13/2021 ? ? PT End of Session - 09/13/21 1100   ? ? Visit Number 3   ? Number of Visits 24   ? Date for PT Re-Evaluation 11/29/21   ? Authorization Type 1/10 eval 09/06/21   ? PT Start Time 1101   ? PT Stop Time 1145   ? PT Time Calculation (min) 44 min   ? Equipment Utilized During Treatment Gait belt   ? Activity Tolerance Patient limited by fatigue;Patient tolerated treatment well   ? Behavior During Therapy [facility] for tasks assessed/performed   ? ?  ?  ? ?  ? ? ?Past Medical History:  ?Diagnosis Date  ? Agent orange exposure   ? Diabetes mellitus without complication ([place])   ? Hypertension   ? Neuropathy   ? Rectal cancer ([place])   ? ? ?Past Surgical History:  ?Procedure Laterality Date  ? ILEOSTOMY    ? TONSILLECTOMY    ? ? ?There were no vitals filed for this visit. ? ? Subjective Assessment - 09/13/21 1104   ? ? Subjective Patient reports doing ok. He reports increased fatigue today. He reports doing exercises every other day- "I do ok. I get stiff." He reports 1 new fall/stumble, about 2 days ago. He was backing up from feeding the cat and fell down. he was able to get up;   ? Patient is accompained by: Family member   ? Pertinent History Patient presents to PT for CVA. He presented to the ED after weakness on 08/15/21 causing him to fall; upon hospital discharge he was then admitted to inpatient rehab on 08/19/21 and discharged 08/30/21. PMH includes HTN, PVD, CKD, Parkinsonism, rectal cancer s/p resection with colostomy. Patient lives in a one story home with spouse and was independent with ADLs prior to hospitalization. Utilizes a rollator for mobility at baseline at night. His RLE was  primarily affected.   ? Limitations Lifting;Standing;Walking;House hold activities   ? How long can you sit comfortably? n/a   ? How long can you stand comfortably? limited   ? How long can you walk comfortably? 10 minutes with AD   ? Patient Stated Goals get RLE stronger, emprove endurance   ? Currently in Pain? Yes   ? Pain Score 5    ? Pain Location Shoulder   ? Pain Orientation Right   ? Pain Descriptors / Indicators Aching;Sore   ? Pain Type Chronic pain   ? Pain Onset More than a month ago   ? Pain Frequency Constant   ? Aggravating Factors  movement makes it worse- always present;   ? Pain Relieving Factors hasn't tried heat, pain meds help   ? Effect of Pain on Daily Activities decreased activity tolerance;   ? Multiple Pain Sites No   ? ?  ?  ? ?  ? ? ? ? ? ? ? ? ?INTERVENTIONS: ?  ? ?Exercise:  ?Seated: ?Seated hip flex/ABD, lifting LE over 1/2 bolster, 3lb AW x 10 reps, he reports increased challenge on RLE;  ?Seated knee ext 3lb AW x 10 reps with cues for erect posture and ankle DF ?Seated knee flex - with GTB x 12 reps BLE.  ? ?Standing by steps: ?  3# ankle weights on LE: ?BLE heel/toe raises x12 reps with cues to avoid hip movement and isolate ankle ROM ?-hip abduction SLR x10 reps each LE with cues to avoid hip ER to improve hip abductor activation ?-alternate toe taps to 6 inch step for hip flexion x10 reps each LE  ?Patient reports moderate fatigue after standing exercise, Spo2 100%, HR 75 ? ? ? ?Neuro re-ed:  ?  ?Lateral step tap on left (facilitate weight to right LE) over orange hurdle with 2 UE assist (hesitant to reduce down to 1 UE assist), CGA for safety  x 10, moderate difficulty reported;  ? ?  ?Forward/backward step over orange hurdle with 1 rail assist x5 reps with moderate to heavy difficulty reported;  ?Side step up/over orange hurdle x 5 reps- Mild difficulty with coordinating and patient with increased overall fatigue.  ?  ?  ?Standing on airex pad: ?-feet apart, unsupported standing  x30 sec ?Progressed to staggered stance, 15 sec hold x1 rep each foot in front, patient does exhibit increased difficulty with RLE ahead of LLE requiring CGA for safety;  ?  ?Patient required several seated rest breaks during session due to fatigue;  ?  ?Alternated between standing/seated exercise to help alleviate fatigue;  ?  ?Education provided throughout session via VC/TC and demonstration to facilitate movement at target joints and correct muscle activation for all testing and exercises performed.  ?  ? ? ? ? ? ? ? ? ? ? ? ? ? ? ? ? ? ? ? ? PT Education - 09/13/21 1100   ? ? Education Details exercise technique;   ? Person(s) Educated Patient   ? Methods Explanation;Verbal cues   ? Comprehension Verbalized understanding;Returned demonstration;Verbal cues required;Need further instruction   ? ?  ?  ? ?  ? ? ? PT Short Term Goals - 09/06/21 1212   ? ?  ? PT SHORT TERM GOAL #1  ? Title Patient will be independent in home exercise program to improve strength/mobility for better functional independence with ADLs.   ? Baseline 3/14: HEP given   ? Time 4   ? Period Weeks   ? Status New   ? Target Date 10/04/21   ? ?  ?  ? ?  ? ? ? ? PT Long Term Goals - 09/06/21 1212   ? ?  ? PT LONG TERM GOAL #1  ? Title Patient will increase FOTO score to equal to or greater than  67%   to demonstrate statistically significant improvement in mobility and quality of life.   ? Baseline 3/14: 52%   ? Time 12   ? Period Weeks   ? Status New   ? Target Date 11/29/21   ?  ? PT LONG TERM GOAL #2  ? Title Patient (> 74 years old) will complete five times sit to stand test in < 15 seconds without UE support indicating an increased LE strength and improved balance.   ? Baseline 3/14: 20.4 seconds with BUE support   ? Time 12   ? Period Weeks   ? Status New   ? Target Date 11/29/21   ?  ? PT LONG TERM GOAL #3  ? Title Patient will increase Berg Balance score by > 6 points (26/56) to demonstrate decreased fall risk during functional activities.    ? Baseline 3/14: 20/56   ? Time 12   ? Period Weeks   ? Status New   ? Target Date 11/29/21   ?  ?  PT LONG TERM GOAL #4  ? Title Patient will increase 10 meter walk test to >1.56ms as to improve gait speed for better community ambulation and to reduce fall risk   ? Baseline 3/14: 0.36 m/s with RW   ? Time 12   ? Period Weeks   ? Status New   ? Target Date 11/29/21   ?  ? PT LONG TERM GOAL #5  ? Title Patient will increase BLE gross strength to 4+/5 as to improve functional strength for independent gait, increased standing tolerance and increased ADL ability   ? Baseline 3/14: see note   ? Time 12   ? Period Weeks   ? Status New   ? Target Date 11/29/21   ? ?  ?  ? ?  ? ? ? ? ? ? ? ? Plan - 09/13/21 1124   ? ? Clinical Impression Statement Patient motivated and participated well within session. He does report increased fatigue today. PT instructed patient in standing and seated exercise, alternating to help reduce fatigue and improve exercise tolerance. He does require 1-2 UE assist in standing for better balance control. Utilized ankle weight and resistance band for LE strengthening. he does require min VCs for proper exercise technique including to improve erect posture and LE positioning for better strengthening. He exhibits increased weakness on RLE reporting increased difficulty with exercise.  Patient would benefit from additional skilled PT intervention to improve strength, balance and mobility;   ? Personal Factors and Comorbidities Age;Comorbidity 3+;Fitness;Past/Current Experience;Transportation   ? Comorbidities HTN, PVD, CKD, Parkinsonism, rectal cancer s/p resection with colostomy   ? Examination-Activity Limitations Bathing;Bed Mobility;Bend;Caring for O[employer]heed MartinLocomotion Level;Lift;Squat;Stairs;Stand;Toileting;Transfers   ? Examination-Participation Restrictions Cleaning;Community Activity;Meal Prep;Laundry;Shop;Volunteer;[employer] LeaverWork   ? Stability/Clinical Decision Making  Evolving/Moderate complexity   ? Rehab Potential Fair   ? PT Frequency 2x / week   ? PT Duration 12 weeks   ? PT Treatment/Interventions ADLs/Self Care Home Management;Canalith Repostioning;Cryotherapy;Electr

## 2021-09-16 ENCOUNTER — Ambulatory Visit: Payer: PPO | Admitting: Physical Therapy

## 2021-09-16 ENCOUNTER — Encounter: Payer: Self-pay | Admitting: Occupational Therapy

## 2021-09-16 ENCOUNTER — Other Ambulatory Visit: Payer: Self-pay

## 2021-09-16 ENCOUNTER — Ambulatory Visit: Payer: PPO | Admitting: Occupational Therapy

## 2021-09-16 DIAGNOSIS — R2689 Other abnormalities of gait and mobility: Secondary | ICD-10-CM

## 2021-09-16 DIAGNOSIS — R278 Other lack of coordination: Secondary | ICD-10-CM

## 2021-09-16 DIAGNOSIS — M6281 Muscle weakness (generalized): Secondary | ICD-10-CM

## 2021-09-16 DIAGNOSIS — R262 Difficulty in walking, not elsewhere classified: Secondary | ICD-10-CM

## 2021-09-16 DIAGNOSIS — R2681 Unsteadiness on feet: Secondary | ICD-10-CM

## 2021-09-16 NOTE — Therapy (Signed)
[facility] ?[place], [place], [address] ?Phone: [phone]   Fax:  [phone] ? ?Occupational Therapy Treatment ? ?Patient Details  ?Name: Justin Mann ?MRN: [id number] ?Date of Birth: 10/30/1945 ?No data recorded ? ?Encounter Date: 09/08/2021 ? ? OT End of Session - 09/16/21 1420   ? ? Visit Number 2   ? Number of Visits 24   ? Date for OT Re-Evaluation 11/29/21   ? OT Start Time 1100   ? OT Stop Time 1145   ? OT Time Calculation (min) 45 min   ? Activity Tolerance Patient tolerated treatment well   ? Behavior During Therapy [facility] for tasks assessed/performed   ? ?  ?  ? ?  ? ? ? ?Past Medical History:  ?Diagnosis Date  ? Agent orange exposure   ? Diabetes mellitus without complication ([place])   ? Hypertension   ? Neuropathy   ? Rectal cancer ([place])   ? ? ?Past Surgical History:  ?Procedure Laterality Date  ? ILEOSTOMY    ? TONSILLECTOMY    ? ? ?There were no vitals filed for this visit. ? ? Subjective Assessment - 09/16/21 1419   ? ? Subjective  Pt reports pain in bilateral LEs, 5/10   ? Pertinent History Pt. presented to the ED after weakness on 08/15/21 causing him to fall; upon hospital discharge he was then admitted to inpatient rehab on 08/19/21 and discharged 08/30/21. PMH includes HTN, PVD, CKD, Parkinsonism, rectal cancer s/p resection with colostomy. Patient lives in a one story home with spouse and was independent with ADLs prior to hospitalization. Utilizes a rollator for mobility at baseline at night.   ? Patient Stated Goals Pt reports he would like to get back to normal and not relying on wife for everything.   ? Currently in Pain? Yes   ? Pain Score 5    ? Pain Location Leg   ? Pain Orientation Right;Left   ? Pain Descriptors / Indicators Aching   ? Pain Type Chronic pain   ? Pain Onset More than a month ago   ? Pain Frequency Constant   ? ?  ?  ? ?  ? ?5/10 bilateral lower extremities, pt also has history of right shoulder pain from past  injury.   ? ?Therapeutic Exercise: ?Pt seen this date for ROM and strengthening with use of 2# weighted dowel, shoulder flexion, ABD, ADD, chest press, circles both forward and backwards for 10 reps for 2 sets with therapist demonstration and cues for proper form and technique.   ?Grip strength with hand gripper setting on 17# for 20 reps for 2 sets with short rest breaks as needed.   ? ?Neuromuscular Reeducation: ?Pt seen for manipulation of playing cards for shuffling, dealing and flipping with thumb/finger combinations with increased verbal cues.  Performed Card scatter pile to pick up and place cards in ascending order by each suit.  Cues for thumb and finger combinations.   ? ?Response to tx: ?Pt responds well to cues with exercises for proper form and technique.  Able to perform shoulder exercises with no complaints of increased pain.  Grip strength performed on 3rd setting with right hand, able to complete 2 sets, demonstrated increased hand fatigue during reps and benefits from short rest breaks.  Continue to work towards goals in plan of care to improve RUE strength, ROM and improved functional use for daily tasks.  ? ? ? ? ? ? ? ? ? ? ? ? ? ? ? ? ? ? ? ? ?  OT Education - 09/16/21 1419   ? ? Education Details UE ROM/strengthening, coordination   ? Person(s) Educated Patient;Spouse   ? Methods Explanation;Demonstration;Verbal cues   ? Comprehension Verbalized understanding;Returned demonstration;Verbal cues required   ? ?  ?  ? ?  ? ? ? ? ? ? OT Long Term Goals - 09/13/21 0915   ? ?  ? OT LONG TERM GOAL #1  ? Title Pt will demonstrate independence with home exercise program   ? Baseline no current program   ? Time 12   ? Period Weeks   ? Status New   ? Target Date 11/29/21   ?  ? OT LONG TERM GOAL #2  ? Title Patient will improve active range of motion of right shoulder for flexion by 15 degrees to be able to reach in the closet to access clothing with modified independence.   ? Baseline Difficulty with  reaching for items with right upper extremity   ? Time 12   ? Period Weeks   ? Status New   ? Target Date 11/29/21   ?  ? OT LONG TERM GOAL #3  ? Title Patient will improve right grip strength by 10 pounds to be able to grasp and lift items to move from 1 surface to another with modified independence.   ? Baseline Grip strength on the right 45 pounds   ? Time 12   ? Period Weeks   ? Status New   ? Target Date 11/29/21   ?  ? OT LONG TERM GOAL #4  ? Title Patient to demonstrate activity tolerance of 15 minutes with no rest breaks during ADL/IADL tasks   ? Baseline Patient fatigues easily and requires frequent rest breaks at eval   ? Time 6   ? Period Weeks   ? Status New   ? Target Date 10/18/21   ?  ? OT LONG TERM GOAL #5  ? Title Patient will improve Foto score to 79 or better to show a clinical relevant change in function to impact his level of independence with ADL and IADL tasks   ? Baseline Eval: 66   ? Time 12   ? Period Weeks   ? Status New   ? Target Date 11/29/21   ?  ? Long Term Additional Goals  ? Additional Long Term Goals Yes   ?  ? OT LONG TERM GOAL #6  ? Title Patient will demonstrate ability to feed cats with modified independence including opening containers/packages and placing into bowl.   ? Baseline Eval requires assistance   ? Time 12   ? Period Weeks   ? Status New   ? Target Date 11/29/21   ?  ? OT LONG TERM GOAL #7  ? Title Patient will complete basic self-care tasks with modified independence   ? Baseline At eval requires occasional supervision to min assist   ? Time 6   ? Period Weeks   ? Status New   ? Target Date 10/18/21   ?  ? OT LONG TERM GOAL #8  ? Title Patient will demonstrate the ability to print and sign his name with 90% or better legibility.   ? Baseline At eval decreased legibility, micrographia   ? Time 12   ? Period Weeks   ? Status New   ? Target Date 11/29/21   ? ?  ?  ? ?  ? ? ? ? ? ? ? ? Plan - 09/16/21 1420   ? ?  Clinical Impression Statement Pt responds well to cues  with exercises for proper form and technique.  Able to perform shoulder exercises with no complaints of increased pain.  Grip strength performed on 3rd setting with right hand, able to complete 2 sets, demonstrated increased hand fatigue during reps and benefits from short rest breaks.  Continue to work towards goals in plan of care to improve RUE strength, ROM and improved functional use for daily tasks.   ? OT Occupational Profile and History Detailed Assessment- Review of Records and additional review of physical, cognitive, psychosocial history related to current functional performance   ? Occupational performance deficits (Please refer to evaluation for details): ADL's;IADL's;Leisure   ? Body Structure / Function / Physical Skills ADL;Coordination;Endurance;GMC;UE functional use;Balance;Decreased knowledge of use of DME;Flexibility;IADL;Pain;Vision;Dexterity;FMC;Strength;Gait;Mobility;ROM   ? Psychosocial Skills Habits;Environmental  Adaptations;Routines and Behaviors   ? Rehab Potential Good   ? Clinical Decision Making Several treatment options, min-mod task modification necessary   ? Comorbidities Affecting Occupational Performance: May have comorbidities impacting occupational performance   ? Modification or Assistance to Complete Evaluation  No modification of tasks or assist necessary to complete eval   ? OT Frequency 2x / week   ? OT Duration 12 weeks   ? OT Treatment/Interventions Self-care/ADL training;Cryotherapy;Paraffin;Therapeutic exercise;DME and/or AE instruction;Functional Mobility Training;Cognitive remediation/compensation;Balance training;Neuromuscular education;Manual Therapy;Visual/perceptual remediation/compensation;Psychosocial skills training;Moist Heat;Contrast Bath;Energy conservation;Passive range of motion;Therapeutic activities;Patient/family education;Coping strategies training   ? Consulted and Agree with Plan of Care Patient;Family member/caregiver   ? Family Member Consulted  wife, [name]   ? ?  ?  ? ?  ? ? ? ?Patient will benefit from skilled therapeutic intervention in order to improve the following deficits and impairments:   ?Body Structure / Function / Physical Skills: ADL, Coord

## 2021-09-16 NOTE — Therapy (Signed)
[facility] ?[place], [place], [address] ?Phone: [phone]   Fax:  [phone] ? ?Physical Therapy Treatment ? ?Patient Details  ?Name: Justin Mann ?MRN: [id number] ?Date of Birth: 12/02/1945 ?Referring Provider (PT): Angiulli, Quillian Quince ? ? ?Encounter Date: 09/16/2021 ? ? PT End of Session - 09/16/21 0950   ? ? Visit Number 4   ? Number of Visits 24   ? Date for PT Re-Evaluation 11/29/21   ? Authorization Type 1/10 eval 09/06/21   ? PT Start Time [id number]   ? PT Stop Time 1035   ? PT Time Calculation (min) 41 min   ? Equipment Utilized During Treatment Gait belt   ? Activity Tolerance Patient limited by fatigue;Patient tolerated treatment well   ? Behavior During Therapy [facility] for tasks assessed/performed   ? ?  ?  ? ?  ? ? ?Past Medical History:  ?Diagnosis Date  ? Agent orange exposure   ? Diabetes mellitus without complication ([place])   ? Hypertension   ? Neuropathy   ? Rectal cancer ([place])   ? ? ?Past Surgical History:  ?Procedure Laterality Date  ? ILEOSTOMY    ? TONSILLECTOMY    ? ? ?There were no vitals filed for this visit. ? ? Subjective Assessment - 09/16/21 0949   ? ? Subjective Pt reports no falls or LOb since last session. Pt report ssome shcoulder pain but it is chronic in nature. No other changes since last session.   ? Patient is accompained by: Family member   ? Pertinent History Patient presents to PT for CVA. He presented to the ED after weakness on 08/15/21 causing him to fall; upon hospital discharge he was then admitted to inpatient rehab on 08/19/21 and discharged 08/30/21. PMH includes HTN, PVD, CKD, Parkinsonism, rectal cancer s/p resection with colostomy. Patient lives in a one story home with spouse and was independent with ADLs prior to hospitalization. Utilizes a rollator for mobility at baseline at night. His RLE was primarily affected.   ? Limitations Lifting;Standing;Walking;House hold activities   ? How long can you sit  comfortably? n/a   ? How long can you stand comfortably? limited   ? How long can you walk comfortably? 10 minutes with AD   ? Patient Stated Goals get RLE stronger, emprove endurance   ? Currently in Pain? Yes   ? Pain Score 5    ? Pain Location Shoulder   ? Pain Orientation Right   ? Pain Descriptors / Indicators Aching   ? Pain Type Chronic pain   ? Pain Onset More than a month ago   ? ?  ?  ? ?  ? ? ?INTERVENTIONS: ?  ? ? ? ?Exercise/Activity Sets/ Reps/Time/ Resistance Assistance Charge type Comments  ?Lateral step taps  X 10 ea  CGA/ U UE  NMR  Unable to complete without UE support, fatigue noted following   ?Semitandem on airex  1*30 sec ea  CGA-MinA throughout for balance.  Neuro re-ed 6RPE  ?Marching standing 10 x each B UE  Therex  Rest between sets, decreased amplitude of movement with fatigue   ?Anterior step taps 5 x ea  U UE  NMR Difficulty with foot cleartance bilaterally, particularly with increased reps/ fatigue   ?Heel/toe rocks standing  X 10 normal ?X 10 1.2 foam  B UE  Therex Unable to compete in functional ROM, improved efficacy in seated, recommend progressing in seated with weight added prior  to continuing in standing position.   ?SLS progression  1 x 30 sec ea  U UE with R LE on floor, CGA/ Min A for balance recovery throughout  Neuro re-ed 6RPE  ?Seated heel to toe rocks on 1/2 Foam 2 x 12   NMR  Good ROM, limited fatigue with this exercise, to improve balance recovery ant/ post and to activate the targeted muscled for this task.   ?Standing hamstring curls  2*10  Therex  5RPE  ?Nustep  Level 1` x 5 min   therex Cues for cadence, unable to maintain cadence > 50 SPM despite cues, reports as 5 RPE   ?      ?      ?Treatment provided this session ? ? ?Pt educated throughout session about proper posture and technique with exercises. Improved exercise technique, movement at target joints, use of target muscles after min to mod verbal, visual, tactile cues. ?Note: Portions of this document were  prepared using Dragon voice recognition software and although reviewed may contain unintentional dictation errors in syntax, grammar, or spelling. ? ?Pt required occasional rest breaks due fatigue, PT was quick to ask when pt appeared to be fatiguing in order to prevent excessive fatigue. ? ? ? ? ? ? ? ? ? ? ? ? ? ? ? ? ? ? ? ? ? ? ? ? ? ? ? ? ? PT Education - 09/16/21 0949   ? ? Education Details exercise form and technique   ? Person(s) Educated Patient   ? Methods Explanation;Demonstration   ? Comprehension Verbalized understanding;Returned demonstration   ? ?  ?  ? ?  ? ? ? PT Short Term Goals - 09/06/21 1212   ? ?  ? PT SHORT TERM GOAL #1  ? Title Patient will be independent in home exercise program to improve strength/mobility for better functional independence with ADLs.   ? Baseline 3/14: HEP given   ? Time 4   ? Period Weeks   ? Status New   ? Target Date 10/04/21   ? ?  ?  ? ?  ? ? ? ? PT Long Term Goals - 09/06/21 1212   ? ?  ? PT LONG TERM GOAL #1  ? Title Patient will increase FOTO score to equal to or greater than  67%   to demonstrate statistically significant improvement in mobility and quality of life.   ? Baseline 3/14: 52%   ? Time 12   ? Period Weeks   ? Status New   ? Target Date 11/29/21   ?  ? PT LONG TERM GOAL #2  ? Title Patient (> 41 years old) will complete five times sit to stand test in < 15 seconds without UE support indicating an increased LE strength and improved balance.   ? Baseline 3/14: 20.4 seconds with BUE support   ? Time 12   ? Period Weeks   ? Status New   ? Target Date 11/29/21   ?  ? PT LONG TERM GOAL #3  ? Title Patient will increase Berg Balance score by > 6 points (26/56) to demonstrate decreased fall risk during functional activities.   ? Baseline 3/14: 20/56   ? Time 12   ? Period Weeks   ? Status New   ? Target Date 11/29/21   ?  ? PT LONG TERM GOAL #4  ? Title Patient will increase 10 meter walk test to >1.48ms as to improve gait speed for better  community ambulation  and to reduce fall risk   ? Baseline 3/14: 0.36 m/s with RW   ? Time 12   ? Period Weeks   ? Status New   ? Target Date 11/29/21   ?  ? PT LONG TERM GOAL #5  ? Title Patient will increase BLE gross strength to 4+/5 as to improve functional strength for independent gait, increased standing tolerance and increased ADL ability   ? Baseline 3/14: see note   ? Time 12   ? Period Weeks   ? Status New   ? Target Date 11/29/21   ? ?  ?  ? ?  ? ? ? ? ? ? ? ? Plan - 09/16/21 0950   ? ? Clinical Impression Statement Patient motivated participated well within session.  Patient did have fatigue throughout session and required frequent rest breaks as a result.  Patient demonstrates significant signs of imbalance when limiting base of support or standing on unsteady surfaces.  Patient to demonstrate progress with progressing to more standing exercises compared to seated exercises this session with fatigue was present with all standing exercises.  Patient was unable to perform adequate heel raises in sitting position either with half foam roller or without patient was better able to tolerate these in seated with better available range of motion and will benefit from progression of this and seated with weight prior to performance and standing.  Patient will continue to benefit from skilled physical therapy intervention in order to improve lower extremity strength, balance, and mobility.   ? Personal Factors and Comorbidities Age;Comorbidity 3+;Fitness;Past/Current Experience;Transportation   ? Comorbidities HTN, PVD, CKD, Parkinsonism, rectal cancer s/p resection with colostomy   ? Examination-Activity Limitations Bathing;Bed Mobility;Bend;Caring for [employer];Locomotion Level;Lift;Squat;Stairs;Stand;Toileting;Transfers   ? Examination-Participation Restrictions Cleaning;Community Activity;Meal Prep;Laundry;Shop;Volunteer;[place] [employer] Work   ? Stability/Clinical Decision Making Evolving/Moderate complexity   ?  Rehab Potential Fair   ? PT Frequency 2x / week   ? PT Duration 12 weeks   ? PT Treatment/Interventions ADLs/Self Care Home Management;Canalith Repostioning;Cryotherapy;Electrical Stimulation;Moist Heat;Traction;U

## 2021-09-17 ENCOUNTER — Encounter: Payer: Self-pay | Admitting: Occupational Therapy

## 2021-09-17 NOTE — Therapy (Signed)
[place] [facility] ?[place], [place], [address] ?Phone: [phone]   Fax:  [phone] ? ?Occupational Therapy Treatment ? ?Patient Details  ?Name: Justin Mann ?MRN: [id number] ?Date of Birth: 01/08/1946 ?No data recorded ? ?Encounter Date: 09/13/2021 ? ? OT End of Session - 09/16/21 1454   ? ? Visit Number 3   ? Number of Visits 24   ? Date for OT Re-Evaluation 11/29/21   ? OT Start Time 1015   ? OT Stop Time 1059   ? OT Time Calculation (min) 44 min   ? Activity Tolerance Patient tolerated treatment well   ? Behavior During Therapy [facility] for tasks assessed/performed   ? ?  ?  ? ?  ? ? ?Past Medical History:  ?Diagnosis Date  ? Agent orange exposure   ? Diabetes mellitus without complication ([place])   ? Hypertension   ? Neuropathy   ? Rectal cancer ([place])   ? ? ?Past Surgical History:  ?Procedure Laterality Date  ? ILEOSTOMY    ? TONSILLECTOMY    ? ? ?There were no vitals filed for this visit. ? ? Subjective Assessment - 09/17/21 1452   ? ? Subjective  Pt reports he didn't sleep well last night, pain in right shoulder 6/10   ? Pertinent History Pt. presented to the ED after weakness on 08/15/21 causing him to fall; upon hospital discharge he was then admitted to inpatient rehab on 08/19/21 and discharged 08/30/21. PMH includes HTN, PVD, CKD, Parkinsonism, rectal cancer s/p resection with colostomy. Patient lives in a one story home with spouse and was independent with ADLs prior to hospitalization. Utilizes a rollator for mobility at baseline at night.   ? Patient Stated Goals Pt reports he would like to get back to normal and not relying on wife for everything.   ? Currently in Pain? Yes   ? Pain Score 6    ? Pain Location Shoulder   ? Pain Orientation Right   ? Pain Descriptors / Indicators Aching   ? Pain Type Chronic pain   ? Pain Onset More than a month ago   ? Pain Frequency Constant   ? ?  ?  ? ?  ? ? ?Pt reports he didn't sleep well last night, pain in  right shoulder 6/10 ?Therapeutic Exercises: ? ?Green putty for grip, lateral pinch, 3 point and 2 point pinches, therapist demonstration and cues for proper form and technique. ? ? ?Neuromuscular reeducation: ?Pt seen for placing and removing jumbo pegs in judy board, turning and flipping from one side to other side, end to end.  Cues for prehension patterns. ?Medication bottles with small sized simulated medium and small sized pills to pick up and manipulate to place back into bottle. Translatory skills of the hand and using hand for storage with cues.   ? ?Response to tx: ?Pt responds well to cues for putty exercises and able to perform green resistive level of pinch.  Pt demonstrates tremors at times with coordination activities.  Dropping items frequently and requires assist to retrieve items from floor.  Pt able to demonstrate translatory skills of the hand and using hand for storage with mild to moderate difficulty.  Will continue to work towards improving strength, motion and coordination of right UE for use in ADLs and IADLs.   ? ? ? ? ? ? ? ? ? ? ? ? ? ? ? ? ? ? ? ? OT Education - 09/17/21  1454   ? ? Education Details UE ROM/strengthening, coordination   ? Person(s) Educated Patient;Spouse   ? Methods Explanation;Demonstration;Verbal cues   ? Comprehension Verbalized understanding;Returned demonstration;Verbal cues required   ? ?  ?  ? ?  ? ? ? ? ? ? OT Long Term Goals - 09/13/21 0915   ? ?  ? OT LONG TERM GOAL #1  ? Title Pt will demonstrate independence with home exercise program   ? Baseline no current program   ? Time 12   ? Period Weeks   ? Status New   ? Target Date 11/29/21   ?  ? OT LONG TERM GOAL #2  ? Title Patient will improve active range of motion of right shoulder for flexion by 15 degrees to be able to reach in the closet to access clothing with modified independence.   ? Baseline Difficulty with reaching for items with right upper extremity   ? Time 12   ? Period Weeks   ? Status New   ?  Target Date 11/29/21   ?  ? OT LONG TERM GOAL #3  ? Title Patient will improve right grip strength by 10 pounds to be able to grasp and lift items to move from 1 surface to another with modified independence.   ? Baseline Grip strength on the right 45 pounds   ? Time 12   ? Period Weeks   ? Status New   ? Target Date 11/29/21   ?  ? OT LONG TERM GOAL #4  ? Title Patient to demonstrate activity tolerance of 15 minutes with no rest breaks during ADL/IADL tasks   ? Baseline Patient fatigues easily and requires frequent rest breaks at eval   ? Time 6   ? Period Weeks   ? Status New   ? Target Date 10/18/21   ?  ? OT LONG TERM GOAL #5  ? Title Patient will improve Foto score to 79 or better to show a clinical relevant change in function to impact his level of independence with ADL and IADL tasks   ? Baseline Eval: 66   ? Time 12   ? Period Weeks   ? Status New   ? Target Date 11/29/21   ?  ? Long Term Additional Goals  ? Additional Long Term Goals Yes   ?  ? OT LONG TERM GOAL #6  ? Title Patient will demonstrate ability to feed cats with modified independence including opening containers/packages and placing into bowl.   ? Baseline Eval requires assistance   ? Time 12   ? Period Weeks   ? Status New   ? Target Date 11/29/21   ?  ? OT LONG TERM GOAL #7  ? Title Patient will complete basic self-care tasks with modified independence   ? Baseline At eval requires occasional supervision to min assist   ? Time 6   ? Period Weeks   ? Status New   ? Target Date 10/18/21   ?  ? OT LONG TERM GOAL #8  ? Title Patient will demonstrate the ability to print and sign his name with 90% or better legibility.   ? Baseline At eval decreased legibility, micrographia   ? Time 12   ? Period Weeks   ? Status New   ? Target Date 11/29/21   ? ?  ?  ? ?  ? ? ? ? ? ? ? ? Plan - 09/17/21 1454   ? ? Clinical Impression  Statement Pt responds well to cues for putty exercises and able to perform green resistive level of pinch.  Pt demonstrates tremors  at times with coordination activities.  Dropping items frequently and requires assist to retrieve items from floor.  Pt able to demonstrate translatory skills of the hand and using hand for storage with mild to moderate difficulty.  Will continue to work towards improving strength, motion and coordination of right UE for use in ADLs and IADLs.   ? OT Occupational Profile and History Detailed Assessment- Review of Records and additional review of physical, cognitive, psychosocial history related to current functional performance   ? Occupational performance deficits (Please refer to evaluation for details): ADL's;IADL's;Leisure   ? Body Structure / Function / Physical Skills ADL;Coordination;Endurance;GMC;UE functional use;Balance;Decreased knowledge of use of DME;Flexibility;IADL;Pain;Vision;Dexterity;FMC;Strength;Gait;Mobility;ROM   ? Psychosocial Skills Habits;Environmental  Adaptations;Routines and Behaviors   ? Rehab Potential Good   ? Clinical Decision Making Several treatment options, min-mod task modification necessary   ? Comorbidities Affecting Occupational Performance: May have comorbidities impacting occupational performance   ? Modification or Assistance to Complete Evaluation  No modification of tasks or assist necessary to complete eval   ? OT Frequency 2x / week   ? OT Duration 12 weeks   ? OT Treatment/Interventions Self-care/ADL training;Cryotherapy;Paraffin;Therapeutic exercise;DME and/or AE instruction;Functional Mobility Training;Cognitive remediation/compensation;Balance training;Neuromuscular education;Manual Therapy;Visual/perceptual remediation/compensation;Psychosocial skills training;Moist Heat;Contrast Bath;Energy conservation;Passive range of motion;Therapeutic activities;Patient/family education;Coping strategies training   ? Consulted and Agree with Plan of Care Patient;Family member/caregiver   ? Family Member Consulted wife, [name]   ? ?  ?  ? ?  ? ? ?Patient will benefit from skilled  therapeutic intervention in order to improve the following deficits and impairments:   ?Body Structure / Function / Physical Skills: ADL, Coordination, Endurance, GMC, UE functional use, Balance, Decreased

## 2021-09-17 NOTE — Therapy (Addendum)
[place] ?[facility] ?[place], [place], [address] ?Phone: [phone]   Fax:  [phone] ? ?Occupational Therapy Treatment ? ?Patient Details  ?Name: Justin Mann ?MRN: [id number] ?Date of Birth: 07/25/1945 ?No data recorded ? ?Encounter Date: 09/16/2021 ? ? OT End of Session - 09/17/21 1524   ? ? Visit Number 4   ? Number of Visits 24   ? Date for OT Re-Evaluation 11/29/21   ? OT Start Time 1100   ? OT Stop Time 1145   ? OT Time Calculation (min) 45 min   ? Activity Tolerance Patient tolerated treatment well   ? Behavior During Therapy [facility] for tasks assessed/performed   ? ?  ?  ? ?  ? ? ?Past Medical History:  ?Diagnosis Date  ? Agent orange exposure   ? Diabetes mellitus without complication ([place])   ? Hypertension   ? Neuropathy   ? Rectal cancer ([place])   ? ? ?Past Surgical History:  ?Procedure Laterality Date  ? ILEOSTOMY    ? TONSILLECTOMY    ? ? ?There were no vitals filed for this visit. ? ? Subjective Assessment - 09/17/21 1523   ? ? Pertinent History Pt. presented to the ED after weakness on 08/15/21 causing him to fall; upon hospital discharge he was then admitted to inpatient rehab on 08/19/21 and discharged 08/30/21. PMH includes HTN, PVD, CKD, Parkinsonism, rectal cancer s/p resection with colostomy. Patient lives in a one story home with spouse and was independent with ADLs prior to hospitalization. Utilizes a rollator for mobility at baseline at night.   ? Patient Stated Goals Pt reports he would like to get back to normal and not relying on wife for everything.   ? Currently in Pain? Yes   ? Pain Score 3    ? Pain Location Shoulder   ? Pain Orientation Right   ? Pain Descriptors / Indicators Aching   ? Pain Type Chronic pain   ? Pain Onset More than a month ago   ? Pain Frequency Constant   ? ?  ?  ? ?  ? ? ?Pt reports pain in right shoulder 5/10, achy this date.  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 ?Pt seen for ROM with use of looped  ball tower to move items from one level to the next, pt able to complete all 4 levels from seated position.  Cues for extended reach for 4th level with weight shifting and reaching with right UE.  ?Pt seen for Grip strengthening with use of resistive hand gripper places on 3rd setting, 17# of pressure for 20 reps for 2 sets each.   ?Advanced to 4th setting 23# of pressure for 10 reps ? ?7  hand exercises to help promote handwriting, dexterity and flexibility. ?1)  Fisting with arm then arm extension with fingers extended  ?2) oppositional movements of the thumb to each digit ?3) wrist flex/ext with elbows extended ?4) supination/pronation ?5) tendon gliding with hook fist and then finger extension ?6) tendon gliding with tabletop movement of fingers with MP flexion, PIP and DIP extension ?7) MP flexion, PIP flexion, DIP extension ?Performed with therapist demo and occasional cues for form ? ?Resistance pins in multi directions, black level of resistance is difficult at times with pinch.   ? ?Neuro Reed: ?Manipulation of small ? inch squares to pick up and place into small glass bottle.  Increased difficulty with smaller items.   ? ?  Response to tx: ?Pt able to advance this date to increased grip strength with resistive gripper to 23# pounds of pressure.  Pt responds well to cues for therapeutic exercises and also with coordination tasks.  Chronic shoulder pain continues to limit pts progression.  Difficulty with black level of resistive pinch but able to perform in session.  Continue to work towards goals in plan of care to improve ROM, strength and functional use in necessary daily tasks.  ? ? ? ? ? ? ? ? ? ? ? ? ? ? ? ? ? ? ? ? OT Education - 09/17/21 1523   ? ? Education Details UE ROM/strengthening, coordination   ? Person(s) Educated Patient;Spouse   ? Methods Explanation;Demonstration;Verbal cues   ? Comprehension Verbalized understanding;Returned demonstration;Verbal cues required   ? ?  ?  ? ?  ? ? ? ? ? ? OT  Long Term Goals - 09/13/21 0915   ? ?  ? OT LONG TERM GOAL #1  ? Title Pt will demonstrate independence with home exercise program   ? Baseline no current program   ? Time 12   ? Period Weeks   ? Status New   ? Target Date 11/29/21   ?  ? OT LONG TERM GOAL #2  ? Title Patient will improve active range of motion of right shoulder for flexion by 15 degrees to be able to reach in the closet to access clothing with modified independence.   ? Baseline Difficulty with reaching for items with right upper extremity   ? Time 12   ? Period Weeks   ? Status New   ? Target Date 11/29/21   ?  ? OT LONG TERM GOAL #3  ? Title Patient will improve right grip strength by 10 pounds to be able to grasp and lift items to move from 1 surface to another with modified independence.   ? Baseline Grip strength on the right 45 pounds   ? Time 12   ? Period Weeks   ? Status New   ? Target Date 11/29/21   ?  ? OT LONG TERM GOAL #4  ? Title Patient to demonstrate activity tolerance of 15 minutes with no rest breaks during ADL/IADL tasks   ? Baseline Patient fatigues easily and requires frequent rest breaks at eval   ? Time 6   ? Period Weeks   ? Status New   ? Target Date 10/18/21   ?  ? OT LONG TERM GOAL #5  ? Title Patient will improve Foto score to 79 or better to show a clinical relevant change in function to impact his level of independence with ADL and IADL tasks   ? Baseline Eval: 66   ? Time 12   ? Period Weeks   ? Status New   ? Target Date 11/29/21   ?  ? Long Term Additional Goals  ? Additional Long Term Goals Yes   ?  ? OT LONG TERM GOAL #6  ? Title Patient will demonstrate ability to feed cats with modified independence including opening containers/packages and placing into bowl.   ? Baseline Eval requires assistance   ? Time 12   ? Period Weeks   ? Status New   ? Target Date 11/29/21   ?  ? OT LONG TERM GOAL #7  ? Title Patient will complete basic self-care tasks with modified independence   ? Baseline At eval requires occasional  supervision to min assist   ?  Time 6   ? Period Weeks   ? Status New   ? Target Date 10/18/21   ?  ? OT LONG TERM GOAL #8  ? Title Patient will demonstrate the ability to print and sign his name with 90% or better legibility.   ? Baseline At eval decreased legibility, micrographia   ? Time 12   ? Period Weeks   ? Status New   ? Target Date 11/29/21   ? ?  ?  ? ?  ? ? ? ? ? ? ? ? Plan - 09/18/21 2034   ? ? Clinical Impression Statement Pt able to advance this date to increased grip strength with resistive gripper to 23# pounds of pressure.  Pt responds well to cues for therapeutic exercises and also with coordination tasks.  Chronic shoulder pain continues to limit pts progression.  Difficulty with black level of resistive pinch but able to perform in session.  Continue to work towards goals in plan of care to improve ROM, strength and functional use in necessary daily tasks.   ? OT Occupational Profile and History Detailed Assessment- Review of Records and additional review of physical, cognitive, psychosocial history related to current functional performance   ? Occupational performance deficits (Please refer to evaluation for details): ADL's;IADL's;Leisure   ? Body Structure / Function / Physical Skills ADL;Coordination;Endurance;GMC;UE functional use;Balance;Decreased knowledge of use of DME;Flexibility;IADL;Pain;Vision;Dexterity;FMC;Strength;Gait;Mobility;ROM   ? Psychosocial Skills Habits;Environmental  Adaptations;Routines and Behaviors   ? Rehab Potential Good   ? Clinical Decision Making Several treatment options, min-mod task modification necessary   ? Comorbidities Affecting Occupational Performance: May have comorbidities impacting occupational performance   ? Modification or Assistance to Complete Evaluation  No modification of tasks or assist necessary to complete eval   ? OT Frequency 2x / week   ? OT Duration 12 weeks   ? OT Treatment/Interventions Self-care/ADL  training;Cryotherapy;Paraffin;Therapeutic exercise;DME and/or AE instruction;Functional Mobility Training;Cognitive remediation/compensation;Balance training;Neuromuscular education;Manual Therapy;Visual/perceptual remediation/compensatio

## 2021-09-21 ENCOUNTER — Ambulatory Visit: Payer: PPO | Admitting: Physical Therapy

## 2021-09-21 ENCOUNTER — Other Ambulatory Visit: Payer: Self-pay

## 2021-09-21 ENCOUNTER — Ambulatory Visit: Payer: PPO | Admitting: Occupational Therapy

## 2021-09-21 ENCOUNTER — Encounter: Payer: Self-pay | Admitting: Physical Therapy

## 2021-09-21 DIAGNOSIS — R2681 Unsteadiness on feet: Secondary | ICD-10-CM

## 2021-09-21 DIAGNOSIS — M6281 Muscle weakness (generalized): Secondary | ICD-10-CM

## 2021-09-21 DIAGNOSIS — R2689 Other abnormalities of gait and mobility: Secondary | ICD-10-CM

## 2021-09-21 DIAGNOSIS — R278 Other lack of coordination: Secondary | ICD-10-CM

## 2021-09-21 DIAGNOSIS — G8929 Other chronic pain: Secondary | ICD-10-CM

## 2021-09-21 DIAGNOSIS — R262 Difficulty in walking, not elsewhere classified: Secondary | ICD-10-CM

## 2021-09-21 NOTE — Therapy (Signed)
[facility] ?[place], [place], [address] ?Phone: [phone]   Fax:  [phone] ? ?Physical Therapy Treatment ? ?Patient Details  ?Name: Justin Mann ?MRN: [id number] ?Date of Birth: Jan 09, 1946 ?Referring Provider (PT): Angiulli, Quillian Quince ? ? ?Encounter Date: 09/21/2021 ? ? PT End of Session - 09/21/21 0843   ? ? Visit Number 5   ? Number of Visits 24   ? Date for PT Re-Evaluation 11/29/21   ? Authorization Type 1/10 eval 09/06/21   ? PT Start Time 0845   ? PT Stop Time 0930   ? PT Time Calculation (min) 45 min   ? Equipment Utilized During Treatment Gait belt   ? Activity Tolerance Patient limited by fatigue;Patient tolerated treatment well   ? Behavior During Therapy [facility] for tasks assessed/performed   ? ?  ?  ? ?  ? ? ?Past Medical History:  ?Diagnosis Date  ? Agent orange exposure   ? Diabetes mellitus without complication ([place])   ? Hypertension   ? Neuropathy   ? Rectal cancer ([place])   ? ? ?Past Surgical History:  ?Procedure Laterality Date  ? ILEOSTOMY    ? TONSILLECTOMY    ? ? ?There were no vitals filed for this visit. ? ? Subjective Assessment - 09/21/21 0842   ? ? Subjective Pt reports no falls or LOb since last session. Pt report ssome shcoulder pain but it is chronic in nature. No other changes since last session.   ? Patient is accompained by: Family member   ? Pertinent History Patient presents to PT for CVA. He presented to the ED after weakness on 08/15/21 causing him to fall; upon hospital discharge he was then admitted to inpatient rehab on 08/19/21 and discharged 08/30/21. PMH includes HTN, PVD, CKD, Parkinsonism, rectal cancer s/p resection with colostomy. Patient lives in a one story home with spouse and was independent with ADLs prior to hospitalization. Utilizes a rollator for mobility at baseline at night. His RLE was primarily affected.   ? Limitations Lifting;Standing;Walking;House hold activities   ? How long can you sit  comfortably? n/a   ? How long can you stand comfortably? limited   ? How long can you walk comfortably? 10 minutes with AD   ? Patient Stated Goals get RLE stronger, emprove endurance   ? Pain Onset More than a month ago   ? ?  ?  ? ?  ? ? ? ?INTERVENTIONS: ?  ? ? ? ?Exercise/Activity Sets/ Reps/Time/ Resistance Assistance Charge type Comments  ?Lateral step taps  X 10 ea  CGA/ U UE  NMR  Unable to complete without UE support, fatigue noted following   ?Semitandem on airex  2*30 sec ea  CGA-MinA throughout for balance.  Neuro re-ed Improved stability compared to previous session.   ?Marching standing  10 x each B UE  Therex  Rest between sets, decreased amplitude of movement with fatigue   ?Anterior step taps  10 x ea  U UE  NMR Difficulty with foot cleartance bilaterally, particularly with increased reps/ fatigue   ?Heel raises in seated  2 x 10 x 5 x 4#  sec holds   Therex  Easy, consider more weight   ?SLS progression: 1 LE floor, 1 LE on step  1 x 30 sec ea  No UE support,CGA  Neuro re-ed 6RPE  ?Seated heel to toe rocks on 1/2 Foam 2 x 15   NMR  Good ROM,  limited fatigue with this exercise, to improve balance recovery ant/ post and to activate the targeted muscled for this task.   ?      ?Nustep  Level 1 x 5 min   therex Cues for cadence, better able to maintain close to 50 SPM this session   ?LAQ  2 x 5 w/ 4# AW  therex   ?      ?Treatment provided this session ? ? ?Pt educated throughout session about proper posture and technique with exercises. Improved exercise technique, movement at target joints, use of target muscles after min to mod verbal, visual, tactile cues. ?Note: Portions of this document were prepared using Dragon voice recognition software and although reviewed may contain unintentional dictation errors in syntax, grammar, or spelling. ? ?Pt required occasional rest breaks due fatigue, PT was quick to ask when pt appeared to be fatiguing in order to prevent excessive  fatigue. ? ? ? ? ? ? ? ? ? ? ? ? ? ? ? ? ? ? ? ? ? ? ? ? ? ? ? ? ? PT Education - 09/21/21 0842   ? ? Education Details exercise   ? Person(s) Educated Patient   ? Methods Explanation;Demonstration   ? Comprehension Verbalized understanding;Returned demonstration   ? ?  ?  ? ?  ? ? ? PT Short Term Goals - 09/06/21 1212   ? ?  ? PT SHORT TERM GOAL #1  ? Title Patient will be independent in home exercise program to improve strength/mobility for better functional independence with ADLs.   ? Baseline 3/14: HEP given   ? Time 4   ? Period Weeks   ? Status New   ? Target Date 10/04/21   ? ?  ?  ? ?  ? ? ? ? PT Long Term Goals - 09/06/21 1212   ? ?  ? PT LONG TERM GOAL #1  ? Title Patient will increase FOTO score to equal to or greater than  67%   to demonstrate statistically significant improvement in mobility and quality of life.   ? Baseline 3/14: 52%   ? Time 12   ? Period Weeks   ? Status New   ? Target Date 11/29/21   ?  ? PT LONG TERM GOAL #2  ? Title Patient (> 27 years old) will complete five times sit to stand test in < 15 seconds without UE support indicating an increased LE strength and improved balance.   ? Baseline 3/14: 20.4 seconds with BUE support   ? Time 12   ? Period Weeks   ? Status New   ? Target Date 11/29/21   ?  ? PT LONG TERM GOAL #3  ? Title Patient will increase Berg Balance score by > 6 points (26/56) to demonstrate decreased fall risk during functional activities.   ? Baseline 3/14: 20/56   ? Time 12   ? Period Weeks   ? Status New   ? Target Date 11/29/21   ?  ? PT LONG TERM GOAL #4  ? Title Patient will increase 10 meter walk test to >1.27ms as to improve gait speed for better community ambulation and to reduce fall risk   ? Baseline 3/14: 0.36 m/s with RW   ? Time 12   ? Period Weeks   ? Status New   ? Target Date 11/29/21   ?  ? PT LONG TERM GOAL #5  ? Title Patient will increase BLE gross strength  to 4+/5 as to improve functional strength for independent gait, increased standing tolerance  and increased ADL ability   ? Baseline 3/14: see note   ? Time 12   ? Period Weeks   ? Status New   ? Target Date 11/29/21   ? ?  ?  ? ?  ? ? ? ? ? ? ? ? Plan - 09/21/21 0844   ? ? Clinical Impression Statement Patient motivated participated well within session. Patient did have fatigue throughout session and required frequent rest breaks as a result. Patient demonstrates significant signs of imbalance when limiting base of support or standing on unsteady surfaces but these were slightly imroved from previous session. Pt demnstrated improved heel raise range of motion this session when performing weighted in seated position.  Patient will continue to benefit from skilled physical therapy intervention in order to improve lower extremity strength, balance, and mobility.   ? Personal Factors and Comorbidities Age;Comorbidity 3+;Fitness;Past/Current Experience;Transportation   ? Comorbidities HTN, PVD, CKD, Parkinsonism, rectal cancer s/p resection with colostomy   ? Examination-Activity Limitations Bathing;Bed Mobility;Bend;Caring for [employer];Locomotion Level;Lift;Squat;Stairs;Stand;Toileting;Transfers   ? Examination-Participation Restrictions Cleaning;Community Activity;Meal Prep;Laundry;Shop;Volunteer;Valla [employer]aver Work   ? Stability/Clinical Decision Making Evolving/Moderate complexity   ? Rehab Potential Fair   ? PT Frequency 2x / week   ? PT Duration 12 weeks   ? PT Treatment/Interventions ADLs/Self Care Home Management;Canalith Repostioning;Cryotherapy;Electrical Stimulation;Moist Heat;Traction;Ultrasound;Contrast Bath;DME Instruction;Gait training;Stair training;Functional mobility training;Therapeutic activities;Therapeutic exercise;Balance training;Neuromuscular re-education;Patient/family education;Orthotic Fit/Training;Manual techniques;Passive range of motion;Vestibular;Taping;Energy conservation;Dry needling;Visual/perceptual remediation/compensation   ? PT Next Visit Plan strength,  RLE stabilization, balance   ? PT Home Exercise Plan no changes this session   ? Consulted and Agree with Plan of Care Patient;Family member/caregiver   ? Family Member Consulted wife   ? ?  ?  ? ?  ? ? ?Patient will benefit from skilled th

## 2021-09-22 ENCOUNTER — Ambulatory Visit: Payer: PPO | Admitting: Occupational Therapy

## 2021-09-22 ENCOUNTER — Ambulatory Visit: Payer: PPO

## 2021-09-22 DIAGNOSIS — M6281 Muscle weakness (generalized): Secondary | ICD-10-CM

## 2021-09-22 DIAGNOSIS — R2681 Unsteadiness on feet: Secondary | ICD-10-CM

## 2021-09-22 DIAGNOSIS — R278 Other lack of coordination: Secondary | ICD-10-CM

## 2021-09-22 DIAGNOSIS — G8929 Other chronic pain: Secondary | ICD-10-CM

## 2021-09-22 DIAGNOSIS — R262 Difficulty in walking, not elsewhere classified: Secondary | ICD-10-CM

## 2021-09-22 DIAGNOSIS — R2689 Other abnormalities of gait and mobility: Secondary | ICD-10-CM

## 2021-09-22 DIAGNOSIS — I63033 Cerebral infarction due to thrombosis of bilateral carotid arteries: Secondary | ICD-10-CM

## 2021-09-22 NOTE — Therapy (Signed)
[place] ?[place], [place], [address] ?Phone: [phone]   Fax:  [phone] ? ?Physical Therapy Treatment ? ?Patient Details  ?Name: Justin Mann ?MRN: [id number] ?Date of Birth: 08-30-1945 ?Referring Provider (PT): Angiulli, Quillian Quince ? ? ?Encounter Date: 09/22/2021 ? ? PT End of Session - 09/22/21 6962   ? ? Visit Number 6   ? Number of Visits 24   ? Date for PT Re-Evaluation 11/29/21   ? Authorization Type 1/10 eval 09/06/21   ? PT Start Time [phone]   ? PT Stop Time 0844   ? PT Time Calculation (min) 41 min   ? Equipment Utilized During Treatment Gait belt   ? Activity Tolerance Patient limited by fatigue;Patient tolerated treatment well   ? Behavior During Therapy [facility] for tasks assessed/performed   ? ?  ?  ? ?  ? ? ?Past Medical History:  ?Diagnosis Date  ? Agent orange exposure   ? Diabetes mellitus without complication ([place])   ? Hypertension   ? Neuropathy   ? Rectal cancer ([place])   ? ? ?Past Surgical History:  ?Procedure Laterality Date  ? ILEOSTOMY    ? TONSILLECTOMY    ? ? ?There were no vitals filed for this visit. ? ? Subjective Assessment - 09/22/21 0809   ? ? Subjective Patient reports doing okay. Did report some soreness from yesterday.   ? Patient is accompained by: Family member   ? Pertinent History Patient presents to PT for CVA. He presented to the ED after weakness on 08/15/21 causing him to fall; upon hospital discharge he was then admitted to inpatient rehab on 08/19/21 and discharged 08/30/21. PMH includes HTN, PVD, CKD, Parkinsonism, rectal cancer s/p resection with colostomy. Patient lives in a one story home with spouse and was independent with ADLs prior to hospitalization. Utilizes a rollator for mobility at baseline at night. His RLE was primarily affected.   ? Limitations Lifting;Standing;Walking;House hold activities   ? How long can you sit comfortably? n/a   ? How long can you stand comfortably? limited   ? How long can you  walk comfortably? 10 minutes with AD   ? Patient Stated Goals get RLE stronger, emprove endurance   ? Currently in Pain? Yes   ? Pain Score 5    ? Pain Location Shoulder   ? Pain Orientation Right   ? Pain Descriptors / Indicators Aching;Sore;Tightness   ? Pain Type Chronic pain   ? Pain Onset More than a month ago   ? Pain Frequency Constant   ? Aggravating Factors  UE activity   ? Pain Relieving Factors Meds   ? Effect of Pain on Daily Activities Difficulty with performing ADL's   ? ?  ?  ? ?  ? ? ?  ?Exercise/Activity Sets/ Reps/Time/ Resistance Assistance Charge type Comments  ?Seated ham curl  X 15 ea RTB CGA/ U UE  Therex Patient reports "challenging" - difficulty completing through full ROM- fatigues quickly  ?Semitandem on airex  2*30 sec ea  CGA-MinA throughout for balance.  Neuro re-ed [place] ankle strategy- Shaky- min reach for R[place] in // bars today.   ?Step tap followed by step up onto 6" step  10 x each B UE  Therex  Rest between exercises, Patient reports "started off easy but was challenging at the end."   ?Toe raises in seated  X 15 with 3  sec holds    Therex  VC  to maintain position  ?Standing heel to toe rocks in // bars x 15   B UE Support NMR  Difficulty achieving full ROM with both, limited fatigue with this exercise, to improve balance recovery ant/ post and to activate the targeted muscled for this task.   ? Sit to stand with airex pad on seat for increased height- No UE Support  x 8 reps  No UE support  Therex  Patient initially required hands on 1st attempt yet able to complete.   ?Nustep  Level 1 x 5 min    therex Cues for cadence, better able to maintain close to 50 SPM this session  ?RPE= 5/10 and HR= 61; O2 sat =98%  ?Step forward/backward over 1/2 foam   X 10 each direction  BUE support in // bars Neuro  Min difficulty with foot clearance going backward  ? Side step over 1/2 foam  x 10  BUE Support in //bars  Neuro    ? ? ? ? ? ? ? ? ? ? ? ? ? ? ? ? ? ? ? ? ? ? ? ? ? ? ? PT  Education - 09/22/21 0811   ? ? Education Details Exercise form/technique   ? Person(s) Educated Patient   ? Methods Explanation;Demonstration;Tactile cues;Verbal cues   ? Comprehension Verbalized understanding;Returned demonstration;Verbal cues required;Tactile cues required;Need further instruction   ? ?  ?  ? ?  ? ? ? PT Short Term Goals - 09/06/21 1212   ? ?  ? PT SHORT TERM GOAL #1  ? Title Patient will be independent in home exercise program to improve strength/mobility for better functional independence with ADLs.   ? Baseline 3/14: HEP given   ? Time 4   ? Period Weeks   ? Status New   ? Target Date 10/04/21   ? ?  ?  ? ?  ? ? ? ? PT Long Term Goals - 09/06/21 1212   ? ?  ? PT LONG TERM GOAL #1  ? Title Patient will increase FOTO score to equal to or greater than  67%   to demonstrate statistically significant improvement in mobility and quality of life.   ? Baseline 3/14: 52%   ? Time 12   ? Period Weeks   ? Status New   ? Target Date 11/29/21   ?  ? PT LONG TERM GOAL #2  ? Title Patient (> 80 years old) will complete five times sit to stand test in < 15 seconds without UE support indicating an increased LE strength and improved balance.   ? Baseline 3/14: 20.4 seconds with BUE support   ? Time 12   ? Period Weeks   ? Status New   ? Target Date 11/29/21   ?  ? PT LONG TERM GOAL #3  ? Title Patient will increase Berg Balance score by > 6 points (26/56) to demonstrate decreased fall risk during functional activities.   ? Baseline 3/14: 20/56   ? Time 12   ? Period Weeks   ? Status New   ? Target Date 11/29/21   ?  ? PT LONG TERM GOAL #4  ? Title Patient will increase 10 meter walk test to >1.46ms as to improve gait speed for better community ambulation and to reduce fall risk   ? Baseline 3/14: 0.36 m/s with RW   ? Time 12   ? Period Weeks   ? Status New   ? Target Date 11/29/21   ?  ?  PT LONG TERM GOAL #5  ? Title Patient will increase BLE gross strength to 4+/5 as to improve functional strength for  independent gait, increased standing tolerance and increased ADL ability   ? Baseline 3/14: see note   ? Time 12   ? Period Weeks   ? Status New   ? Target Date 11/29/21   ? ?  ?  ? ?  ? ? ? ? ? ? ? ? Plan - 09/22/21 0757   ? ? Clinical Impression Statement Patient presents with good motivation and able to participate well with provided rest breaks. His main limiting factor is fatigue with activities. He was able to increase his overall ability to participate with standing activities. He required only VC and visual Demo to perform all activities safely and correctly. Patient will benefit from skilled physical therapy to improve strength, mobility, and capacity for functional activity   ? Personal Factors and Comorbidities Age;Comorbidity 3+;Fitness;Past/Current Experience;Transportation   ? Comorbidities HTN, PVD, CKD, Parkinsonism, rectal cancer s/p resection with colostomy   ? Examination-Activity Limitations Bathing;Bed Mobility;Bend;Caring for [employer];Locomotion Level;Lift;Squat;Stairs;Stand;Toileting;Transfers   ? Examination-Participation Restrictions Cleaning;Community Activity;Meal Prep;Laundry;Shop;Volunteer;Valla Leaver Work   ? Stability/Clinical Decision Making Evolving/Moderate complexity   ? Rehab Potential Fair   ? PT Frequency 2x / week   ? PT Duration 12 weeks   ? PT Treatment/Interventions ADLs/Self Care Home Management;Canalith Repostioning;Cryotherapy;Electrical Stimulation;Moist Heat;Traction;Ultrasound;Contrast Bath;DME Instruction;Gait training;Stair training;Functional mobility training;Therapeutic activities;Therapeutic exercise;Balance training;Neuromuscular re-education;Patient/family education;Orthotic Fit/Training;Manual techniques;Passive range of motion;Vestibular;Taping;Energy conservation;Dry needling;Visual/perceptual remediation/compensation   ? PT Next Visit Plan strength, RLE stabilization, balance   ? PT Home Exercise Plan no changes this session   ?  Consulted and Agree with Plan of Care Patient;Family member/caregiver   ? Family Member Consulted wife   ? ?  ?  ? ?  ? ? ?Patient will benefit from skilled therapeutic intervention in order to improve the following defici

## 2021-09-23 ENCOUNTER — Encounter: Payer: Self-pay | Admitting: Occupational Therapy

## 2021-09-23 NOTE — Therapy (Signed)
?[facility] ?[place], [place], [address] ?Phone: [phone]   Fax:  [phone] ? ?Occupational Therapy Treatment ? ?Patient Details  ?Name: Justin Mann ?MRN: [id number] ?Date of Birth: 1945/10/01 ?No data recorded ? ?Encounter Date: 09/21/2021 ? ? OT End of Session - 09/23/21 2043   ? ? Visit Number 5   ? Number of Visits 24   ? Date for OT Re-Evaluation 11/29/21   ? OT Start Time 0930   ? OT Stop Time 1015   ? OT Time Calculation (min) 45 min   ? Activity Tolerance Patient tolerated treatment well   ? Behavior During Therapy [facility] for tasks assessed/performed   ? ?  ?  ? ?  ? ? ?Past Medical History:  ?Diagnosis Date  ? Agent orange exposure   ? Diabetes mellitus without complication ([place])   ? Hypertension   ? Neuropathy   ? Rectal cancer ([place])   ? ? ?Past Surgical History:  ?Procedure Laterality Date  ? ILEOSTOMY    ? TONSILLECTOMY    ? ? ?There were no vitals filed for this visit. ? ? Subjective Assessment - 09/23/21 2042   ? ? Subjective  Pt reports he is doing well today   ? Pertinent History Pt. presented to the ED after weakness on 08/15/21 causing him to fall; upon hospital discharge he was then admitted to inpatient rehab on 08/19/21 and discharged 08/30/21. PMH includes HTN, PVD, CKD, Parkinsonism, rectal cancer s/p resection with colostomy. Patient lives in a one story home with spouse and was independent with ADLs prior to hospitalization. Utilizes a rollator for mobility at baseline at night.   ? Patient Stated Goals Pt reports he would like to get back to normal and not relying on wife for everything.   ? Currently in Pain? Yes   ? Pain Score 5    ? Pain Location Shoulder   ? Pain Orientation Right   ? Pain Descriptors / Indicators Aching;Sore   ? Pain Type Chronic pain   ? Pain Frequency Constant   ? ?  ?  ? ?  ? ? ?Mobilizations to right scapula for elevation, retraction and upwards rotation to decrease pain and help to facilitate more  active motion in pain free ranges.   ?Pt seen for right UE therapeutic exercise for PROM followed by A/AAROM for shoulder for flexion, ABD, ER, elbow flex/ext, supination/pronation, wrist flex/ext, digit flex/ext, assisted by therapist towards end range of shoulder motion in all planes.   ?Reaching task with use of shape tower on tabletop, performed from a seated position.  Able to complete 3 levels, but unable to produce reach for 4th highest level of reach this date.    ?Manipulation of [place] discs to pick up, place into grid, turn and flip with cues for isolated finger movements, completed entire board for flipping towards red side.  Stacking up to 4 discs and picking up with whole hand grasping pattern to place into case.   ? ?Response to tx: ?Pt continues to progress with active/assisted ROM for shoulder in all planes.  Increased effort to complete 3 levels of shape tower, unable to perform the 4th level at this time.  Decreased tremors this date when performing manipulation of [place] discs.  Pt requires occasional rest breaks with exercises and benefits from cues for proper form and technique.  Continue to work towards goals in plan of care to address right UE pain, increase ROM,  improve coordination for use of RUE in functional daily tasks.   ? ? ? ? ? ? ? ? ? ? ? ? ? ? ? ? ? ? ? ? OT Education - 09/23/21 2043   ? ? Education Details UE ROM/strengthening, coordination   ? Person(s) Educated Patient;Spouse   ? Methods Explanation;Demonstration;Verbal cues   ? Comprehension Verbalized understanding;Returned demonstration;Verbal cues required   ? ?  ?  ? ?  ? ? ? ? ? ? OT Long Term Goals - 09/13/21 0915   ? ?  ? OT LONG TERM GOAL #1  ? Title Pt will demonstrate independence with home exercise program   ? Baseline no current program   ? Time 12   ? Period Weeks   ? Status New   ? Target Date 11/29/21   ?  ? OT LONG TERM GOAL #2  ? Title Patient will improve active range of motion of right shoulder for  flexion by 15 degrees to be able to reach in the closet to access clothing with modified independence.   ? Baseline Difficulty with reaching for items with right upper extremity   ? Time 12   ? Period Weeks   ? Status New   ? Target Date 11/29/21   ?  ? OT LONG TERM GOAL #3  ? Title Patient will improve right grip strength by 10 pounds to be able to grasp and lift items to move from 1 surface to another with modified independence.   ? Baseline Grip strength on the right 45 pounds   ? Time 12   ? Period Weeks   ? Status New   ? Target Date 11/29/21   ?  ? OT LONG TERM GOAL #4  ? Title Patient to demonstrate activity tolerance of 15 minutes with no rest breaks during ADL/IADL tasks   ? Baseline Patient fatigues easily and requires frequent rest breaks at eval   ? Time 6   ? Period Weeks   ? Status New   ? Target Date 10/18/21   ?  ? OT LONG TERM GOAL #5  ? Title Patient will improve Foto score to 79 or better to show a clinical relevant change in function to impact his level of independence with ADL and IADL tasks   ? Baseline Eval: 66   ? Time 12   ? Period Weeks   ? Status New   ? Target Date 11/29/21   ?  ? Long Term Additional Goals  ? Additional Long Term Goals Yes   ?  ? OT LONG TERM GOAL #6  ? Title Patient will demonstrate ability to feed cats with modified independence including opening containers/packages and placing into bowl.   ? Baseline Eval requires assistance   ? Time 12   ? Period Weeks   ? Status New   ? Target Date 11/29/21   ?  ? OT LONG TERM GOAL #7  ? Title Patient will complete basic self-care tasks with modified independence   ? Baseline At eval requires occasional supervision to min assist   ? Time 6   ? Period Weeks   ? Status New   ? Target Date 10/18/21   ?  ? OT LONG TERM GOAL #8  ? Title Patient will demonstrate the ability to print and sign his name with 90% or better legibility.   ? Baseline At eval decreased legibility, micrographia   ? Time 12   ? Period Weeks   ?  Status New   ?  Target Date 11/29/21   ? ?  ?  ? ?  ? ? ? ? ? ? ? ? Plan - 09/23/21 2044   ? ? Clinical Impression Statement Pt continues to progress with active/assisted ROM for shoulder in all planes.  Increased effort to complete 3 levels of shape tower, unable to perform the 4th level at this time.  Decreased tremors this date when performing manipulation of [place] discs.  Pt requires occasional rest breaks with exercises and benefits from cues for proper form and technique.  Continue to work towards goals in plan of care to address right UE pain, increase ROM, improve coordination for use of RUE in functional daily tasks.   ? OT Occupational Profile and History Detailed Assessment- Review of Records and additional review of physical, cognitive, psychosocial history related to current functional performance   ? Occupational performance deficits (Please refer to evaluation for details): ADL's;IADL's;Leisure   ? Body Structure / Function / Physical Skills ADL;Coordination;Endurance;GMC;UE functional use;Balance;Decreased knowledge of use of DME;Flexibility;IADL;Pain;Vision;Dexterity;FMC;Strength;Gait;Mobility;ROM   ? Psychosocial Skills Habits;Environmental  Adaptations;Routines and Behaviors   ? Rehab Potential Good   ? Clinical Decision Making Several treatment options, min-mod task modification necessary   ? Comorbidities Affecting Occupational Performance: May have comorbidities impacting occupational performance   ? Modification or Assistance to Complete Evaluation  No modification of tasks or assist necessary to complete eval   ? OT Frequency 2x / week   ? OT Duration 12 weeks   ? OT Treatment/Interventions Self-care/ADL training;Cryotherapy;Paraffin;Therapeutic exercise;DME and/or AE instruction;Functional Mobility Training;Cognitive remediation/compensation;Balance training;Neuromuscular education;Manual Therapy;Visual/perceptual remediation/compensation;Psychosocial skills training;Moist Heat;Contrast Bath;Energy  conservation;Passive range of motion;Therapeutic activities;Patient/family education;Coping strategies training   ? Consulted and Agree with Plan of Care Patient;Family member/caregiver   ? Family Member Consulted

## 2021-09-24 ENCOUNTER — Encounter: Payer: Self-pay | Admitting: Occupational Therapy

## 2021-09-24 NOTE — Therapy (Signed)
[facility] ?[place], [place], [address] ?Phone: [phone]   Fax:  [phone] ? ?Occupational Therapy Treatment ? ?Patient Details  ?Name: Justin Mann ?MRN: [id number] ?Date of Birth: 09-Feb-1946 ?No data recorded ? ?Encounter Date: 09/22/2021 ? ? OT End of Session - 09/24/21 1730   ? ? Visit Number 6   ? Number of Visits 24   ? Date for OT Re-Evaluation 11/29/21   ? OT Start Time 0845   ? OT Stop Time 0930   ? OT Time Calculation (min) 45 min   ? Activity Tolerance Patient tolerated treatment well   ? Behavior During Therapy [facility] for tasks assessed/performed   ? ?  ?  ? ?  ? ? ?Past Medical History:  ?Diagnosis Date  ? Agent orange exposure   ? Diabetes mellitus without complication ([place])   ? Hypertension   ? Neuropathy   ? Rectal cancer ([place])   ? ? ?Past Surgical History:  ?Procedure Laterality Date  ? ILEOSTOMY    ? TONSILLECTOMY    ? ? ?There were no vitals filed for this visit. ? ? Subjective Assessment - 09/24/21 1728   ? ? Subjective  Pt reports right shoulder soreness from last session. States, ?I can tell we worked out the other day? Reports pain 5/10 in right shoulder. Pt reports he is excited about going out to eat tonight with family for wife?s birthday.   ? Pertinent History Pt. presented to the ED after weakness on 08/15/21 causing him to fall; upon hospital discharge he was then admitted to inpatient rehab on 08/19/21 and discharged 08/30/21. PMH includes HTN, PVD, CKD, Parkinsonism, rectal cancer s/p resection with colostomy. Patient lives in a one story home with spouse and was independent with ADLs prior to hospitalization. Utilizes a rollator for mobility at baseline at night.   ? Patient Stated Goals Pt reports he would like to get back to normal and not relying on wife for everything.   ? Currently in Pain? Yes   ? Pain Score 5    ? Pain Location Shoulder   ? Pain Orientation Right   ? Pain Descriptors / Indicators Aching;Sore   ?  Pain Type Chronic pain   ? Pain Onset More than a month ago   ? Pain Frequency Constant   ? ?  ?  ? ?  ? ? ?Therapeutic Exercises: ? ?Pt seen for ROM and strengthening with use of 2# weighted dowel exercises for shoulder flexion, ABD, ADD, chest press, forwards and backwards circles, dowel climbing with hand over hand in vertical position.  Increased weight this date. Performed 2 sets of 10 reps each.  Therapist demonstration and cues for proper form and technique.  ?Finger strengthening with use of resistive small snap beads putting together and removing, occasional cues for prehension patterns and force to place items together. ? ?ADL: ?Pt seen for focus on ADL task of Handwriting with med grip pen and formulating a list of cars, legibility 5/10 this date.  Also formulating a list of musicians with 6/10 legibility.   ? ?Neuromuscular reeducation: ?Pt seen for manipulation of coins from a flat surface, picking up to place into resistive bank.  Pt demonstrates difficulty with using hand for storage when moving coins from fingertips to palm and holding there.   ? ?Response to tx:   ?Pt with improved handwriting this date, increased legibility when formulating list of items.  Pt able to increase  dowel weight to 2# without any increase in symptoms.  Difficulty with using the hand for storage.  Continue to work towards goals to improve right UE use in ADL and IADL tasks at home and in the community.  ? ? ?Response to tx: ? ? ? ? ? ? ? ? ? ? ? ? ? ? ? ? ? ? OT Education - 09/24/21 1730   ? ? Education Details UE ROM/strengthening, coordination, handwriting   ? Person(s) Educated Patient;Spouse   ? Methods Explanation;Demonstration;Verbal cues   ? Comprehension Verbalized understanding;Returned demonstration;Verbal cues required   ? ?  ?  ? ?  ? ? ? ? ? ? OT Long Term Goals - 09/13/21 0915   ? ?  ? OT LONG TERM GOAL #1  ? Title Pt will demonstrate independence with home exercise program   ? Baseline no current program   ?  Time 12   ? Period Weeks   ? Status New   ? Target Date 11/29/21   ?  ? OT LONG TERM GOAL #2  ? Title Patient will improve active range of motion of right shoulder for flexion by 15 degrees to be able to reach in the closet to access clothing with modified independence.   ? Baseline Difficulty with reaching for items with right upper extremity   ? Time 12   ? Period Weeks   ? Status New   ? Target Date 11/29/21   ?  ? OT LONG TERM GOAL #3  ? Title Patient will improve right grip strength by 10 pounds to be able to grasp and lift items to move from 1 surface to another with modified independence.   ? Baseline Grip strength on the right 45 pounds   ? Time 12   ? Period Weeks   ? Status New   ? Target Date 11/29/21   ?  ? OT LONG TERM GOAL #4  ? Title Patient to demonstrate activity tolerance of 15 minutes with no rest breaks during ADL/IADL tasks   ? Baseline Patient fatigues easily and requires frequent rest breaks at eval   ? Time 6   ? Period Weeks   ? Status New   ? Target Date 10/18/21   ?  ? OT LONG TERM GOAL #5  ? Title Patient will improve Foto score to 79 or better to show a clinical relevant change in function to impact his level of independence with ADL and IADL tasks   ? Baseline Eval: 66   ? Time 12   ? Period Weeks   ? Status New   ? Target Date 11/29/21   ?  ? Long Term Additional Goals  ? Additional Long Term Goals Yes   ?  ? OT LONG TERM GOAL #6  ? Title Patient will demonstrate ability to feed cats with modified independence including opening containers/packages and placing into bowl.   ? Baseline Eval requires assistance   ? Time 12   ? Period Weeks   ? Status New   ? Target Date 11/29/21   ?  ? OT LONG TERM GOAL #7  ? Title Patient will complete basic self-care tasks with modified independence   ? Baseline At eval requires occasional supervision to min assist   ? Time 6   ? Period Weeks   ? Status New   ? Target Date 10/18/21   ?  ? OT LONG TERM GOAL #8  ? Title Patient will demonstrate the ability  to print and sign his name with 90% or better legibility.   ? Baseline At eval decreased legibility, micrographia   ? Time 12   ? Period Weeks   ? Status New   ? Target Date 11/29/21   ? ?  ?  ? ?  ? ? ? ? ? ? ? ? Plan - 09/24/21 1731   ? ? Clinical Impression Statement Pt with improved handwriting this date, increased legibility when formulating list of items.  Pt able to increase dowel weight to 2# without any increase in symptoms.  Difficulty with using the hand for storage.  Continue to work towards goals to improve right UE use in ADL and IADL tasks at home and in the community.   ? OT Occupational Profile and History Detailed Assessment- Review of Records and additional review of physical, cognitive, psychosocial history related to current functional performance   ? Occupational performance deficits (Please refer to evaluation for details): ADL's;IADL's;Leisure   ? Body Structure / Function / Physical Skills ADL;Coordination;Endurance;GMC;UE functional use;Balance;Decreased knowledge of use of DME;Flexibility;IADL;Pain;Vision;Dexterity;FMC;Strength;Gait;Mobility;ROM   ? Psychosocial Skills Habits;Environmental  Adaptations;Routines and Behaviors   ? Rehab Potential Good   ? Clinical Decision Making Several treatment options, min-mod task modification necessary   ? Comorbidities Affecting Occupational Performance: May have comorbidities impacting occupational performance   ? Modification or Assistance to Complete Evaluation  No modification of tasks or assist necessary to complete eval   ? OT Frequency 2x / week   ? OT Duration 12 weeks   ? OT Treatment/Interventions Self-care/ADL training;Cryotherapy;Paraffin;Therapeutic exercise;DME and/or AE instruction;Functional Mobility Training;Cognitive remediation/compensation;Balance training;Neuromuscular education;Manual Therapy;Visual/perceptual remediation/compensation;Psychosocial skills training;Moist Heat;Contrast Bath;Energy conservation;Passive range of  motion;Therapeutic activities;Patient/family education;Coping strategies training   ? Consulted and Agree with Plan of Care Patient;Family member/caregiver   ? Family Member Consulted wife, [name]   ? ?  ?

## 2021-09-27 ENCOUNTER — Encounter: Payer: Self-pay | Admitting: Occupational Therapy

## 2021-09-27 ENCOUNTER — Ambulatory Visit: Payer: PPO | Attending: Physician Assistant | Admitting: Physical Therapy

## 2021-09-27 ENCOUNTER — Ambulatory Visit: Payer: PPO | Admitting: Occupational Therapy

## 2021-09-27 ENCOUNTER — Encounter: Payer: Self-pay | Admitting: Physical Therapy

## 2021-09-27 DIAGNOSIS — G8929 Other chronic pain: Secondary | ICD-10-CM

## 2021-09-27 DIAGNOSIS — R2689 Other abnormalities of gait and mobility: Secondary | ICD-10-CM | POA: Diagnosis present

## 2021-09-27 DIAGNOSIS — R262 Difficulty in walking, not elsewhere classified: Secondary | ICD-10-CM | POA: Diagnosis present

## 2021-09-27 DIAGNOSIS — R278 Other lack of coordination: Secondary | ICD-10-CM

## 2021-09-27 DIAGNOSIS — R2681 Unsteadiness on feet: Secondary | ICD-10-CM | POA: Diagnosis present

## 2021-09-27 DIAGNOSIS — M25511 Pain in right shoulder: Secondary | ICD-10-CM | POA: Insufficient documentation

## 2021-09-27 DIAGNOSIS — M6281 Muscle weakness (generalized): Secondary | ICD-10-CM | POA: Insufficient documentation

## 2021-09-27 NOTE — Therapy (Signed)
[facility] ?[place], [place], [address] ?Phone: [phone]   Fax:  [phone] ? ?Occupational Therapy Treatment ? ?Patient Details  ?Name: Justin Mann ?MRN: [id number] ?Date of Birth: 04-Mar-1946 ?No data recorded ? ?Encounter Date: 09/27/2021 ? ? OT End of Session - 09/28/21 0857   ? ? Visit Number 7   ? Number of Visits 24   ? Date for OT Re-Evaluation 11/29/21   ? OT Start Time 0930   ? OT Stop Time 1015   ? OT Time Calculation (min) 45 min   ? Activity Tolerance Patient tolerated treatment well   ? Behavior During Therapy [facility] for tasks assessed/performed   ? ?  ?  ? ?  ? ? ?Past Medical History:  ?Diagnosis Date  ? Agent orange exposure   ? Diabetes mellitus without complication ([place])   ? Hypertension   ? Neuropathy   ? Rectal cancer ([place])   ? ? ?Past Surgical History:  ?Procedure Laterality Date  ? ILEOSTOMY    ? TONSILLECTOMY    ? ? ?There were no vitals filed for this visit. ? ? Subjective Assessment - 09/28/21 0856   ? ? Subjective  Pt reports he took some tylenol before therapy today, pain in right still 5/10   ? Pertinent History Pt. presented to the ED after weakness on 08/15/21 causing him to fall; upon hospital discharge he was then admitted to inpatient rehab on 08/19/21 and discharged 08/30/21. PMH includes HTN, PVD, CKD, Parkinsonism, rectal cancer s/p resection with colostomy. Patient lives in a one story home with spouse and was independent with ADLs prior to hospitalization. Utilizes a rollator for mobility at baseline at night.   ? Patient Stated Goals Pt reports he would like to get back to normal and not relying on wife for everything.   ? Currently in Pain? Yes   ? Pain Score 5    ? Pain Location Shoulder   ? Pain Orientation Right   ? Pain Descriptors / Indicators Aching;Sore   ? Pain Type Chronic pain   ? Pain Onset More than a month ago   ? Pain Frequency Constant   ? ?  ?  ? ?  ?Therapeutic Exercise: ? ?Pt seen for finger  strengthening exercise with use of push pins to place into moderate resistive board with right hand, cues for prehension patterns and to fully push pins into board.  Difficulty when placing pins into a spot that never had a hole placed before.   ?2# dumbbell weights bilateral UE for chest press, ABD, overhead press, elbow flexion/extension, supination/pronation, wrist flexion/extension for 10 reps each with therapist demo and cues for proper form and technique, another set of exercises completed with 1# weight for another 10 reps each.  ? ?Neuromuscular Reed: ? ?Manipulation of small marbles to pick up from container with right hand, one at a time to place into another container.  When moving back, pt picking up one at a time and moving to palm until he has 5 in his palm and then working each marble to fingertips to place into container using translatory movements of the hand.  Occasional cues as needed.  Working on speed and dexterity with this task.   ?Shuffling of cards with bilateral UEs, worked on thumb finger combinations with flipping and turning of cards.   ? ?Response to tx: ?Pt continues to progress, some difficulty with placing push pins into moderate resistive board when  there is not a predrilled hole.  Occasional cues required for prehension patterns with coordination tasks.  Continues to benefit from working on speed and dexterity of tasks.  Pt progressing with strength and able to increase to 2# this date with dumbbells for one round of exercises and then decreased back to 1# on the second round. Continue to work towards goals in plan of care to improve RUE strength, ROM and use for necessary daily tasks.   ? ? ? ? ? ? ? ? ? ? ? ? ? ? ? ? ? ? ? ? ? ? ? OT Education - 09/28/21 0857   ? ? Education Details UE ROM/strengthening, coordination, prehension patterns   ? Person(s) Educated Patient;Spouse   ? Methods Explanation;Demonstration;Verbal cues   ? Comprehension Verbalized understanding;Returned  demonstration;Verbal cues required   ? ?  ?  ? ?  ? ? ? ? ? ? OT Long Term Goals - 09/13/21 0915   ? ?  ? OT LONG TERM GOAL #1  ? Title Pt will demonstrate independence with home exercise program   ? Baseline no current program   ? Time 12   ? Period Weeks   ? Status New   ? Target Date 11/29/21   ?  ? OT LONG TERM GOAL #2  ? Title Patient will improve active range of motion of right shoulder for flexion by 15 degrees to be able to reach in the closet to access clothing with modified independence.   ? Baseline Difficulty with reaching for items with right upper extremity   ? Time 12   ? Period Weeks   ? Status New   ? Target Date 11/29/21   ?  ? OT LONG TERM GOAL #3  ? Title Patient will improve right grip strength by 10 pounds to be able to grasp and lift items to move from 1 surface to another with modified independence.   ? Baseline Grip strength on the right 45 pounds   ? Time 12   ? Period Weeks   ? Status New   ? Target Date 11/29/21   ?  ? OT LONG TERM GOAL #4  ? Title Patient to demonstrate activity tolerance of 15 minutes with no rest breaks during ADL/IADL tasks   ? Baseline Patient fatigues easily and requires frequent rest breaks at eval   ? Time 6   ? Period Weeks   ? Status New   ? Target Date 10/18/21   ?  ? OT LONG TERM GOAL #5  ? Title Patient will improve Foto score to 79 or better to show a clinical relevant change in function to impact his level of independence with ADL and IADL tasks   ? Baseline Eval: 66   ? Time 12   ? Period Weeks   ? Status New   ? Target Date 11/29/21   ?  ? Long Term Additional Goals  ? Additional Long Term Goals Yes   ?  ? OT LONG TERM GOAL #6  ? Title Patient will demonstrate ability to feed cats with modified independence including opening containers/packages and placing into bowl.   ? Baseline Eval requires assistance   ? Time 12   ? Period Weeks   ? Status New   ? Target Date 11/29/21   ?  ? OT LONG TERM GOAL #7  ? Title Patient will complete basic self-care tasks  with modified independence   ? Baseline At eval requires occasional supervision to  min assist   ? Time 6   ? Period Weeks   ? Status New   ? Target Date 10/18/21   ?  ? OT LONG TERM GOAL #8  ? Title Patient will demonstrate the ability to print and sign his name with 90% or better legibility.   ? Baseline At eval decreased legibility, micrographia   ? Time 12   ? Period Weeks   ? Status New   ? Target Date 11/29/21   ? ?  ?  ? ?  ? ? ? ? ? ? ? ? Plan - 09/28/21 0856   ? ? Clinical Impression Statement Pt continues to progress, some difficulty with placing push pins into moderate resistive board when there is not a predrilled hole.  Occasional cues required for prehension patterns with coordination tasks.  Continues to benefit from working on speed and dexterity of tasks.  Pt progressing with strength and able to increase to 2# this date with dumbbells for one round of exercises and then decreased back to 1# on the second round. Continue to work towards goals in plan of care to improve RUE strength, ROM and use for necessary daily tasks.   ? OT Occupational Profile and History Detailed Assessment- Review of Records and additional review of physical, cognitive, psychosocial history related to current functional performance   ? Occupational performance deficits (Please refer to evaluation for details): ADL's;IADL's;Leisure   ? Body Structure / Function / Physical Skills ADL;Coordination;Endurance;GMC;UE functional use;Balance;Decreased knowledge of use of DME;Flexibility;IADL;Pain;Vision;Dexterity;FMC;Strength;Gait;Mobility;ROM   ? Psychosocial Skills Habits;Environmental  Adaptations;Routines and Behaviors   ? Rehab Potential Good   ? Clinical Decision Making Several treatment options, min-mod task modification necessary   ? Comorbidities Affecting Occupational Performance: May have comorbidities impacting occupational performance   ? Modification or Assistance to Complete Evaluation  No modification of tasks or  assist necessary to complete eval   ? OT Frequency 2x / week   ? OT Duration 12 weeks   ? OT Treatment/Interventions Self-care/ADL training;Cryotherapy;Paraffin;Therapeutic exercise;DME and/or AE instruction;Functio

## 2021-09-27 NOTE — Therapy (Signed)
[place] ?[facility] ?[place], [place], [address] ?Phone: [phone]   Fax:  [phone] ? ?Physical Therapy Treatment ? ?Patient Details  ?Name: Justin Mann ?MRN: [id number] ?Date of Birth: 1946-06-07 ?Referring Provider (PT): Angiulli, Quillian Quince ? ? ?Encounter Date: 09/27/2021 ? ? PT End of Session - 09/27/21 8242   ? ? Visit Number 7   ? Number of Visits 24   ? Date for PT Re-Evaluation 11/29/21   ? Authorization Type 1/10 eval 09/06/21   ? PT Start Time [id number]   ? PT Stop Time 0930   ? PT Time Calculation (min) 43 min   ? Equipment Utilized During Treatment Gait belt   ? Activity Tolerance Patient limited by fatigue;Patient tolerated treatment well   ? Behavior During Therapy [facility] for tasks assessed/performed   ? ?  ?  ? ?  ? ? ?Past Medical History:  ?Diagnosis Date  ? Agent orange exposure   ? Diabetes mellitus without complication ([place])   ? Hypertension   ? Neuropathy   ? Rectal cancer ([place])   ? ? ?Past Surgical History:  ?Procedure Laterality Date  ? ILEOSTOMY    ? TONSILLECTOMY    ? ? ?There were no vitals filed for this visit. ? ? Subjective Assessment - 09/27/21 0851   ? ? Subjective Pt reports doing well. Reports no significant changes since last session.   ? Patient is accompained by: Family member   ? Pertinent History Patient presents to PT for CVA. He presented to the ED after weakness on 08/15/21 causing him to fall; upon hospital discharge he was then admitted to inpatient rehab on 08/19/21 and discharged 08/30/21. PMH includes HTN, PVD, CKD, Parkinsonism, rectal cancer s/p resection with colostomy. Patient lives in a one story home with spouse and was independent with ADLs prior to hospitalization. Utilizes a rollator for mobility at baseline at night. His RLE was primarily affected.   ? Limitations Lifting;Standing;Walking;House hold activities   ? How long can you sit comfortably? n/a   ? How long can you stand comfortably? limited   ? How long can  you walk comfortably? 10 minutes with AD   ? Patient Stated Goals get RLE stronger, emprove endurance   ? Pain Onset More than a month ago   ? ?  ?  ? ?  ? ? ? ? ? ? ?Exercise/Activity Sets/ Reps/Time/ Resistance Assistance Charge type Comments  ?Seated ham curl  X 15 ea RTB CGA/ U UE  Therex Patient reports "challenging" - difficulty completing through full ROM- fatigues quickly  ?Semitandem on airex  2*30 sec ea  CGA-MinA throughout for balance.  Neuro re-ed [place] ankle strategy- Shaky- min reach for Railing in // bars today.   ?Step up to 4 inch step  5 on ea LE  B UE  Therex  Pt rated med- hard difficulty   ?Toe raises in seated  2 X 10 w/3# AW with 3  sec holds    Therex  VC to maintain position  ?      ? Sit to stand with airex pad on seat for increased height- No UE Support  x 8 reps  No UE support  Therex  Patient initially required hands on 1st attempt yet able to complete.   ?Nustep  Level 2 x 5 min    therex Cues for cadence, difficulty maintaining 50 SPM at increased intensity  ?  ?Step forward/backward over hurdle  X 10 each direction  BUE support in // bars Neuro  Min difficulty with foot clearance going backward  ? Side step over hurdle  x 10  BUE Support in //bars  Neuro  increased difficulty with R LE foot clearance  ? ? ? ? ? ? ? ? ? ? ? ? ? ? ? ? ? ? ? ? ? ? ? ? PT Education - 09/27/21 0851   ? ? Education Details exercise form and technique   ? Person(s) Educated Patient   ? Methods Explanation;Demonstration   ? Comprehension Verbalized understanding;Returned demonstration   ? ?  ?  ? ?  ? ? ? PT Short Term Goals - 09/06/21 1212   ? ?  ? PT SHORT TERM GOAL #1  ? Title Patient will be independent in home exercise program to improve strength/mobility for better functional independence with ADLs.   ? Baseline 3/14: HEP given   ? Time 4   ? Period Weeks   ? Status New   ? Target Date 10/04/21   ? ?  ?  ? ?  ? ? ? ? PT Long Term Goals - 09/06/21 1212   ? ?  ? PT LONG TERM GOAL #1  ? Title Patient  will increase FOTO score to equal to or greater than  67%   to demonstrate statistically significant improvement in mobility and quality of life.   ? Baseline 3/14: 52%   ? Time 12   ? Period Weeks   ? Status New   ? Target Date 11/29/21   ?  ? PT LONG TERM GOAL #2  ? Title Patient (> 32 years old) will complete five times sit to stand test in < 15 seconds without UE support indicating an increased LE strength and improved balance.   ? Baseline 3/14: 20.4 seconds with BUE support   ? Time 12   ? Period Weeks   ? Status New   ? Target Date 11/29/21   ?  ? PT LONG TERM GOAL #3  ? Title Patient will increase Berg Balance score by > 6 points (26/56) to demonstrate decreased fall risk during functional activities.   ? Baseline 3/14: 20/56   ? Time 12   ? Period Weeks   ? Status New   ? Target Date 11/29/21   ?  ? PT LONG TERM GOAL #4  ? Title Patient will increase 10 meter walk test to >1.67ms as to improve gait speed for better community ambulation and to reduce fall risk   ? Baseline 3/14: 0.36 m/s with RW   ? Time 12   ? Period Weeks   ? Status New   ? Target Date 11/29/21   ?  ? PT LONG TERM GOAL #5  ? Title Patient will increase BLE gross strength to 4+/5 as to improve functional strength for independent gait, increased standing tolerance and increased ADL ability   ? Baseline 3/14: see note   ? Time 12   ? Period Weeks   ? Status New   ? Target Date 11/29/21   ? ?  ?  ? ?  ? ? ? ? ? ? ? ? Plan - 09/27/21 0852   ? ? Clinical Impression Statement Patient demonstrates good motivation for completion of physical therapy session.  Patient's main limiting factor continues to be fatigue requires rest breaks between exercises.  Patient did demonstrate improved continue this session with rest breaks decreasing in duration.  Patient still  challenged with right lower extremity foot placement and tends to have foot catch on objects such as steps and hurdles when stepping over these objects.  Patient will continue to benefit  from skilled physical therapy intervention to improve his strength, mobility, balance, and overall functional capacity.   ? Personal Factors and Comorbidities Age;Comorbidity 3+;Fitness;Past/Current Experience;Transportation   ? Comorbidities HTN, PVD, CKD, Parkinsonism, rectal cancer s/p resection with colostomy   ? Examination-Activity Limitations Bathing;Bed Mobility;Bend;Caring for [employer];Locomotion Level;Lift;Squat;Stairs;Stand;Toileting;Transfers   ? Examination-Participation Restrictions Cleaning;Community Activity;Meal Prep;Laundry;Shop;Volunteer;[place] [employer] Work   ? Stability/Clinical Decision Making Evolving/Moderate complexity   ? Rehab Potential Fair   ? PT Frequency 2x / week   ? PT Duration 12 weeks   ? PT Treatment/Interventions ADLs/Self Care Home Management;Canalith Repostioning;Cryotherapy;Electrical Stimulation;Moist Heat;Traction;Ultrasound;Contrast Bath;DME Instruction;Gait training;Stair training;Functional mobility training;Therapeutic activities;Therapeutic exercise;Balance training;Neuromuscular re-education;Patient/family education;Orthotic Fit/Training;Manual techniques;Passive range of motion;Vestibular;Taping;Energy conservation;Dry needling;Visual/perceptual remediation/compensation   ? PT Next Visit Plan strength, RLE stabilization, balance   ? PT Home Exercise Plan no changes this session   ? Consulted and Agree with Plan of Care Patient;Family member/caregiver   ? Family Member Consulted wife   ? ?  ?  ? ?  ? ? ?Patient will benefit from skilled therapeutic intervention in order to improve the following deficits and impairments:  Abnormal gait, Decreased activity tolerance, Cardiopulmonary status limiting activity, Decreased balance, Decreased knowledge of precautions, Decreased endurance, Decreased coordination, Decreased knowledge of use of DME, Decreased mobility, Difficulty walking, Decreased strength, Impaired flexibility, Impaired perceived functional  ability, Impaired tone, Impaired UE functional use, Postural dysfunction, Improper body mechanics, Pain ? ?Visit Diagnosis: ?Unsteadiness on feet ? ?Difficulty in walking, not elsewhere classified ? ?Other

## 2021-09-29 ENCOUNTER — Ambulatory Visit: Payer: PPO | Admitting: Physical Therapy

## 2021-09-29 ENCOUNTER — Ambulatory Visit: Payer: PPO | Admitting: Occupational Therapy

## 2021-09-29 DIAGNOSIS — R2681 Unsteadiness on feet: Secondary | ICD-10-CM | POA: Diagnosis not present

## 2021-09-29 DIAGNOSIS — G8929 Other chronic pain: Secondary | ICD-10-CM

## 2021-09-29 DIAGNOSIS — M6281 Muscle weakness (generalized): Secondary | ICD-10-CM

## 2021-09-29 DIAGNOSIS — R262 Difficulty in walking, not elsewhere classified: Secondary | ICD-10-CM

## 2021-09-29 DIAGNOSIS — R278 Other lack of coordination: Secondary | ICD-10-CM

## 2021-09-29 NOTE — Therapy (Signed)
[facility] ?[place], [place], [address] ?Phone: [phone]   Fax:  [phone] ? ?Physical Therapy Treatment ? ?Patient Details  ?Name: Justin Mann ?MRN: [id number] ?Date of Birth: [birth year] ?Referring Provider (PT): Angiulli, Quillian Quince ? ? ?Encounter Date: 09/29/2021 ? ? PT End of Session - 09/29/21 0934   ? ? Visit Number 8   ? Number of Visits 24   ? Date for PT Re-Evaluation 11/29/21   ? Authorization Type 1/10 eval 09/06/21   ? PT Start Time [id number]   ? PT Stop Time 1013   ? PT Time Calculation (min) 42 min   ? Equipment Utilized During Treatment Gait belt   ? Activity Tolerance Patient limited by fatigue;Patient tolerated treatment well   ? Behavior During Therapy [facility] for tasks assessed/performed   ? ?  ?  ? ?  ? ? ?Past Medical History:  ?Diagnosis Date  ? Agent orange exposure   ? Diabetes mellitus without complication ([place])   ? Hypertension   ? Neuropathy   ? Rectal cancer ([place])   ? ? ?Past Surgical History:  ?Procedure Laterality Date  ? ILEOSTOMY    ? TONSILLECTOMY    ? ? ?There were no vitals filed for this visit. ? ? Subjective Assessment - 09/29/21 0933   ? ? Subjective Pt reports he is doing ok and that he is somewhat sore from the last session.   ? Patient is accompained by: Family member   ? Pertinent History Patient presents to PT for CVA. He presented to the ED after weakness on 08/15/21 causing him to fall; upon hospital discharge he was then admitted to inpatient rehab on 08/19/21 and discharged 08/30/21. PMH includes HTN, PVD, CKD, Parkinsonism, rectal cancer s/p resection with colostomy. Patient lives in a one story home with spouse and was independent with ADLs prior to hospitalization. Utilizes a rollator for mobility at baseline at night. His RLE was primarily affected.   ? Limitations Lifting;Standing;Walking;House hold activities   ? How long can you sit comfortably? n/a   ? How long can you stand comfortably? limited   ? How long  can you walk comfortably? 10 minutes with AD   ? Patient Stated Goals get RLE stronger, emprove endurance   ? Currently in Pain? Yes   ? Pain Score 6    ? Pain Location Shoulder   ? Pain Orientation Right   ? Pain Descriptors / Indicators Aching;Sore   ? Pain Type Chronic pain   ? Pain Onset More than a month ago   ? ?  ?  ? ?  ? ? ? ? ? ? ? ? ? ? ? ? ? ? ? ? ? ?Exercise/Activity Sets/ Reps/Time/ Resistance Assistance Charge type Comments  ?Seated hamstring curl  X 15 ea RTB CGA/ U UE  Therex Patient reports "challenging" - difficulty completing through full ROM- fatigues quickly  ?Semitandem on airex  2*30 sec ea  CGA-MinA throughout for balance.  Neuro re-ed [place] ankle strategy- Shaky- min reach for Railing in // bars today.   ?Step up to 4 inch step  5 on ea LE  B UE  Therex  Pt rated med- hard difficulty   ?Calf raises in standing x10   Therex  VC to maintain position  ?Seated LAQ while sitting on airex pad 2x10 ea LE with 4# ankle weight    Therex    ?Seated toe raises while sitting on airex pad  2x10 ea LE with 4# ankle weight  Therex   ?Sit to stand with airex pad on seat for increased height - BUE required for support 2X10 reps B UE support necessary  Therex Pt required UE support due to weakness in the LEs  ?Ambulation around gym with vertical and horizontal head turns 162f FWW with B UE support Neuro Re-ed   ?  ? ? ? ? ? ? ? ? ? PT Short Term Goals - 09/06/21 1212   ? ?  ? PT SHORT TERM GOAL #1  ? Title Patient will be independent in home exercise program to improve strength/mobility for better functional independence with ADLs.   ? Baseline 3/14: HEP given   ? Time 4   ? Period Weeks   ? Status New   ? Target Date 10/04/21   ? ?  ?  ? ?  ? ? ? ? PT Long Term Goals - 09/06/21 1212   ? ?  ? PT LONG TERM GOAL #1  ? Title Patient will increase FOTO score to equal to or greater than  67%   to demonstrate statistically significant improvement in mobility and quality of life.   ? Baseline 3/14: 52%   ? Time 12    ? Period Weeks   ? Status New   ? Target Date 11/29/21   ?  ? PT LONG TERM GOAL #2  ? Title Patient (> 669years old) will complete five times sit to stand test in < 15 seconds without UE support indicating an increased LE strength and improved balance.   ? Baseline 3/14: 20.4 seconds with BUE support   ? Time 12   ? Period Weeks   ? Status New   ? Target Date 11/29/21   ?  ? PT LONG TERM GOAL #3  ? Title Patient will increase Berg Balance score by > 6 points (26/56) to demonstrate decreased fall risk during functional activities.   ? Baseline 3/14: 20/56   ? Time 12   ? Period Weeks   ? Status New   ? Target Date 11/29/21   ?  ? PT LONG TERM GOAL #4  ? Title Patient will increase 10 meter walk test to >1.070m as to improve gait speed for better community ambulation and to reduce fall risk   ? Baseline 3/14: 0.36 m/s with RW   ? Time 12   ? Period Weeks   ? Status New   ? Target Date 11/29/21   ?  ? PT LONG TERM GOAL #5  ? Title Patient will increase BLE gross strength to 4+/5 as to improve functional strength for independent gait, increased standing tolerance and increased ADL ability   ? Baseline 3/14: see note   ? Time 12   ? Period Weeks   ? Status New   ? Target Date 11/29/21   ? ?  ?  ? ?  ? ? ? ? ? ? ? ? Plan - 09/29/21 0934   ? ? Clinical Impression Statement Pt put forth good effort throughout therapy session and was able to increase reps and perform standing calf raises with good technique applied.  Pt continues to require therapeutic rest breaks between exercises which is the main limiting factor during sessions.  Patient will continue to benefit from skilled physical therapy to improve his strength, mobility, balance, and overall functional capacity in order to return to PLOF.   ? Personal Factors and Comorbidities Age;Comorbidity 3+;Fitness;Past/Current Experience;Transportation   ?  Comorbidities HTN, PVD, CKD, Parkinsonism, rectal cancer s/p resection with colostomy   ? Examination-Activity  Limitations Bathing;Bed Mobility;Bend;Caring for [employer];Locomotion Level;Lift;Squat;Stairs;Stand;Toileting;Transfers   ? Examination-Participation Restrictions Cleaning;Community Activity;Meal Prep;Laundry;Shop;Volunteer;[place] Work   ? Stability/Clinical Decision Making Evolving/Moderate complexity   ? Rehab Potential Fair   ? PT Frequency 2x / week   ? PT Duration 12 weeks   ? PT Treatment/Interventions ADLs/Self Care Home Management;Canalith Repostioning;Cryotherapy;Electrical Stimulation;Moist Heat;Traction;Ultrasound;Contrast Bath;DME Instruction;Gait training;Stair training;Functional mobility training;Therapeutic activities;Therapeutic exercise;Balance training;Neuromuscular re-education;Patient/family education;Orthotic Fit/Training;Manual techniques;Passive range of motion;Vestibular;Taping;Energy conservation;Dry needling;Visual/perceptual remediation/compensation   ? PT Next Visit Plan strength, RLE stabilization, balance   ? PT Home Exercise Plan no changes this session   ? Consulted and Agree with Plan of Care Patient;Family member/caregiver   ? Family Member Consulted wife   ? ?  ?  ? ?  ? ? ?Patient will benefit from skilled therapeutic intervention in order to improve the following deficits and impairments:  Abnormal gait, Decreased activity tolerance, Cardiopulmonary status limiting activity, Decreased balance, Decreased knowledge of precautions, Decreased endurance, Decreased coordination, Decreased knowledge of use of DME, Decreased mobility, Difficulty walking, Decreased strength, Impaired flexibility, Impaired perceived functional ability, Impaired tone, Impaired UE functional use, Postural dysfunction, Improper body mechanics, Pain ? ?Visit Diagnosis: ?Difficulty in walking, not elsewhere classified ? ?Muscle weakness (generalized) ? ? ? ? ?Problem List ?Patient Active Problem List  ? Diagnosis Date Noted  ? Parkinson disease ([place]) 08/31/2021  ? BPH (benign prostatic  hyperplasia) 08/31/2021  ? Stage 3 chronic kidney disease ([place])   ? Essential hypertension   ? 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 ? ?Gwenlyn Saran, PT, DPT ?09/29/21, 4:29 PM ? ? ?[place] ?[facility]

## 2021-09-30 ENCOUNTER — Encounter: Payer: Self-pay | Admitting: Occupational Therapy

## 2021-09-30 NOTE — Therapy (Signed)
[facility] ?[place], [place], [address] ?Phone: [phone]   Fax:  [phone] ? ?Occupational Therapy Treatment ? ?Patient Details  ?Name: Justin Mann ?MRN: [id number] ?Date of Birth: 12-16-1945 ?No data recorded ? ?Encounter Date: 09/29/2021 ? ? OT End of Session - 09/30/21 0951   ? ? Visit Number 8   ? Number of Visits 24   ? Date for OT Re-Evaluation 11/29/21   ? OT Start Time 1015   ? OT Stop Time 1100   ? OT Time Calculation (min) 45 min   ? Activity Tolerance Patient tolerated treatment well   ? Behavior During Therapy [facility] for tasks assessed/performed   ? ?  ?  ? ?  ? ? ?Past Medical History:  ?Diagnosis Date  ? Agent orange exposure   ? Diabetes mellitus without complication ([place])   ? Hypertension   ? Neuropathy   ? Rectal cancer ([place])   ? ? ?Past Surgical History:  ?Procedure Laterality Date  ? ILEOSTOMY    ? TONSILLECTOMY    ? ? ?There were no vitals filed for this visit. ? ? Subjective Assessment - 09/30/21 0950   ? ? Subjective  Pt reports his right shoulder is ?barking? today, meaning it has more soreness and tenderness from use.  Requests to work more on hand than shoulder today. Rates Pain 6/10. ?I may have forgotten to take my Carbidopa this morning.  I took the first dose but maybe not the second dose yet. ?   ? Pertinent History Pt. presented to the ED after weakness on 08/15/21 causing him to fall; upon hospital discharge he was then admitted to inpatient rehab on 08/19/21 and discharged 08/30/21. PMH includes HTN, PVD, CKD, Parkinsonism, rectal cancer s/p resection with colostomy. Patient lives in a one story home with spouse and was independent with ADLs prior to hospitalization. Utilizes a rollator for mobility at baseline at night.   ? Patient Stated Goals Pt reports he would like to get back to normal and not relying on wife for everything.   ? Currently in Pain? Yes   ? Pain Score 6    ? Pain Location Shoulder   ? Pain  Orientation Right   ? Pain Descriptors / Indicators Aching;Sore   ? Pain Type Chronic pain   ? Pain Onset More than a month ago   ? Pain Frequency Constant   ? ?  ?  ? ?  ? ? ?Pt reports his right shoulder is ?barking? today, meaning it has more soreness and tenderness from use.  Requests to work more on hand than shoulder today. Rates Pain 6/10. ?I may have forgotten to take my Carbidopa this morning.  I took the first dose but maybe not the second dose yet. ? ?Moist heat to right shoulder to decrease pain, increase tissue mobility and improve ROM, 5 mins while performing coordination tasks at tabletop.  Followed by manual massage from therapist to shoulder and bicep area. ? ? ?Neuromuscular Reeducation: ?Manipulation of small glass beads from tabletop, some with flat sides down, others with flat sides up.  Cues for prehension patterns when attempting to pick up items and directed to move through the hand to the palm, using the hand for storage.  Able to store up to 6 items at a time but dropping items occasionally from palm.  Pt moving items back to fingertips to return to container.  Manipulation of small perler beads, attempting to  pick up with fingers but with some difficulty, directed to try placing with tweezers and removing.  Task required increased focus, dexterity and precision.  Pt with some tremoring this date during task and reported he wasn't sure he took his second dose of medication before coming to therapy.  Manipulation of nuts and bolts large in size to remove and replace onto work bench.  Difficulty with one of the bolts to untighten.  Pt able to utilize right hand for placing and removing nuts onto bolts while using left hand to stabilize object.  Completed 2 rows this date of removing, one row of replacing.  ? ? ?Response to tx: ?Pt with increased pain and soreness in right UE this date and feels he may have overworked it last session.  Focused this session on fine motor coordination  tasks/dexterity.  Pt requires occasional cues for prehension patterns with right and continues to work on speed of tasks.  Some increased tremors in hand today when attempting task which involve increased precision and feels he may have not taken his second dose of carbidopa levodopa.  Continue to work towards goals in plan of care to improve RUE function post CVA.  ? ? ? ? ? ? ? ? ? ? ? ? ? ? ? ? ? ? ? ? OT Education - 09/30/21 0951   ? ? Education Details UE ROM/strengthening, coordination, prehension patterns   ? Person(s) Educated Patient;Spouse   ? Methods Explanation;Demonstration;Verbal cues   ? Comprehension Verbalized understanding;Returned demonstration;Verbal cues required   ? ?  ?  ? ?  ? ? ? ? ? ? OT Long Term Goals - 09/13/21 0915   ? ?  ? OT LONG TERM GOAL #1  ? Title Pt will demonstrate independence with home exercise program   ? Baseline no current program   ? Time 12   ? Period Weeks   ? Status New   ? Target Date 11/29/21   ?  ? OT LONG TERM GOAL #2  ? Title Patient will improve active range of motion of right shoulder for flexion by 15 degrees to be able to reach in the closet to access clothing with modified independence.   ? Baseline Difficulty with reaching for items with right upper extremity   ? Time 12   ? Period Weeks   ? Status New   ? Target Date 11/29/21   ?  ? OT LONG TERM GOAL #3  ? Title Patient will improve right grip strength by 10 pounds to be able to grasp and lift items to move from 1 surface to another with modified independence.   ? Baseline Grip strength on the right 45 pounds   ? Time 12   ? Period Weeks   ? Status New   ? Target Date 11/29/21   ?  ? OT LONG TERM GOAL #4  ? Title Patient to demonstrate activity tolerance of 15 minutes with no rest breaks during ADL/IADL tasks   ? Baseline Patient fatigues easily and requires frequent rest breaks at eval   ? Time 6   ? Period Weeks   ? Status New   ? Target Date 10/18/21   ?  ? OT LONG TERM GOAL #5  ? Title Patient will  improve Foto score to 79 or better to show a clinical relevant change in function to impact his level of independence with ADL and IADL tasks   ? Baseline Eval: 66   ? Time 12   ?  Period Weeks   ? Status New   ? Target Date 11/29/21   ?  ? Long Term Additional Goals  ? Additional Long Term Goals Yes   ?  ? OT LONG TERM GOAL #6  ? Title Patient will demonstrate ability to feed cats with modified independence including opening containers/packages and placing into bowl.   ? Baseline Eval requires assistance   ? Time 12   ? Period Weeks   ? Status New   ? Target Date 11/29/21   ?  ? OT LONG TERM GOAL #7  ? Title Patient will complete basic self-care tasks with modified independence   ? Baseline At eval requires occasional supervision to min assist   ? Time 6   ? Period Weeks   ? Status New   ? Target Date 10/18/21   ?  ? OT LONG TERM GOAL #8  ? Title Patient will demonstrate the ability to print and sign his name with 90% or better legibility.   ? Baseline At eval decreased legibility, micrographia   ? Time 12   ? Period Weeks   ? Status New   ? Target Date 11/29/21   ? ?  ?  ? ?  ? ? ? ? ? ? ? ? Plan - 09/30/21 0952   ? ? Clinical Impression Statement Pt with increased pain and soreness in right UE this date and feels he may have overworked it last session.  Focused this session on fine motor coordination tasks/dexterity.  Pt requires occasional cues for prehension patterns with right and continues to work on speed of tasks.  Some increased tremors in hand today when attempting task which involve increased precision and feels he may have not taken his second dose of carbidopa levodopa.  Continue to work towards goals in plan of care to improve RUE function post CVA.   ? OT Occupational Profile and History Detailed Assessment- Review of Records and additional review of physical, cognitive, psychosocial history related to current functional performance   ? Occupational performance deficits (Please refer to evaluation for  details): ADL's;IADL's;Leisure   ? Body Structure / Function / Physical Skills ADL;Coordination;Endurance;GMC;UE functional use;Balance;Decreased knowledge of use of DME;Flexibility;IADL;Pain;Vision;Dexterity;FMC;Strength;

## 2021-10-03 ENCOUNTER — Ambulatory Visit: Payer: PPO | Admitting: Occupational Therapy

## 2021-10-03 ENCOUNTER — Ambulatory Visit: Payer: PPO | Admitting: Physical Therapy

## 2021-10-03 DIAGNOSIS — R2681 Unsteadiness on feet: Secondary | ICD-10-CM

## 2021-10-03 DIAGNOSIS — R262 Difficulty in walking, not elsewhere classified: Secondary | ICD-10-CM

## 2021-10-03 DIAGNOSIS — R278 Other lack of coordination: Secondary | ICD-10-CM

## 2021-10-03 DIAGNOSIS — G8929 Other chronic pain: Secondary | ICD-10-CM

## 2021-10-03 DIAGNOSIS — R2689 Other abnormalities of gait and mobility: Secondary | ICD-10-CM

## 2021-10-03 DIAGNOSIS — M6281 Muscle weakness (generalized): Secondary | ICD-10-CM

## 2021-10-03 NOTE — Therapy (Signed)
[facility] ?[place], [place], [address] ?Phone: [phone]   Fax:  [phone] ? ?Physical Therapy Treatment ? ?Patient Details  ?Name: Justin Mann ?MRN: [id number] ?Date of Birth: 05/06/1946 ?Referring Provider (PT): Angiulli, Quillian Quince ? ? ?Encounter Date: 10/03/2021 ? ? PT End of Session - 10/03/21 1275   ? ? Visit Number 9   ? Number of Visits 24   ? Date for PT Re-Evaluation 11/29/21   ? Authorization Type 1/10 eval 09/06/21   ? PT Start Time [phone]   ? PT Stop Time 1015   ? PT Time Calculation (min) 44 min   ? Equipment Utilized During Treatment Gait belt   ? Activity Tolerance Patient limited by fatigue;Patient tolerated treatment well   ? Behavior During Therapy [facility] for tasks assessed/performed   ? ?  ?  ? ?  ? ? ?Past Medical History:  ?Diagnosis Date  ? Agent orange exposure   ? Diabetes mellitus without complication ([place])   ? Hypertension   ? Neuropathy   ? Rectal cancer ([place])   ? ? ?Past Surgical History:  ?Procedure Laterality Date  ? ILEOSTOMY    ? TONSILLECTOMY    ? ? ?There were no vitals filed for this visit. ? ? Subjective Assessment - 10/03/21 0927   ? ? Patient is accompained by: Family member   ? Pertinent History Patient presents to PT for CVA. He presented to the ED after weakness on 08/15/21 causing him to fall; upon hospital discharge he was then admitted to inpatient rehab on 08/19/21 and discharged 08/30/21. PMH includes HTN, PVD, CKD, Parkinsonism, rectal cancer s/p resection with colostomy. Patient lives in a one story home with spouse and was independent with ADLs prior to hospitalization. Utilizes a rollator for mobility at baseline at night. His RLE was primarily affected.   ? Limitations Lifting;Standing;Walking;House hold activities   ? How long can you sit comfortably? n/a   ? How long can you stand comfortably? limited   ? How long can you walk comfortably? 10 minutes with AD   ? Patient Stated Goals get RLE stronger,  emprove endurance   ? Currently in Pain? Yes   ? Pain Score 6    ? Pain Location Shoulder   ? Pain Orientation Right   ? Pain Descriptors / Indicators Aching;Sore   ? Pain Type Chronic pain   ? Pain Onset More than a month ago   ? Pain Frequency Constant   ? ?  ?  ? ?  ? ? ? ? ?Exercise/Activity Sets/ Reps/Time/ Resistance Assistance Charge type Comments- Unless otherwise stated, CGA was provided and gait belt donned in order to ensure pt safety ?  ?Seated ham curl  X 15 ea RTB CGA/ U UE  Therex Patient reports "challenging" - difficulty completing through full ROM- fatigues quickly  ?Airex step up 2*10 CGA/ B UE on transition Therex Difficulty with airex step up.   ?Airex adducted stance  3 x 45 sec  CGA therex Difficulty maintain COM over BOS, intermittent UE support to recover balance.   ?Heel raises  2 X 10    Therex  VC to maintain position and to maintain full available ROM with increased reps ( around rep 7-8)  ?LAQ 2 x 10  w/4# AW  therex Increased difficulty on R LE   ?      ?Nustep  Level 2 x 5 min    therex Cues for cadence,  difficulty maintaining 50 SPM at increased intensity  ?  ?Ladder stepping  X 8 times through [place] support throughout  Gait training Cues for forward lean ( pt tends to lean posteriorlly)  ?      ? ? ?Treatment Provided this session  ? ?Pt educated throughout session about proper posture and technique with exercises. Improved exercise technique, movement at target joints, use of target muscles after min to mod verbal, visual, tactile cues. ?Note: Portions of this document were prepared using Dragon voice recognition software and although reviewed may contain unintentional dictation errors in syntax, grammar, or spelling.  ? ? ? ? ? ? ? ? ? ? ? ? ? ? ? ? ? ? ? ? ? ? ? ? ? ? ? ? ? PT Education - 10/03/21 1151   ? ? Education Details Exercise form and technique   ? Person(s) Educated Patient   ? Methods Explanation   ? Comprehension Verbalized understanding;Returned demonstration   ? ?  ?   ? ?  ? ? ? PT Short Term Goals - 09/06/21 1212   ? ?  ? PT SHORT TERM GOAL #1  ? Title Patient will be independent in home exercise program to improve strength/mobility for better functional independence with ADLs.   ? Baseline 3/14: HEP given   ? Time 4   ? Period Weeks   ? Status New   ? Target Date 10/04/21   ? ?  ?  ? ?  ? ? ? ? PT Long Term Goals - 09/06/21 1212   ? ?  ? PT LONG TERM GOAL #1  ? Title Patient will increase FOTO score to equal to or greater than  67%   to demonstrate statistically significant improvement in mobility and quality of life.   ? Baseline 3/14: 52%   ? Time 12   ? Period Weeks   ? Status New   ? Target Date 11/29/21   ?  ? PT LONG TERM GOAL #2  ? Title Patient (> 29 years old) will complete five times sit to stand test in < 15 seconds without UE support indicating an increased LE strength and improved balance.   ? Baseline 3/14: 20.4 seconds with BUE support   ? Time 12   ? Period Weeks   ? Status New   ? Target Date 11/29/21   ?  ? PT LONG TERM GOAL #3  ? Title Patient will increase Berg Balance score by > 6 points (26/56) to demonstrate decreased fall risk during functional activities.   ? Baseline 3/14: 20/56   ? Time 12   ? Period Weeks   ? Status New   ? Target Date 11/29/21   ?  ? PT LONG TERM GOAL #4  ? Title Patient will increase 10 meter walk test to >1.74ms as to improve gait speed for better community ambulation and to reduce fall risk   ? Baseline 3/14: 0.36 m/s with RW   ? Time 12   ? Period Weeks   ? Status New   ? Target Date 11/29/21   ?  ? PT LONG TERM GOAL #5  ? Title Patient will increase BLE gross strength to 4+/5 as to improve functional strength for independent gait, increased standing tolerance and increased ADL ability   ? Baseline 3/14: see note   ? Time 12   ? Period Weeks   ? Status New   ? Target Date 11/29/21   ? ?  ?  ? ?  ? ? ? ? ? ? ? ?  Plan - 10/03/21 0939   ? ? Clinical Impression Statement Patient demonstrates good motivation for completion of  physical therapy session. Patient's main limiting factor continues to be fatigue requires rest breaks between exercises. Pt demonstrates improved ankle muscle activation but demonstrated fatigue around repetition 8 where his ROM was effected. Pt also progressed with activities involving ladder stepping to improve step length. Pt required cues for proper weight distributin with this task but it improved with practice. Patient will continue to benefit from skilled physical therapy intervention to improve his strength, mobility, balance, and overall functional capacity.   ? Personal Factors and Comorbidities Age;Comorbidity 3+;Fitness;Past/Current Experience;Transportation   ? Comorbidities HTN, PVD, CKD, Parkinsonism, rectal cancer s/p resection with colostomy   ? Examination-Activity Limitations Bathing;Bed Mobility;Bend;Caring for [employer];Locomotion Level;Lift;Squat;Stairs;Stand;Toileting;Transfers   ? Examination-Participation Restrictions Cleaning;Community Activity;Meal Prep;Laundry;Shop;Volunteer;[employer] Work   ? Stability/Clinical Decision Making Evolving/Moderate complexity   ? Rehab Potential Fair   ? PT Frequency 2x / week   ? PT Duration 12 weeks   ? PT Treatment/Interventions ADLs/Self Care Home Management;Canalith Repostioning;Cryotherapy;Electrical Stimulation;Moist Heat;Traction;Ultrasound;Contrast Bath;DME Instruction;Gait training;Stair training;Functional mobility training;Therapeutic activities;Therapeutic exercise;Balance training;Neuromuscular re-education;Patient/family education;Orthotic Fit/Training;Manual techniques;Passive range of motion;Vestibular;Taping;Energy conservation;Dry needling;Visual/perceptual remediation/compensation   ? PT Next Visit Plan strength, RLE stabilization, balance   ? PT Home Exercise Plan no changes this session   ? Consulted and Agree with Plan of Care Patient;Family member/caregiver   ? Family Member Consulted wife   ? ?  ?  ? ?   ? ? ?Patient will benefit from skilled therapeutic intervention in order to improve the following deficits and impairments:  Abnormal gait, Decreased activity tolerance, Cardiopulmonary status limiting activity, Decrea

## 2021-10-05 ENCOUNTER — Encounter: Payer: Self-pay | Admitting: Occupational Therapy

## 2021-10-05 ENCOUNTER — Other Ambulatory Visit: Payer: Self-pay | Admitting: Physical Medicine and Rehabilitation

## 2021-10-05 NOTE — Therapy (Signed)
[facility] ?[place], [place], [address] ?Phone: [phone]   Fax:  [phone] ? ?Occupational Therapy Treatment ? ?Patient Details  ?Name: Justin Mann ?MRN: [id number] ?Date of Birth: 01-Jul-1945 ?No data recorded ? ?Encounter Date: 10/03/2021 ? ? OT End of Session - 10/05/21 1839   ? ? Visit Number 9   ? Number of Visits 24   ? Date for OT Re-Evaluation 11/29/21   ? OT Start Time 1016   ? OT Stop Time 1100   ? OT Time Calculation (min) 44 min   ? Activity Tolerance Patient tolerated treatment well   ? Behavior During Therapy [facility] for tasks assessed/performed   ? ?  ?  ? ?  ? ? ?Past Medical History:  ?Diagnosis Date  ? Agent orange exposure   ? Diabetes mellitus without complication ([place])   ? Hypertension   ? Neuropathy   ? Rectal cancer ([place])   ? ? ?Past Surgical History:  ?Procedure Laterality Date  ? ILEOSTOMY    ? TONSILLECTOMY    ? ? ?There were no vitals filed for this visit. ? ? Subjective Assessment - 10/05/21 1837   ? ? Subjective  Pt reports he still has 5/10 arm pain but its not as sharp or tender as it was last week.   ? Pertinent History Pt. presented to the ED after weakness on 08/15/21 causing him to fall; upon hospital discharge he was then admitted to inpatient rehab on 08/19/21 and discharged 08/30/21. PMH includes HTN, PVD, CKD, Parkinsonism, rectal cancer s/p resection with colostomy. Patient lives in a one story home with spouse and was independent with ADLs prior to hospitalization. Utilizes a rollator for mobility at baseline at night.   ? Currently in Pain? Yes   ? Pain Score 5    ? Pain Location Shoulder   ? Pain Orientation Right   ? Pain Descriptors / Indicators Aching;Sore   ? Pain Type Chronic pain   ? Pain Onset More than a month ago   ? Pain Frequency Constant   ? ?  ?  ? ?  ? ? ?Pt reports pain in right shoulder 5/10.  Had a good Easter, had friends and family over for the holiday.   ? ?Neuromuscular Reeducation:  ? ?Pt  seen for reaching tasks with placing items onto large Judy pegboard placed on a wedge to encourage increased reach.  Instructed to place 40 pegs and then worked to turn and flip each jumbo end to end to place into grid with white dot side up.  Cues for prehension tasks to hold items, isolated finger movements of index to turn and flip objects.  Manipulation of small ? inch sized metal objects to pick up and sort into containers,  occasional cues for prehension patterns.  Unable to place small silicone pieces onto paperclip, unable to see the hole and some tremoring noted with task.   ? ?Therapeutic Exercises: ? ?Pt seen for use of 1# dumbbell wrist strengthening for flexion/extension, ulnar and radial deviation for 10 reps for 2 sets each.  Therapist demo and cues for proper form and technique.  Rest breaks as needed.   ? ?Response to tx: ?Pt has continued to make good progress, remains limited by right chronic shoulder pain from past injury.  Pt's coordination improving, has tremors at times for tasks which require increased precision and items which are smaller than 1/2 inch in size.  Responds well to  cues for strengthening exercises.  Continue to work towards goals in plan of care to improve RUE function for use in ADL and IADL tasks.  Will plan to perform Progress Update next session.   ? ? ? ? ? ? ? ? ? ? ? ? ? ? ? ? ? ? ? ? OT Education - 10/05/21 1838   ? ? Education Details UE ROM/strengthening, coordination, prehension patterns   ? Person(s) Educated Patient;Spouse   ? Methods Explanation;Demonstration;Verbal cues   ? Comprehension Verbalized understanding;Returned demonstration;Verbal cues required   ? ?  ?  ? ?  ? ? ? ? ? ? OT Long Term Goals - 09/13/21 0915   ? ?  ? OT LONG TERM GOAL #1  ? Title Pt will demonstrate independence with home exercise program   ? Baseline no current program   ? Time 12   ? Period Weeks   ? Status New   ? Target Date 11/29/21   ?  ? OT LONG TERM GOAL #2  ? Title Patient will  improve active range of motion of right shoulder for flexion by 15 degrees to be able to reach in the closet to access clothing with modified independence.   ? Baseline Difficulty with reaching for items with right upper extremity   ? Time 12   ? Period Weeks   ? Status New   ? Target Date 11/29/21   ?  ? OT LONG TERM GOAL #3  ? Title Patient will improve right grip strength by 10 pounds to be able to grasp and lift items to move from 1 surface to another with modified independence.   ? Baseline Grip strength on the right 45 pounds   ? Time 12   ? Period Weeks   ? Status New   ? Target Date 11/29/21   ?  ? OT LONG TERM GOAL #4  ? Title Patient to demonstrate activity tolerance of 15 minutes with no rest breaks during ADL/IADL tasks   ? Baseline Patient fatigues easily and requires frequent rest breaks at eval   ? Time 6   ? Period Weeks   ? Status New   ? Target Date 10/18/21   ?  ? OT LONG TERM GOAL #5  ? Title Patient will improve Foto score to 79 or better to show a clinical relevant change in function to impact his level of independence with ADL and IADL tasks   ? Baseline Eval: 66   ? Time 12   ? Period Weeks   ? Status New   ? Target Date 11/29/21   ?  ? Long Term Additional Goals  ? Additional Long Term Goals Yes   ?  ? OT LONG TERM GOAL #6  ? Title Patient will demonstrate ability to feed cats with modified independence including opening containers/packages and placing into bowl.   ? Baseline Eval requires assistance   ? Time 12   ? Period Weeks   ? Status New   ? Target Date 11/29/21   ?  ? OT LONG TERM GOAL #7  ? Title Patient will complete basic self-care tasks with modified independence   ? Baseline At eval requires occasional supervision to min assist   ? Time 6   ? Period Weeks   ? Status New   ? Target Date 10/18/21   ?  ? OT LONG TERM GOAL #8  ? Title Patient will demonstrate the ability to print and sign his name with  90% or better legibility.   ? Baseline At eval decreased legibility, micrographia    ? Time 12   ? Period Weeks   ? Status New   ? Target Date 11/29/21   ? ?  ?  ? ?  ? ? ? ? ? ? ? ? Plan - 10/05/21 1839   ? ? Clinical Impression Statement Pt has continued to make good progress, remains limited by right chronic shoulder pain from past injury.  Pt's coordination improving, has tremors at times for tasks which require increased precision and items which are smaller than 1/2 inch in size.  Responds well to cues for strengthening exercises.  Continue to work towards goals in plan of care to improve RUE function for use in ADL and IADL tasks.  Will plan to perform Progress Update next session.   ? OT Occupational Profile and History Detailed Assessment- Review of Records and additional review of physical, cognitive, psychosocial history related to current functional performance   ? Occupational performance deficits (Please refer to evaluation for details): ADL's;IADL's;Leisure   ? Body Structure / Function / Physical Skills ADL;Coordination;Endurance;GMC;UE functional use;Balance;Decreased knowledge of use of DME;Flexibility;IADL;Pain;Vision;Dexterity;FMC;Strength;Gait;Mobility;ROM   ? Psychosocial Skills Habits;Environmental  Adaptations;Routines and Behaviors   ? Rehab Potential Good   ? Clinical Decision Making Several treatment options, min-mod task modification necessary   ? Comorbidities Affecting Occupational Performance: May have comorbidities impacting occupational performance   ? Modification or Assistance to Complete Evaluation  No modification of tasks or assist necessary to complete eval   ? OT Frequency 2x / week   ? OT Duration 12 weeks   ? OT Treatment/Interventions Self-care/ADL training;Cryotherapy;Paraffin;Therapeutic exercise;DME and/or AE instruction;Functional Mobility Training;Cognitive remediation/compensation;Balance training;Neuromuscular education;Manual Therapy;Visual/perceptual remediation/compensation;Psychosocial skills training;Moist Heat;Contrast Bath;Energy  conservation;Passive range of motion;Therapeutic activities;Patient/family education;Coping strategies training   ? Consulted and Agree with Plan of Care Patient;Family member/caregiver   ? Family Member Consult

## 2021-10-06 ENCOUNTER — Ambulatory Visit: Payer: PPO

## 2021-10-06 DIAGNOSIS — M6281 Muscle weakness (generalized): Secondary | ICD-10-CM

## 2021-10-06 DIAGNOSIS — R2681 Unsteadiness on feet: Secondary | ICD-10-CM | POA: Diagnosis not present

## 2021-10-06 DIAGNOSIS — R278 Other lack of coordination: Secondary | ICD-10-CM

## 2021-10-06 DIAGNOSIS — R262 Difficulty in walking, not elsewhere classified: Secondary | ICD-10-CM

## 2021-10-06 NOTE — Therapy (Signed)
[facility] ?[place], [place], [address] ?Phone: [phone]   Fax:  [phone] ? ?Physical Therapy Treatment/ Physical Therapy Progress Note ? ? ?Dates of reporting period  09/06/21   to   10/06/21  ? ?Patient Details  ?Name: Justin Mann ?MRN: [id number] ?Date of Birth: [birth year] ?Referring Provider (PT): Angiulli, Quillian Quince ? ? ?Encounter Date: 10/06/2021 ? ? PT End of Session - 10/06/21 1123   ? ? Visit Number 10   ? Number of Visits 24   ? Date for PT Re-Evaluation 11/29/21   ? Authorization Type 10/10 eval 09/06/21; PN 10/06/21   ? PT Start Time 1100   ? PT Stop Time 0263   ? PT Time Calculation (min) 43 min   ? Equipment Utilized During Treatment Gait belt   ? Activity Tolerance Patient limited by fatigue;Patient tolerated treatment well   ? Behavior During Therapy [facility] for tasks assessed/performed   ? ?  ?  ? ?  ? ? ?Past Medical History:  ?Diagnosis Date  ? Agent orange exposure   ? Diabetes mellitus without complication ([place])   ? Hypertension   ? Neuropathy   ? Rectal cancer ([place])   ? ? ?Past Surgical History:  ?Procedure Laterality Date  ? ILEOSTOMY    ? TONSILLECTOMY    ? ? ?There were no vitals filed for this visit. ? ? Subjective Assessment - 10/06/21 1128   ? ? Subjective Patient reports R shoulder pain 6/10 today. Patient reports he has intermittent compliance with HEP .  Patient had a fall last Thursday he reports.   ? Patient is accompained by: Family member   ? Pertinent History Patient presents to PT for CVA. He presented to the ED after weakness on 08/15/21 causing him to fall; upon hospital discharge he was then admitted to inpatient rehab on 08/19/21 and discharged 08/30/21. PMH includes HTN, PVD, CKD, Parkinsonism, rectal cancer s/p resection with colostomy. Patient lives in a one story home with spouse and was independent with ADLs prior to hospitalization. Utilizes a rollator for mobility at baseline at night. His RLE was primarily  affected.   ? Limitations Lifting;Standing;Walking;House hold activities   ? How long can you sit comfortably? n/a   ? How long can you stand comfortably? limited   ? How long can you walk comfortably? 10 minutes with AD   ? Patient Stated Goals get RLE stronger, emprove endurance   ? Currently in Pain? Yes   ? Pain Score 6    ? Pain Location Shoulder   ? Pain Orientation Right   ? Pain Descriptors / Indicators Aching   ? Pain Type Chronic pain   ? Pain Onset More than a month ago   ? Pain Frequency Constant   ? ?  ?  ? ?  ? ? ? ? ? [facility] PT Assessment - 10/06/21 0001   ? ?  ? Standardized Balance Assessment  ? Standardized Balance Assessment Berg Balance Test   ?  ? Berg Balance Test  ? Sit to Stand Able to stand  independently using hands   ? Standing Unsupported Able to stand 2 minutes with supervision   ? Sitting with Back Unsupported but Feet Supported on Floor or Stool Able to sit safely and securely 2 minutes   ? Stand to Sit Controls descent by using hands   ? Transfers Able to transfer safely, definite need of hands   ? Standing Unsupported with  Eyes Closed Able to stand 10 seconds with supervision   ? Standing Unsupported with Feet Together Able to place feet together independently but unable to hold for 30 seconds   ? From Standing, Reach Forward with Outstretched Arm Can reach forward >5 cm safely (2")   ? From Standing Position, Pick up Object from Floor Able to pick up shoe, needs supervision   ? From Standing Position, Turn to Look Behind Over each Shoulder Turn sideways only but maintains balance   ? Turn 360 Degrees Needs close supervision or verbal cueing   ? Standing Unsupported, Alternately Place Feet on Step/Stool Able to complete >2 steps/needs minimal assist   ? Standing Unsupported, One Foot in Front Needs help to step but can hold 15 seconds   ? Standing on One Leg Unable to try or needs assist to prevent fall   ? Total Score 31   ? ?  ?  ? ?  ?Patient reports R shoulder pain 6/10 today.  Patient reports he has intermittent compliance with HEP .  Patient had a fall last Thursday he reports.  ? ? ?Progress note ?FOTO: 49%  ?5xSTS: 14.96 seconds with BUE support  ?BERG: 31/56  ?10 MWT: 21 seconds=0.48 m/s  ?BLE strength: 4/5 ? ?Treatment:  ?Standing with # 4 ankle weight: CGA for stability ? ?-Hip extension with B upper extremity support, cueing for neutral hip alignment, upright posture for optimal muscle recruitment, and sequencing, 10x each LE,  ?-Hip abduction with B upper extremity support, cueing for neutral foot alignment for correct muscle activation, 10x each LE ?-Hip flexion with B upper extremity support, cueing for body mechanics, speed of muscle recruitment for optimal strengthening and stabilization 10x each LE ?-Hamstring curl with B upper extremity support, cueing for knee alignment for recruitment of hamstring musculature, 10x each LE ? ? ?Seated with # 4 ankle weights  ?-Seated marches with upright posture, back away from back of chair for abdominal/trunk activation/stabilization, 10x each LE ?-Seated LAQ with 3 second holds, 10x each LE, cueing for muscle activation and sequencing for neutral alignment ?-Seated IR/ER with cueing for stabilizing knee placement with lateral foot movement for optimal muscle recruitment, 10x each LE ?-20 heel raises  ? ?Ambulate 160 ft with RW with close CGA ?Seated 6" step x30 seconds ? ?Patient's condition has the potential to improve in response to therapy. Maximum improvement is yet to be obtained. The anticipated improvement is attainable and reasonable in a generally predictable time.  Patient reports he has noticed improvements with therapy but wants to finish therapy soon due to it disrupting his life.  ? ? ? ? ? ?Patient is making excellent progress towards functional goals, meeting his BERG goal. His gait speed and transfer speed are additionally improving. Patient's condition has the potential to improve in response to therapy. Maximum  improvement is yet to be obtained. The anticipated improvement is attainable and reasonable in a generally predictable time.  Patient reports he has noticed improvements with therapy but wants to finish therapy soon due to it disrupting his life. Patient will continue to benefit from skilled physical therapy intervention to improve his strength, mobility, balance, and overall functional capacity. ? ? ? ? ? ? ? ? ? ? ? ? ? ? ? ? PT Education - 10/06/21 1127   ? ? Education Details progress note, goals   ? Person(s) Educated Patient   ? Methods Explanation;Demonstration;Tactile cues;Verbal cues   ? Comprehension Verbalized understanding;Returned demonstration;Verbal cues required;Tactile cues required   ? ?  ?  ? ?  ? ? ?  PT Short Term Goals - 10/06/21 1122   ? ?  ? PT SHORT TERM GOAL #1  ? Title Patient will be independent in home exercise program to improve strength/mobility for better functional independence with ADLs.   ? Baseline 3/14: HEP given 4/13: intermittent compliance   ? Time 4   ? Period Weeks   ? Status On-going   ? Target Date 10/04/21   ? ?  ?  ? ?  ? ? ? ? PT Long Term Goals - 10/06/21 1109   ? ?  ? PT LONG TERM GOAL #1  ? Title Patient will increase FOTO score to equal to or greater than  67%   to demonstrate statistically significant improvement in mobility and quality of life.   ? Baseline 3/14: 52% 4/13: 49%   ? Time 12   ? Period Weeks   ? Status On-going   ? Target Date 11/29/21   ?  ? PT LONG TERM GOAL #2  ? Title Patient (> 8 years old) will complete five times sit to stand test in < 15 seconds without UE support indicating an increased LE strength and improved balance.   ? Baseline 3/14: 20.4 seconds with BUE support 4/13: 14.96 seconds with BUE support   ? Time 12   ? Period Weeks   ? Status Partially Met   ? Target Date 11/29/21   ?  ? PT LONG TERM GOAL #3  ? Title Patient will increase Berg Balance score by > 6 points (26/56) to demonstrate decreased fall risk during functional  activities.   ? Baseline 3/14: 20/56 4/13: 31/56   ? Time 12   ? Period Weeks   ? Status Achieved   ? Target Date 11/29/21   ?  ? PT LONG TERM GOAL #4  ? Title Patient will increase 10 meter walk test to >1.8ms as to impr

## 2021-10-07 NOTE — Therapy (Signed)
[facility] ?[place], [place], [address] ?Phone: [phone]   Fax:  [phone] ? ?Occupational Therapy Treatment/Progress Note ?Reporting period 09/06/21-10/06/21 ? ?Patient Details  ?Name: Justin Mann ?MRN: [id number] ?Date of Birth: 08-14-1945 ?No data recorded ? ?Encounter Date: 10/06/2021 ? ? OT End of Session - 10/07/21 1357   ? ? Visit Number 10   ? Number of Visits 24   ? Date for OT Re-Evaluation 11/29/21   ? Authorization Time Period Reporting period beginning 09/06/21-10/06/21   ? OT Start Time 1145   ? OT Stop Time 1230   ? OT Time Calculation (min) 45 min   ? Activity Tolerance Patient tolerated treatment well   ? Behavior During Therapy [facility] for tasks assessed/performed   ? ?  ?  ? ?  ? ? ?Past Medical History:  ?Diagnosis Date  ? Agent orange exposure   ? Diabetes mellitus without complication ([place])   ? Hypertension   ? Neuropathy   ? Rectal cancer ([place])   ? ? ?Past Surgical History:  ?Procedure Laterality Date  ? ILEOSTOMY    ? TONSILLECTOMY    ? ? ?There were no vitals filed for this visit. ? ? Subjective Assessment - 10/06/21 1348   ? ? Subjective  Pt reports his shoulder is always going to be painful and he has no plans for surgery.   ? Patient is accompanied by: Family member   ? Pertinent History Pt. presented to the ED after weakness on 08/15/21 causing him to fall; upon hospital discharge he was then admitted to inpatient rehab on 08/19/21 and discharged 08/30/21. PMH includes HTN, PVD, CKD, Parkinsonism, rectal cancer s/p resection with colostomy. Patient lives in a one story home with spouse and was independent with ADLs prior to hospitalization. Utilizes a rollator for mobility at baseline at night.   ? Patient Stated Goals Pt reports he would like to get back to normal and not relying on wife for everything.   ? Currently in Pain? Yes   ? Pain Score 6    ? Pain Location Shoulder   ? Pain Orientation Right   ? Pain Descriptors /  Indicators Aching   ? Pain Type Chronic pain   ? Pain Onset More than a month ago   ? Pain Frequency Constant   ? Aggravating Factors  UE activity   ? Pain Relieving Factors meds   ? Effect of Pain on Daily Activities difficulty with performing ADLs   ? Multiple Pain Sites No   ? ?  ?  ? ?  ? ?Occupational Therapy Treatment/Progress Note: ?Therapeutic Exercise: ?Objective measures taken and goals updated.  OT issued green theraputty and instructed in gross grasping and pinching exercises with good return demo.  Encouraged pt complete 1-2x daily for 5-10 min to help with improving hand strength for better manipulation of ADL supplies.  Pt verbalized understanding.  Instructed pt in R shoulder pendulum swings front/back/side-side/circles clockwise and counterclockwise and table slides for shoulder flex/abd/horiz abd/add to tolerance; good return demo with min vc and tactile cues.  Encouraged pt to perform regularly at home within pain-free range, working to maintain R shoulder mobility for ADLs.  Pt verbalized understanding but likely needs additional reinforcement for understanding the benefits, as pt states, "My shoulder hurts all the time and it always will."  ? ?Therapeutic Activity: ?Participated in functional reaching activity to simulate pet care.  Pt moved cones from countertop level to floor  level using 1 hand on RW for support and initial CGA from OT d/t pt reports frequent vertigo with low reaching, but was able to increase to SBA and no reports of vertigo.  Pt reports he typically does not hold to his walker in the kitchen where he feeds his cat; OT made recommendation to position RW nearby for unilateral support when reaching down to floor level in order to decrease fall risk.  Pt was then able to gather cones from floor one at a time and move back to countertop with SBA.  Performed with 6 cones x2 trials.  Pt participated in dynamic standing balance with medium size light weight ball at waist and chest  height.  Cues for wide BOS.  Pt able to perform with close supv and frequent balance checks.  Pt then held ball to perform trunk rotation R/L, OT providing min guard d/t no UE support on walker; completed 10 reps side to side without LOB but slow/guarded rotations.  ? ?Response to Treatment: ?Pt making steady gains towards OT goals with noted improvement in FOTO score from 66 to 76.  Pt has made improvements with R grip strength and 9 hole test bilaterally, see note for details.  Pt verbalized eagerness to be done with therapy all together when he is recommended for discharge.  Pt continues to present with balance deficits which increase fall risk, and still a noted decline from baseline since CVA.  OT provided education as to benefits of therapy to address balance deficits, but further reinforcement is needed.  Pt does not feel like his UE function has changed since his stroke, but deficits are present at minimum from hx of PD, and pt will benefit from continued skilled OT to teach compensatory strategies for improving manipulation of ADL supplies and improving handwriting legibility.  HEP was initiated this day for gentle pendulums and table slides to address R shoulder pain and immobility.  Pt tolerated well today.  Pt seemed more engaged when addressing dynamic standing tasks this day, and he does verbalize his balance to be his main issue.  Pt will continue to benefit from skilled OT to address above noted deficits in order to maximize safety and efficiency with daily tasks.  ? ? ? [facility] OT Assessment - 10/07/21 0001   ? ?  ? Assessment  ? Medical Diagnosis CVA   ? Onset Date/Surgical Date 08/15/21   ? Hand Dominance Right   ?  ? Observation/Other Assessments  ? Focus on Therapeutic Outcomes (FOTO)  76   ?  ? Coordination  ? Right 9 Hole Peg Test 30 sec   ? Left 9 Hole Peg Test 31 sec   ? Tremors tremors present at times bilaterally, worse on right than left.   ?  ? AROM  ? Overall AROM  Deficits   ? Overall  AROM Comments R shoulder active flex 0-116   ?  ? Strength  ? Overall Strength Comments Pt reports previous injury to right arm in the past from snow shoveling.  Strength right UE 3-/5 overall, left 4/5 overall   ?  ? Hand Function  ? Right Hand Grip (lbs) 50   ? Left Hand Grip (lbs) 51   ? ?  ?  ? ?  ? ? ? ? ? ? ? OT Education - 10/06/21 1356   ? ? Education Details pendullum swings and table slides for R shoulder pain/mobility   ? Person(s) Educated Patient   ? Methods Explanation;Demonstration;Verbal cues;Tactile  cues   ? Comprehension Verbalized understanding;Returned demonstration;Verbal cues required;Need further instruction   ? ?  ?  ? ?  ? ? ? ? ? ? OT Long Term Goals - 10/06/21 1146   ? ?  ? OT LONG TERM GOAL #1  ? Title Pt will demonstrate independence with home exercise program   ? Baseline Eval: no current program; 10/06/21: issued green theraputty and began pendullum swings and table slides today, further training needed   ? Time 12   ? Period Weeks   ? Status On-going   ? Target Date 11/29/21   ?  ? OT LONG TERM GOAL #2  ? Title Patient will improve active range of motion of right shoulder for flexion by 15 degrees to be able to reach in the closet to access clothing with modified independence.   ? Baseline Eval: Difficulty with reaching for items with right upper extremity (R shd flex 110); 10/06/21: R shoulder flex 116 and pain with overhead reach   ? Time 12   ? Period Weeks   ? Status On-going   ? Target Date 11/29/21   ?  ? OT LONG TERM GOAL #3  ? Title Patient will improve right grip strength by 10 pounds to be able to grasp and lift items to move from 1 surface to another with modified independence.   ? Baseline Eval: Grip strength on the right 45 pounds; 10/06/21: R 50 lbs   ? Time 12   ? Period Weeks   ? Status On-going   ? Target Date 11/29/21   ?  ? OT LONG TERM GOAL #4  ? Title Patient to demonstrate activity tolerance of 15 minutes with no rest breaks during ADL/IADL tasks   ? Baseline Eval:  Patient fatigues easily and requires frequent rest breaks at eval; 10/06/21: Fatigues quickly/frequent rest breaks   ? Time 6   ? Period Weeks   ? Status On-going   ? Target Date 10/18/21   ?  ? OT LONG TERM

## 2021-10-11 ENCOUNTER — Ambulatory Visit: Payer: PPO | Admitting: Occupational Therapy

## 2021-10-11 ENCOUNTER — Ambulatory Visit: Payer: PPO | Admitting: Physical Therapy

## 2021-10-13 ENCOUNTER — Ambulatory Visit: Payer: PPO

## 2021-10-14 ENCOUNTER — Encounter: Payer: Self-pay | Admitting: Physical Medicine & Rehabilitation

## 2021-10-14 ENCOUNTER — Encounter: Payer: PPO | Attending: Physical Medicine & Rehabilitation | Admitting: Physical Medicine & Rehabilitation

## 2021-10-14 VITALS — BP 157/74 | HR 61 | Ht 70.0 in | Wt 200.0 lb

## 2021-10-14 DIAGNOSIS — I639 Cerebral infarction, unspecified: Secondary | ICD-10-CM

## 2021-10-14 NOTE — Progress Notes (Signed)
? ?Subjective:  ? ? Patient ID: Justin Mann, male    DOB: 1945/10/10, 76 y.o.   MRN: [id number] ? 76 y.o. male with history of HTN, PVD, CKD, rectal cancer status post resection with colostomy who was admitted on 08/16/2020 with right-sided weakness and difficulty walking.  MRI/MRA brain done revealing small acute infarcts in left parietal corona radiata and subcortical white matter.  Neurology was consulted for input and recommended DAPT x3 weeks followed by Plavix alone.  ?He is able to complete ADL tasks with supervision.  He requires supervision for transfers and CGA to ambulate 75-100 feet with rolling walker  ?Admit date: 08/19/2021 ?Discharge date: 08/30/2021 ?HPI ?1-2 falls , last night tripped on walker , no injury  ?No longer requiring CGA for ambulation  ?Mod I ADLs ?Outpt PT, OT stopped last week, pt did not feel like he was making progress. ?Sees PCP and Neurologist next month  ? ?No right sided sensation problems  ?Still feels a little week ?Last PT note RW 160' ?Pain Inventory ?Average Pain 6 ?Pain Right Now 6 ?My pain is constant and sharp ? ?LOCATION OF PAIN  Right shoulder  ? ?BOWEL ? ?History of colostomy Yes  ? ? ?BLADDER ?Normal ? ? ? ?Mobility ?walk without assistance ?use a walker ?how many minutes can you walk? 10 mins ?ability to climb steps?  yes ?do you drive?  no ?Do you have any goals in this area?  yes ? ?Function ?retired ?Do you have any goals in this area?  yes ? ?Neuro/Psych ?bladder control problems ?numbness ?tremor ?tingling ?trouble walking ? ?Prior Studies ?Any changes since last visit?  no ? ?Physicians involved in your care ?Any changes since last visit?  no ? ? ?Family History  ?Problem Relation Age of Onset  ? Hypertension Father   ? Heart attack Father   ? Stroke Sister   ? Hypertension Sister   ? ?Social History  ? ?Socioeconomic History  ? Marital status: Married  ?  Spouse name: Not on file  ? Number of children: Not on file  ? Years of education: Not on file  ? Highest  education level: Not on file  ?Occupational History  ? Not on file  ?Tobacco Use  ? Smoking status: Never  ? Smokeless tobacco: Not on file  ?Vaping Use  ? Vaping Use: Never used  ?Substance and Sexual Activity  ? Alcohol use: Yes  ?  Comment: occasionally   ? Drug use: Not Currently  ? Sexual activity: Not on file  ?Other Topics Concern  ? Not on file  ?Social History Narrative  ? Not on file  ? ?Social Determinants of Health  ? ?Financial Resource Strain: Not on file  ?Food Insecurity: Not on file  ?Transportation Needs: Not on file  ?Physical Activity: Not on file  ?Stress: Not on file  ?Social Connections: Not on file  ? ?Past Surgical History:  ?Procedure Laterality Date  ? ILEOSTOMY    ? TONSILLECTOMY    ? ?Past Medical History:  ?Diagnosis Date  ? Agent orange exposure   ? Diabetes mellitus without complication ([place])   ? Hypertension   ? Neuropathy   ? Rectal cancer ([place])   ? ?BP (!) 172/77   Pulse 66   Ht '5\' 10"'$  (1.778 m)   Wt 200 lb (90.7 kg)   SpO2 97%   BMI 28.70 kg/m?  ? ?Opioid Risk Score:   ?Fall Risk Score:  `1 ? ?Depression screen PHQ 2/9 ? ? ?  10/14/2021  ? 12:54 PM  ?Depression screen PHQ 2/9  ?Decreased Interest 0  ?Down, Depressed, Hopeless 0  ?PHQ - 2 Score 0  ?Altered sleeping 0  ?Tired, decreased energy 0  ?Change in appetite 0  ?Feeling bad or failure about yourself  0  ?Trouble concentrating 0  ?Moving slowly or fidgety/restless 0  ?Suicidal thoughts 0  ?PHQ-9 Score 0  ?  ?Review of Systems  ?Musculoskeletal:  Positive for gait problem.  ?     Pain in right shoulder  ?Neurological:  Positive for tremors and numbness.  ?All other systems reviewed and are negative. ? ?   ?Objective:  ? Physical Exam ?Vitals and nursing note reviewed.  ?Neurological:  ?   Mental Status: He is alert and oriented to person, place, and time.  ?   Cranial Nerves: No dysarthria.  ?   Sensory: Sensory deficit present.  ?   Coordination: Impaired rapid alternating movements.  ?   Gait: Gait abnormal.  ?    Comments: Motor strength is 5/5 bilateral deltoid, bicep, tricep, grip, hip flexion, knee extensor, 3 - right ankle dorsiflexor 4 at the left ankle dorsiflexor ?Sensation reduced in bilateral feet ?Negative straight leg raising ?Tone is normal in both upper and lower limbs ?Ambulates with a walker mild right foot drop  ?Psychiatric:     ?   Mood and Affect: Mood normal.     ?   Behavior: Behavior normal.  ? ? ? ? ? ?   ?Assessment & Plan:  ? ?1.  History of subcortical CVA left side no significant weakness except for right foot drop which may be partially due to severe diabetic neuropathy. ?I think he is plateauing in his functioning and I do not think he needs to proceed with outpatient therapy any longer.  We did discuss the potential utility of ankle-foot orthoses but he does not think he would be wearing these.  We discussed that as his neuropathy worsens this may become more of an issue and particularly if he finds himself having difficulty clearing his right foot, 1 would be strongly recommended. ?I will see him back on a as needed basis ?He will follow-up with neurology at the [facility] ?

## 2021-10-18 ENCOUNTER — Encounter: Payer: PPO | Admitting: Occupational Therapy

## 2021-10-19 ENCOUNTER — Encounter: Payer: PPO | Admitting: Occupational Therapy

## 2021-10-19 ENCOUNTER — Ambulatory Visit: Payer: PPO | Admitting: Physical Therapy

## 2021-10-21 ENCOUNTER — Encounter: Payer: PPO | Admitting: Occupational Therapy

## 2021-10-25 ENCOUNTER — Ambulatory Visit: Payer: PPO | Admitting: Physical Therapy

## 2021-10-25 ENCOUNTER — Encounter: Payer: PPO | Admitting: Occupational Therapy

## 2021-10-28 ENCOUNTER — Ambulatory Visit: Payer: PPO | Admitting: Physical Therapy

## 2021-10-28 ENCOUNTER — Encounter: Payer: PPO | Admitting: Occupational Therapy

## 2021-11-01 ENCOUNTER — Ambulatory Visit: Payer: PPO

## 2021-11-01 ENCOUNTER — Encounter: Payer: PPO | Admitting: Occupational Therapy

## 2021-11-03 ENCOUNTER — Encounter: Payer: PPO | Admitting: Occupational Therapy

## 2021-11-03 ENCOUNTER — Ambulatory Visit: Payer: PPO | Admitting: Physical Therapy

## 2021-11-08 ENCOUNTER — Encounter: Payer: PPO | Admitting: Occupational Therapy

## 2021-11-08 ENCOUNTER — Ambulatory Visit: Payer: PPO

## 2021-11-10 ENCOUNTER — Encounter: Payer: PPO | Admitting: Occupational Therapy

## 2021-11-10 ENCOUNTER — Ambulatory Visit: Payer: PPO | Admitting: Physical Therapy

## 2021-11-15 ENCOUNTER — Ambulatory Visit: Payer: PPO

## 2021-11-15 ENCOUNTER — Encounter: Payer: PPO | Admitting: Occupational Therapy

## 2021-11-17 ENCOUNTER — Ambulatory Visit: Payer: PPO

## 2021-11-17 ENCOUNTER — Encounter: Payer: PPO | Admitting: Occupational Therapy

## 2021-11-23 ENCOUNTER — Ambulatory Visit: Payer: PPO | Admitting: Physical Therapy

## 2021-11-23 ENCOUNTER — Encounter: Payer: PPO | Admitting: Occupational Therapy

## 2021-11-25 ENCOUNTER — Ambulatory Visit: Payer: PPO

## 2021-11-28 ENCOUNTER — Ambulatory Visit: Payer: PPO

## 2021-11-30 ENCOUNTER — Ambulatory Visit: Payer: PPO | Admitting: Physical Therapy

## 2021-12-05 ENCOUNTER — Ambulatory Visit: Payer: PPO | Admitting: Physical Therapy

## 2021-12-07 ENCOUNTER — Ambulatory Visit: Payer: PPO | Admitting: Physical Therapy

## 2021-12-12 ENCOUNTER — Ambulatory Visit: Payer: PPO

## 2021-12-14 ENCOUNTER — Ambulatory Visit: Payer: PPO | Admitting: Physical Therapy

## 2021-12-19 ENCOUNTER — Ambulatory Visit: Payer: PPO

## 2021-12-21 ENCOUNTER — Ambulatory Visit: Payer: PPO | Admitting: Physical Therapy

## 2021-12-26 ENCOUNTER — Encounter: Payer: PPO | Admitting: Occupational Therapy

## 2021-12-26 ENCOUNTER — Ambulatory Visit: Payer: PPO | Admitting: Physical Therapy

## 2021-12-28 ENCOUNTER — Ambulatory Visit: Payer: PPO

## 2022-01-02 ENCOUNTER — Ambulatory Visit: Payer: PPO | Admitting: Physical Therapy

## 2022-01-04 ENCOUNTER — Ambulatory Visit: Payer: PPO | Admitting: Physical Therapy

## 2022-01-09 ENCOUNTER — Ambulatory Visit: Payer: PPO

## 2022-01-11 ENCOUNTER — Ambulatory Visit: Payer: PPO | Admitting: Physical Therapy

## 2022-01-16 ENCOUNTER — Ambulatory Visit: Payer: PPO

## 2022-01-16 ENCOUNTER — Encounter: Payer: PPO | Admitting: Occupational Therapy

## 2022-01-18 ENCOUNTER — Ambulatory Visit: Payer: PPO

## 2022-01-23 ENCOUNTER — Encounter: Payer: PPO | Admitting: Occupational Therapy

## 2022-01-23 ENCOUNTER — Ambulatory Visit: Payer: PPO

## 2022-01-25 ENCOUNTER — Ambulatory Visit: Payer: PPO | Admitting: Physical Therapy

## 2022-01-30 ENCOUNTER — Ambulatory Visit: Payer: PPO | Admitting: Physical Therapy

## 2022-02-01 ENCOUNTER — Ambulatory Visit: Payer: PPO | Admitting: Physical Therapy

## 2022-02-06 ENCOUNTER — Ambulatory Visit: Payer: PPO | Admitting: Physical Therapy

## 2022-02-08 ENCOUNTER — Ambulatory Visit: Payer: PPO | Admitting: Physical Therapy

## 2022-02-13 ENCOUNTER — Ambulatory Visit: Payer: PPO | Admitting: Physical Therapy

## 2022-02-15 ENCOUNTER — Ambulatory Visit: Payer: PPO | Admitting: Physical Therapy

## 2022-02-20 ENCOUNTER — Ambulatory Visit: Payer: PPO | Admitting: Physical Therapy

## 2022-02-20 ENCOUNTER — Encounter: Payer: PPO | Admitting: Occupational Therapy

## 2022-02-22 ENCOUNTER — Ambulatory Visit: Payer: PPO | Admitting: Physical Therapy

## 2023-01-09 IMAGING — MR MR MRA HEAD W/O CM
1 series · 20 of 48 positions shown · non-contrast
Comparison: Brain MRI from earlier the same day

CLINICAL DATA: Neuro deficit with acute stroke suspected

EXAM:
MRA HEAD WITHOUT CONTRAST
TECHNIQUE: Angiographic images of the Circle of Willis were acquired using MRA
technique without intravenous contrast.

[Series 9: TOF · axial · 0.5mm · 0.41mm/px · z∈[-18,+78]mm · 20 of 205 slices shown]
[im 1/205]
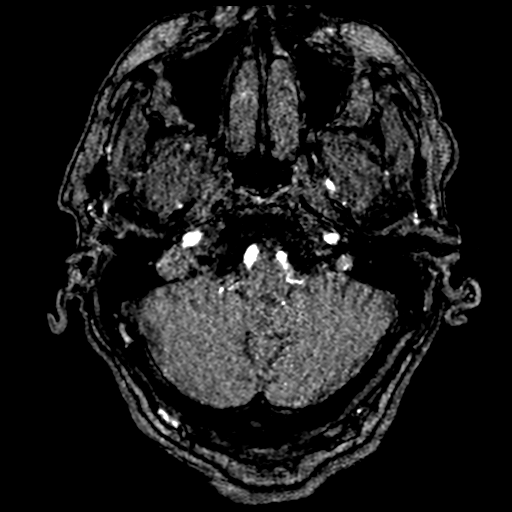
[im 5/205]
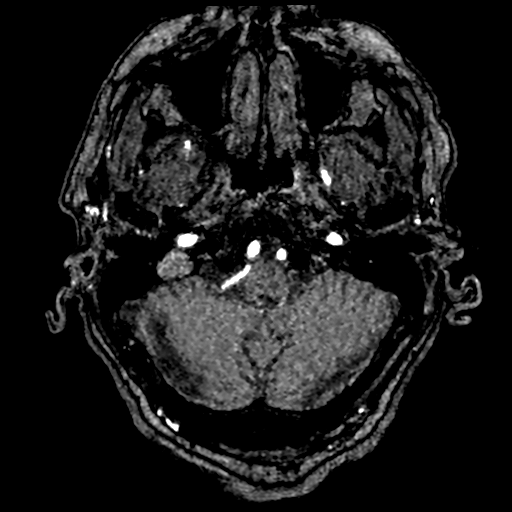
[im 9/205]
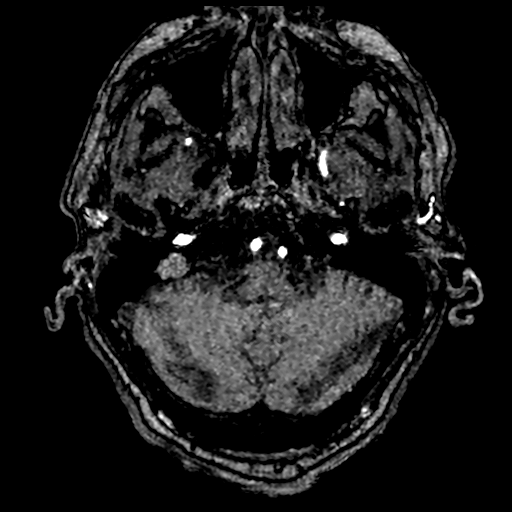
[im 14/205]
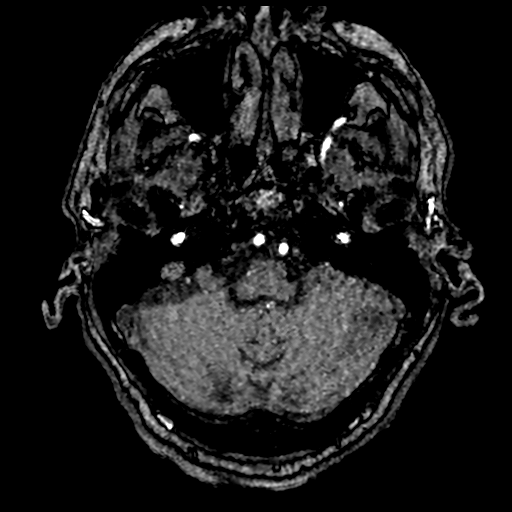
[im 18/205]
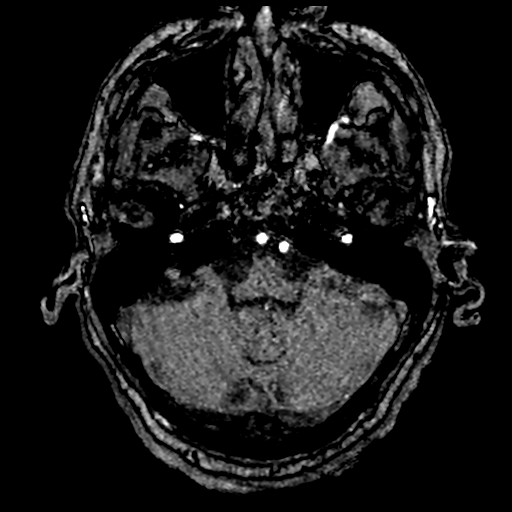
[im 22/205]
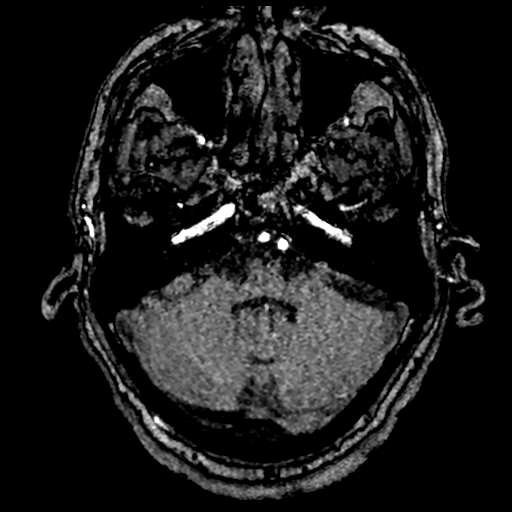
[im 27/205]
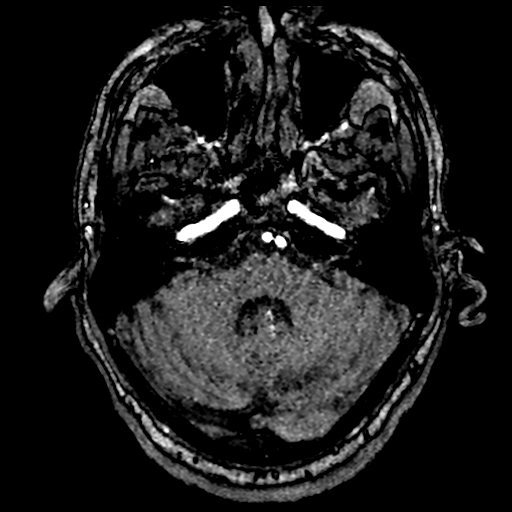
[im 31/205]
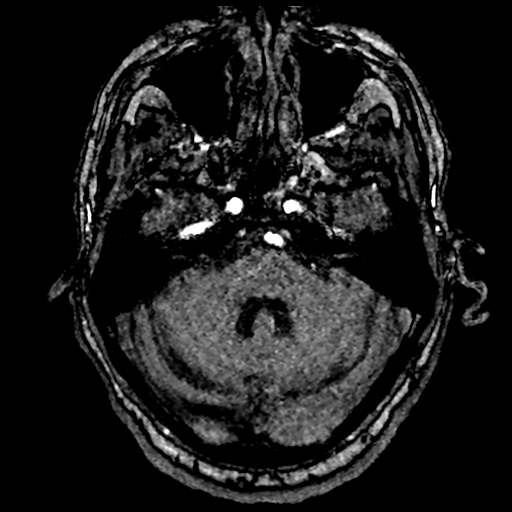
[im 35/205]
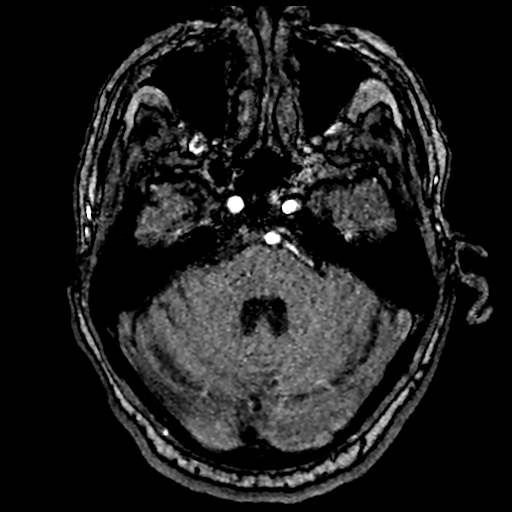
[im 40/205]
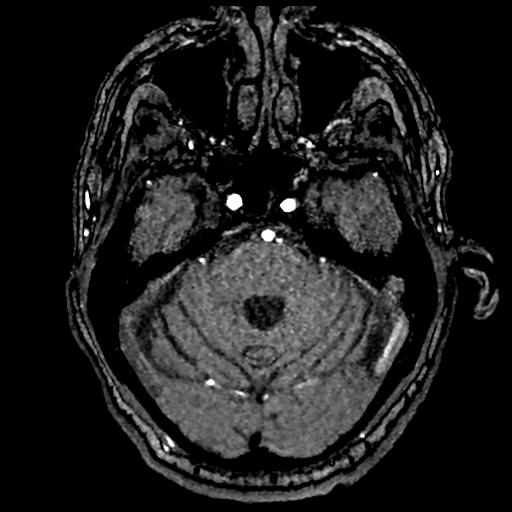
[im 44/205]
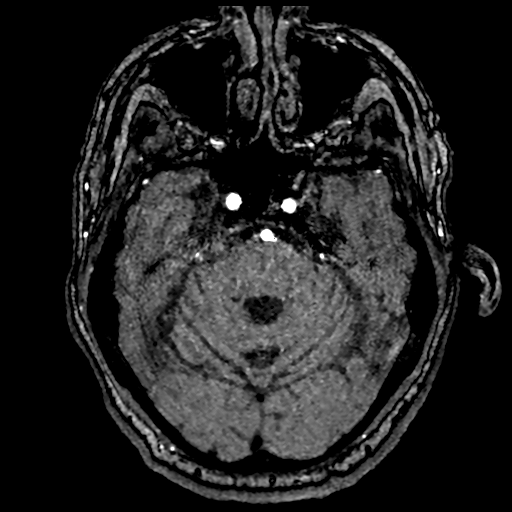
[im 48/205]
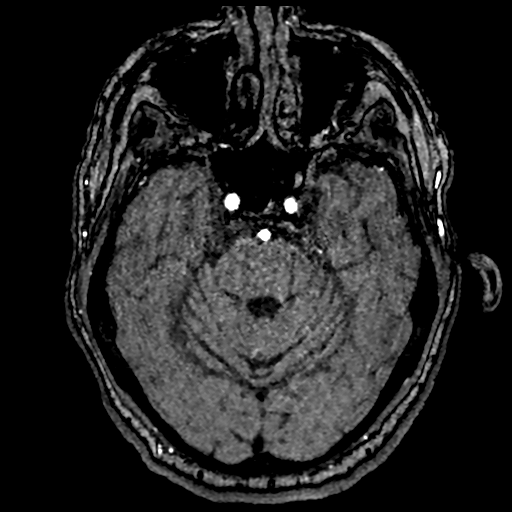
[im 66/205]
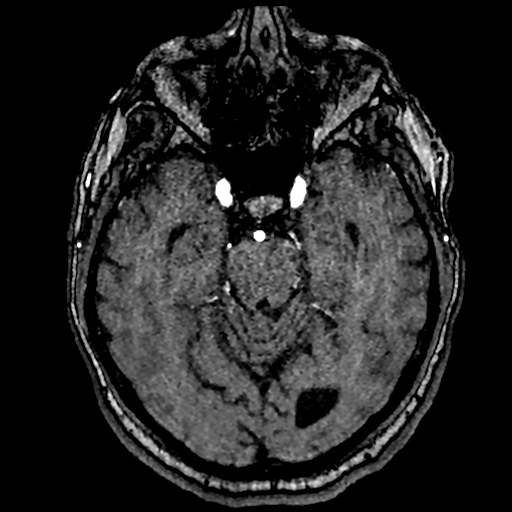
[im 92/205]
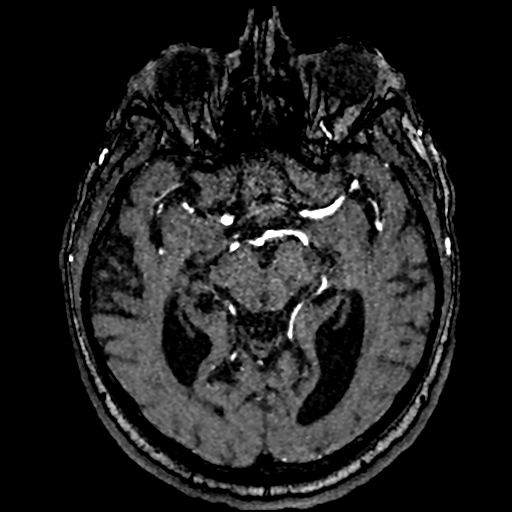
[im 105/205]
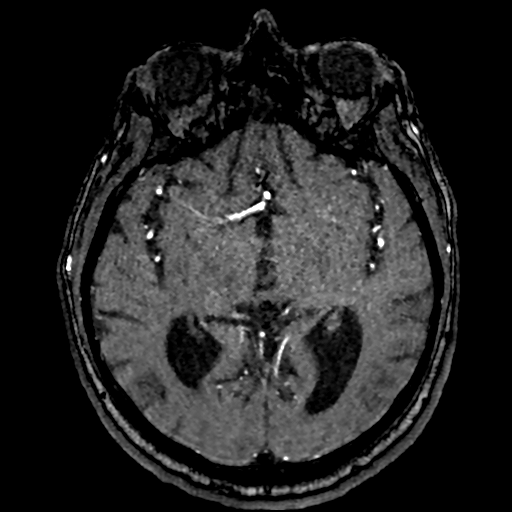
[im 118/205]
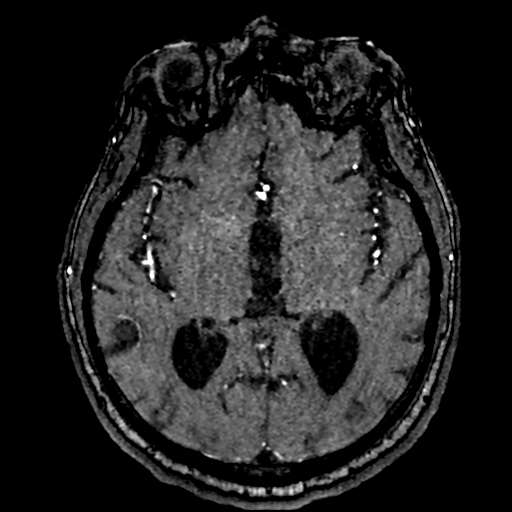
[im 144/205]
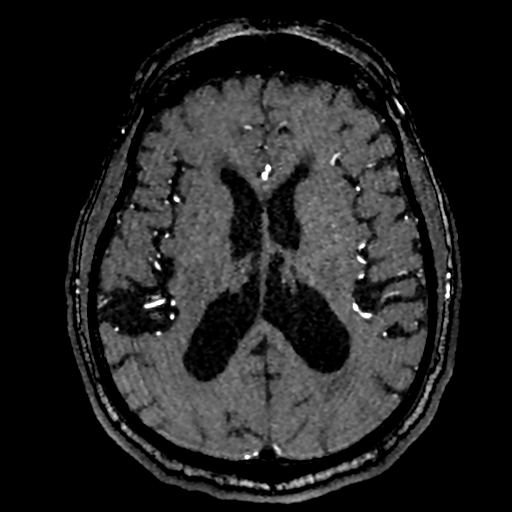
[im 170/205]
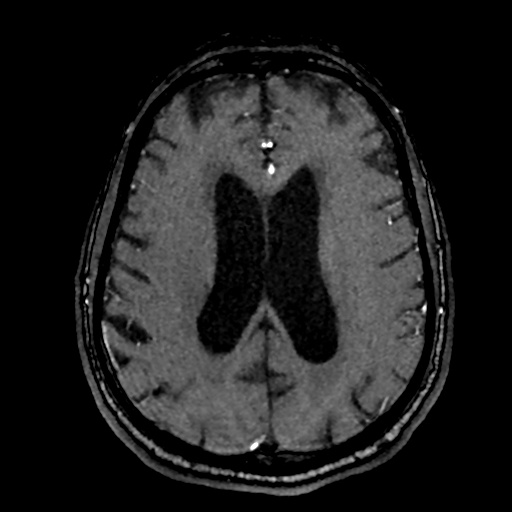
[im 174/205]
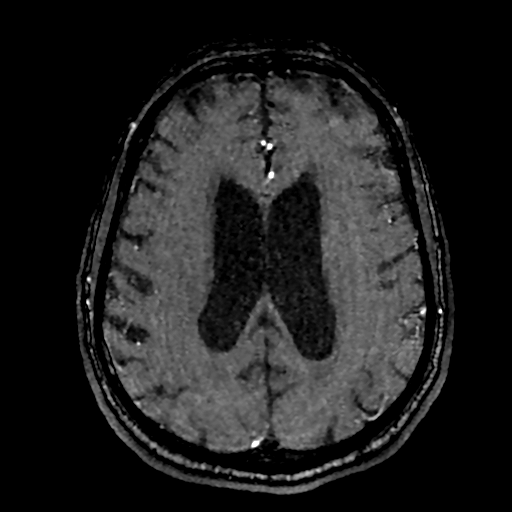
[im 196/205]
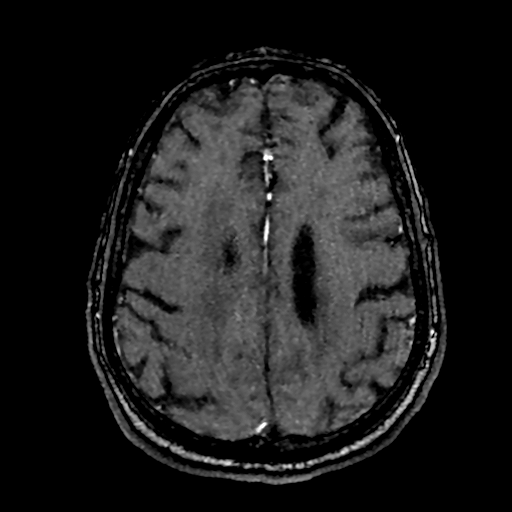

[20 of 48 positions shown; findings below may reference images not displayed]

FINDINGS: Anterior circulation: Typical of ICA artifact appearance at the
skull base. No branch occlusion, beading, or aneurysm. No
significant stenosis.

Posterior circulation: The basilar and covered vertebral arteries
are smoothly contoured and widely patent. No branch occlusion,
beading, or aneurysm.
IMPRESSION: Negative intracranial MRA.

## 2024-06-19 ENCOUNTER — Emergency Department

## 2024-06-19 ENCOUNTER — Other Ambulatory Visit: Payer: Self-pay

## 2024-06-19 ENCOUNTER — Observation Stay
Admission: EM | Admit: 2024-06-19 | Discharge: 2024-06-20 | Disposition: A | Discharge status: Home / Self Care | Attending: Obstetrics and Gynecology | Admitting: Obstetrics and Gynecology

## 2024-06-19 DIAGNOSIS — E1122 Type 2 diabetes mellitus with diabetic chronic kidney disease: Secondary | ICD-10-CM | POA: Diagnosis not present

## 2024-06-19 DIAGNOSIS — Z85048 Personal history of other malignant neoplasm of rectum, rectosigmoid junction, and anus: Secondary | ICD-10-CM | POA: Insufficient documentation

## 2024-06-19 DIAGNOSIS — R6520 Severe sepsis without septic shock: Secondary | ICD-10-CM | POA: Diagnosis not present

## 2024-06-19 DIAGNOSIS — I10 Essential (primary) hypertension: Secondary | ICD-10-CM | POA: Diagnosis present

## 2024-06-19 DIAGNOSIS — R7989 Other specified abnormal findings of blood chemistry: Secondary | ICD-10-CM | POA: Diagnosis not present

## 2024-06-19 DIAGNOSIS — I129 Hypertensive chronic kidney disease with stage 1 through stage 4 chronic kidney disease, or unspecified chronic kidney disease: Secondary | ICD-10-CM | POA: Insufficient documentation

## 2024-06-19 DIAGNOSIS — Z79899 Other long term (current) drug therapy: Secondary | ICD-10-CM | POA: Diagnosis not present

## 2024-06-19 DIAGNOSIS — G20A1 Parkinson's disease without dyskinesia, without mention of fluctuations: Secondary | ICD-10-CM | POA: Diagnosis present

## 2024-06-19 DIAGNOSIS — J101 Influenza due to other identified influenza virus with other respiratory manifestations: Secondary | ICD-10-CM | POA: Diagnosis not present

## 2024-06-19 DIAGNOSIS — R918 Other nonspecific abnormal finding of lung field: Secondary | ICD-10-CM | POA: Diagnosis not present

## 2024-06-19 DIAGNOSIS — C20 Malignant neoplasm of rectum: Secondary | ICD-10-CM | POA: Diagnosis present

## 2024-06-19 DIAGNOSIS — Z8673 Personal history of transient ischemic attack (TIA), and cerebral infarction without residual deficits: Secondary | ICD-10-CM | POA: Diagnosis not present

## 2024-06-19 DIAGNOSIS — Z932 Ileostomy status: Secondary | ICD-10-CM | POA: Diagnosis not present

## 2024-06-19 DIAGNOSIS — R531 Weakness: Secondary | ICD-10-CM | POA: Diagnosis present

## 2024-06-19 DIAGNOSIS — R6511 Systemic inflammatory response syndrome (SIRS) of non-infectious origin with acute organ dysfunction: Secondary | ICD-10-CM | POA: Diagnosis not present

## 2024-06-19 DIAGNOSIS — R296 Repeated falls: Secondary | ICD-10-CM | POA: Diagnosis not present

## 2024-06-19 DIAGNOSIS — N400 Enlarged prostate without lower urinary tract symptoms: Secondary | ICD-10-CM | POA: Diagnosis not present

## 2024-06-19 DIAGNOSIS — Z7902 Long term (current) use of antithrombotics/antiplatelets: Secondary | ICD-10-CM | POA: Insufficient documentation

## 2024-06-19 DIAGNOSIS — A419 Sepsis, unspecified organism: Secondary | ICD-10-CM | POA: Diagnosis not present

## 2024-06-19 DIAGNOSIS — N184 Chronic kidney disease, stage 4 (severe): Secondary | ICD-10-CM | POA: Insufficient documentation

## 2024-06-19 DIAGNOSIS — G20C Parkinsonism, unspecified: Secondary | ICD-10-CM | POA: Insufficient documentation

## 2024-06-19 DIAGNOSIS — R55 Syncope and collapse: Secondary | ICD-10-CM | POA: Insufficient documentation

## 2024-06-19 DIAGNOSIS — E119 Type 2 diabetes mellitus without complications: Secondary | ICD-10-CM

## 2024-06-19 DIAGNOSIS — N179 Acute kidney failure, unspecified: Principal | ICD-10-CM

## 2024-06-19 DIAGNOSIS — J189 Pneumonia, unspecified organism: Secondary | ICD-10-CM | POA: Diagnosis not present

## 2024-06-19 DIAGNOSIS — N183 Chronic kidney disease, stage 3 unspecified: Secondary | ICD-10-CM | POA: Diagnosis present

## 2024-06-19 HISTORY — DX: Parkinson's disease without dyskinesia, without mention of fluctuations: G20.A1

## 2024-06-19 LAB — CBC WITH DIFFERENTIAL/PLATELET
Abs Immature Granulocytes: 0.05 K/uL (ref 0.00–0.07)
Basophils Absolute: 0 K/uL (ref 0.0–0.1)
Basophils Relative: 0 %
Eosinophils Absolute: 0 K/uL (ref 0.0–0.5)
Eosinophils Relative: 0 %
HCT: 28.6 % — ABNORMAL LOW (ref 39.0–52.0)
Hemoglobin: 9.3 g/dL — ABNORMAL LOW (ref 13.0–17.0)
Immature Granulocytes: 1 %
Lymphocytes Relative: 11 %
Lymphs Abs: 1.2 K/uL (ref 0.7–4.0)
MCH: 28.6 pg (ref 26.0–34.0)
MCHC: 32.5 g/dL (ref 30.0–36.0)
MCV: 88 fL (ref 80.0–100.0)
Monocytes Absolute: 1.5 K/uL — ABNORMAL HIGH (ref 0.1–1.0)
Monocytes Relative: 14 %
Neutro Abs: 8.2 K/uL — ABNORMAL HIGH (ref 1.7–7.7)
Neutrophils Relative %: 74 %
Platelets: 193 K/uL (ref 150–400)
RBC: 3.25 MIL/uL — ABNORMAL LOW (ref 4.22–5.81)
RDW: 13.7 % (ref 11.5–15.5)
WBC: 10.9 K/uL — ABNORMAL HIGH (ref 4.0–10.5)
nRBC: 0 % (ref 0.0–0.2)

## 2024-06-19 LAB — COMPREHENSIVE METABOLIC PANEL WITH GFR
ALT: <5 U/L (ref 0–44)
AST: 28 U/L (ref 15–41)
Albumin: 4.6 g/dL (ref 3.5–5.0)
Alkaline Phosphatase: 67 U/L (ref 38–126)
Anion gap: 16 — ABNORMAL HIGH (ref 5–15)
BUN: 54 mg/dL — ABNORMAL HIGH (ref 8–23)
CO2: 18 mmol/L — ABNORMAL LOW (ref 22–32)
Calcium: 9.3 mg/dL (ref 8.9–10.3)
Chloride: 102 mmol/L (ref 98–111)
Creatinine, Ser: 4.71 mg/dL — ABNORMAL HIGH (ref 0.61–1.24)
GFR, Estimated: 12 mL/min — ABNORMAL LOW
Glucose, Bld: 129 mg/dL — ABNORMAL HIGH (ref 70–99)
Potassium: 4.1 mmol/L (ref 3.5–5.1)
Sodium: 136 mmol/L (ref 135–145)
Total Bilirubin: 0.6 mg/dL (ref 0.0–1.2)
Total Protein: 8.2 g/dL — ABNORMAL HIGH (ref 6.5–8.1)

## 2024-06-19 LAB — RESP PANEL BY RT-PCR (RSV, FLU A&B, COVID)  RVPGX2
Influenza A by PCR: POSITIVE — AB
Influenza B by PCR: NEGATIVE
Resp Syncytial Virus by PCR: NEGATIVE
SARS Coronavirus 2 by RT PCR: NEGATIVE

## 2024-06-19 LAB — TROPONIN T, HIGH SENSITIVITY
Troponin T High Sensitivity: 186 ng/L (ref 0–19)
Troponin T High Sensitivity: 166 ng/L (ref 0–19)

## 2024-06-19 LAB — CK: Total CK: 577 U/L — ABNORMAL HIGH (ref 49–397)

## 2024-06-19 LAB — GROUP A STREP BY PCR: Group A Strep by PCR: NOT DETECTED

## 2024-06-19 LAB — LACTIC ACID, PLASMA
Lactic Acid, Venous: 2.3 mmol/L (ref 0.5–1.9)
Lactic Acid, Venous: 1.1 mmol/L (ref 0.5–1.9)

## 2024-06-19 LAB — PROCALCITONIN: Procalcitonin: 0.31 ng/mL

## 2024-06-19 MED ORDER — HEPARIN SODIUM (PORCINE) 5000 UNIT/ML IJ SOLN
5000.0000 [IU] | Freq: Two times a day (BID) | INTRAMUSCULAR | Status: DC
  Filled 2024-06-19 (×2): qty 1

## 2024-06-19 MED ORDER — CARBIDOPA-LEVODOPA 25-100 MG PO TABS
2.0000 | ORAL_TABLET | Freq: Three times a day (TID) | ORAL | Status: DC
  Administered 2024-06-19 – 2024-06-20 (×2): 2 via ORAL
  Filled 2024-06-19 (×3): qty 2

## 2024-06-19 MED ORDER — SODIUM CHLORIDE 0.9 % IV SOLN: INTRAVENOUS | Status: DC

## 2024-06-19 MED ORDER — ALBUTEROL SULFATE (2.5 MG/3ML) 0.083% IN NEBU: 2.5000 mg | INHALATION_SOLUTION | RESPIRATORY_TRACT | Status: DC | PRN

## 2024-06-19 MED ORDER — LORATADINE 10 MG PO TABS: 10.0000 mg | ORAL_TABLET | Freq: Every day | ORAL | Status: DC

## 2024-06-19 MED ORDER — HYDRALAZINE HCL 20 MG/ML IJ SOLN: 5.0000 mg | Freq: Four times a day (QID) | INTRAMUSCULAR | Status: DC | PRN

## 2024-06-19 MED ORDER — TAMSULOSIN HCL 0.4 MG PO CAPS
0.8000 mg | ORAL_CAPSULE | Freq: Every day | ORAL | Status: DC
  Administered 2024-06-19: 0.8 mg via ORAL
  Filled 2024-06-19: qty 2

## 2024-06-19 MED ORDER — FINASTERIDE 5 MG PO TABS
5.0000 mg | ORAL_TABLET | Freq: Every day | ORAL | Status: DC
  Administered 2024-06-20: 5 mg via ORAL
  Filled 2024-06-19 (×2): qty 1

## 2024-06-19 MED ORDER — IPRATROPIUM-ALBUTEROL 0.5-2.5 (3) MG/3ML IN SOLN
3.0000 mL | Freq: Four times a day (QID) | RESPIRATORY_TRACT | Status: DC
  Administered 2024-06-19: 3 mL via RESPIRATORY_TRACT
  Filled 2024-06-19: qty 3

## 2024-06-19 MED ORDER — ATORVASTATIN CALCIUM 20 MG PO TABS
80.0000 mg | ORAL_TABLET | Freq: Every day | ORAL | Status: DC
  Filled 2024-06-19 (×2): qty 4

## 2024-06-19 MED ORDER — BENZONATATE 100 MG PO CAPS: 100.0000 mg | ORAL_CAPSULE | Freq: Three times a day (TID) | ORAL | Status: DC | PRN

## 2024-06-19 MED ORDER — ACETAMINOPHEN 500 MG PO TABS: 1000.0000 mg | ORAL_TABLET | Freq: Once | ORAL | Status: DC

## 2024-06-19 MED ORDER — ONDANSETRON 4 MG PO TBDP
4.0000 mg | ORAL_TABLET | Freq: Once | ORAL | Status: AC
  Administered 2024-06-19: 4 mg via ORAL
  Filled 2024-06-19: qty 1

## 2024-06-19 MED ORDER — PREDNISONE 20 MG PO TABS
40.0000 mg | ORAL_TABLET | Freq: Every day | ORAL | Status: DC
  Administered 2024-06-20: 40 mg via ORAL
  Filled 2024-06-19 (×2): qty 2

## 2024-06-19 MED ORDER — SODIUM CHLORIDE 0.9 % IV SOLN
1.0000 g | Freq: Once | INTRAVENOUS | Status: AC
  Administered 2024-06-19: 1 g via INTRAVENOUS
  Filled 2024-06-19: qty 10

## 2024-06-19 MED ORDER — DOXYCYCLINE HYCLATE 100 MG PO TABS
100.0000 mg | ORAL_TABLET | Freq: Two times a day (BID) | ORAL | Status: DC
  Administered 2024-06-19 – 2024-06-20 (×2): 100 mg via ORAL
  Filled 2024-06-19 (×2): qty 1

## 2024-06-19 MED ORDER — GUAIFENESIN ER 600 MG PO TB12
1200.0000 mg | ORAL_TABLET | Freq: Two times a day (BID) | ORAL | Status: DC
  Administered 2024-06-20: 1200 mg via ORAL
  Filled 2024-06-19 (×2): qty 2

## 2024-06-19 MED ORDER — SODIUM CHLORIDE 0.9 % IV BOLUS
1000.0000 mL | Freq: Once | INTRAVENOUS | Status: AC
  Administered 2024-06-19: 1000 mL via INTRAVENOUS

## 2024-06-19 MED ORDER — CLOPIDOGREL BISULFATE 75 MG PO TABS
75.0000 mg | ORAL_TABLET | Freq: Every day | ORAL | Status: DC
  Administered 2024-06-20: 75 mg via ORAL
  Filled 2024-06-19: qty 1

## 2024-06-19 MED ORDER — ACETAMINOPHEN 325 MG PO TABS
650.0000 mg | ORAL_TABLET | Freq: Once | ORAL | Status: AC | PRN
  Administered 2024-06-19: 650 mg via ORAL
  Filled 2024-06-19: qty 2

## 2024-06-19 NOTE — ED Notes (Signed)
 Was going to give tylenol  in triage then pt started dry heaving. Drawing 1 set cultures with bloodwork.

## 2024-06-19 NOTE — H&P (Addendum)
 " History and Physical    [name]:[id number] DOB: [birth year] DOA: 06/19/2024  PCP: [facility] (Confirm with patient/family/NH records and if not entered, this has to be entered at [facility] point of entry) Patient coming from: Home  I have personally briefly reviewed patient's old medical records in [facility]  Chief Complaint: Cough, SOB, feeling weak, falls at home  HPI: Justin Mann is a 78 y.o. male with medical history significant of Parkinson's disease, HTN, HLD, IIDM, CKD stage IIIb, presented with multiple complaints including sore throat, runny nose, productive cough, chills, poor appetite and p.o. intake, lightheadedness and frequent falls.  Symptoms started 3 days ago when patient started to have cold symptoms including congestion headache runny nose soon followed with a dry cough.  Next day the cough turned productive, he had episode of chills but no fever.  Denied any nauseous vomiting diarrhea abdominal pain.  But he does develop significant decrease of p.o. intake including meals and fluid.  He denies any urinary symptoms and denies any decrease of urine output.  He did not receive flu shot this year.  Last 2 days he has frequent episodes of lightheadedness and sustained several falls but denied any LOC, no head or neck injuries. ED Course: Temperature 103.1, heart rate 88 blood pressure 150/80, chest x-ray negative for acute infiltrates.  CT head and neck negative for acute intracranial findings or fracture or dislocation.  Blood work showed BUN 54, creatinine 4.7 compared to baseline 2.0-2.1, bicarb 18K 4.1.  WBC 10.9 hemoglobin 9.3 troponin 186.  Influenza A positive  Patient started on ceftriaxone  in the ED.  1 L IV bolus given in the ED.  Review of Systems: As per HPI otherwise 14 point review of systems negative.    Past Medical History:  Diagnosis Date   Agent orange exposure    Diabetes mellitus without complication (HCC)    Hypertension     Neuropathy    Parkinson's disease (HCC)    Rectal cancer (HCC)     Past Surgical History:  Procedure Laterality Date   ILEOSTOMY     TONSILLECTOMY       reports that he has never smoked. He does not have any smokeless tobacco history on file. He reports current alcohol use. He reports that he does not currently use drugs.  Allergies[1]  Family History  Problem Relation Age of Onset   Hypertension Father    Heart attack Father    Stroke Sister    Hypertension Sister     Prior to Admission medications  Medication Sig Start Date End Date Taking? Authorizing Provider  acetaminophen  (TYLENOL ) 325 MG tablet Take 1-2 tablets (325-650 mg total) by mouth every 4 (four) hours as needed for mild pain. 08/22/21   Love, Sharlet RAMAN, PA-C  amLODipine  (NORVASC ) 10 MG tablet Take 1 tablet by mouth daily. 09/06/21   [provider]  amLODipine  (NORVASC ) 5 MG tablet Take 2 tablets (10 mg total) by mouth daily. 08/30/21   Love, Sharlet RAMAN, PA-C  atorvastatin  (LIPITOR ) 80 MG tablet TAKE 1 TABLET(80 MG) BY MOUTH DAILY 10/05/21   Kirsteins, Prentice BRAVO, MD  carbidopa -levodopa  (SINEMET  IR) 25-100 MG tablet Take 2 tablets by mouth in the morning, at noon, in the evening, and at bedtime. 0500, 1000, 1400, Bedtime 05/02/21   [provider]  cholecalciferol (VITAMIN D3) 25 MCG (1000 UNIT) tablet Take 1,000 Units by mouth daily.    [provider]  clopidogrel  (PLAVIX ) 75 MG  tablet Take 1 tablet by mouth daily. 09/06/21   [provider]  Cyanocobalamin (VITAMIN B-12 PO) Take 1 tablet by mouth daily.    [provider]  finasteride  (PROSCAR ) 5 MG tablet Take 5 mg by mouth daily.    [provider]  loratadine  (CLARITIN ) 10 MG tablet Take 1 tablet (10 mg total) by mouth daily. 08/31/21   Love, Sharlet RAMAN, PA-C  polyethylene glycol (MIRALAX  / GLYCOLAX ) 17 g packet Take 17 g by mouth daily. Patient not taking: Reported on 10/14/2021 08/31/21   Love, Sharlet RAMAN, PA-C  tamsulosin   (FLOMAX ) 0.4 MG CAPS capsule Take 0.8 mg by mouth at bedtime. 07/27/20   [provider]    Physical Exam: Vitals:   06/19/24 1240 06/19/24 1353 06/19/24 1433  BP: (!) 158/86  (!) 149/80  Pulse: 88  80  Resp: 20  (!) 28  Temp: (!) 103.1 F (39.5 C) 99.6 F (37.6 C)   TempSrc: Oral Oral   SpO2: 98%  95%  Weight: 88.5 kg    Height: 5' 10 (1.778 m)      Constitutional: NAD, calm, comfortable Vitals:   06/19/24 1240 06/19/24 1353 06/19/24 1433  BP: (!) 158/86  (!) 149/80  Pulse: 88  80  Resp: 20  (!) 28  Temp: (!) 103.1 F (39.5 C) 99.6 F (37.6 C)   TempSrc: Oral Oral   SpO2: 98%  95%  Weight: 88.5 kg    Height: 5' 10 (1.778 m)     Eyes: PERRL, lids and conjunctivae normal ENMT: Mucous membranes are dry. Posterior pharynx clear of any exudate or lesions.Normal dentition.  Neck: normal, supple, no masses, no thyromegaly Respiratory: Diminished breathing sound bilaterally, scattered wheezing, no crackles. Normal respiratory effort. No accessory muscle use.  Cardiovascular: Regular rate and rhythm, no murmurs / rubs / gallops. No extremity edema. 2+ pedal pulses. No carotid bruits.  Abdomen: no tenderness, no masses palpated. No hepatosplenomegaly. Bowel sounds positive.  Musculoskeletal: no clubbing / cyanosis. No joint deformity upper and lower extremities. Good ROM, no contractures. Normal muscle tone.  Skin: no rashes, lesions, ulcers. No induration Neurologic: CN 2-12 grossly intact. Sensation intact, DTR normal. Strength 5/5 in all 4.  Psychiatric: Normal judgment and insight. Alert and oriented x 3. Normal mood.     Labs on Admission: I have personally reviewed following labs and imaging studies  CBC: Recent Labs  Lab 06/19/24 1252  WBC 10.9*  NEUTROABS 8.2*  HGB 9.3*  HCT 28.6*  MCV 88.0  PLT 193   Basic Metabolic Panel: Recent Labs  Lab 06/19/24 1252  NA 136  K 4.1  CL 102  CO2 18*  GLUCOSE 129*  BUN 54*  CREATININE 4.71*  CALCIUM  9.3    GFR: Estimated Creatinine Clearance: 14.9 mL/min (A) (by C-G formula based on SCr of 4.71 mg/dL (H)). Liver Function Tests: Recent Labs  Lab 06/19/24 1252  AST 28  ALT <5  ALKPHOS 67  BILITOT 0.6  PROT 8.2*  ALBUMIN 4.6   No results for input(s): LIPASE, AMYLASE in the last 168 hours. No results for input(s): AMMONIA in the last 168 hours. Coagulation Profile: No results for input(s): INR, PROTIME in the last 168 hours. Cardiac Enzymes: No results for input(s): CKTOTAL, CKMB, CKMBINDEX, TROPONINI in the last 168 hours. BNP (last 3 results) No results for input(s): PROBNP in the last 8760 hours. HbA1C: No results for input(s): HGBA1C in the last 72 hours. CBG: No results for input(s): GLUCAP in the last  168 hours. Lipid Profile: No results for input(s): CHOL, HDL, LDLCALC, TRIG, CHOLHDL, LDLDIRECT in the last 72 hours. Thyroid Function Tests: No results for input(s): TSH, T4TOTAL, FREET4, T3FREE, THYROIDAB in the last 72 hours. Anemia Panel: No results for input(s): VITAMINB12, FOLATE, FERRITIN, TIBC, IRON, RETICCTPCT in the last 72 hours. Urine analysis:    Component Value Date/Time   COLORURINE YELLOW (A) 08/16/2021 0612   APPEARANCEUR CLOUDY (A) 08/16/2021 0612   LABSPEC 1.017 08/16/2021 0612   PHURINE 5.0 08/16/2021 0612   GLUCOSEU NEGATIVE 08/16/2021 0612   HGBUR SMALL (A) 08/16/2021 0612   BILIRUBINUR NEGATIVE 08/16/2021 0612   KETONESUR NEGATIVE 08/16/2021 0612   PROTEINUR >=300 (A) 08/16/2021 0612   NITRITE NEGATIVE 08/16/2021 0612   LEUKOCYTESUR SMALL (A) 08/16/2021 0612    Radiological Exams on Admission: CT Head Wo Contrast Result Date: 06/19/2024 CLINICAL DATA:  Generalized weakness with multiple falls over several days. EXAM: CT HEAD WITHOUT CONTRAST CT CERVICAL SPINE WITHOUT CONTRAST TECHNIQUE: Multidetector CT imaging of the head and cervical spine was performed following the standard  protocol without intravenous contrast. Multiplanar CT image reconstructions of the cervical spine were also generated. RADIATION DOSE REDUCTION: This exam was performed according to the departmental dose-optimization program which includes automated exposure control, adjustment of the mA and/or kV according to patient size and/or use of iterative reconstruction technique. COMPARISON:  None Available. FINDINGS: CT HEAD FINDINGS Brain: There is no evidence for acute hemorrhage, hydrocephalus, mass lesion, or abnormal extra-axial fluid collection. No definite CT evidence for acute infarction. Diffuse loss of parenchymal volume is consistent with atrophy. Patchy low attenuation in the deep hemispheric and periventricular white matter is nonspecific, but likely reflects chronic microvascular ischemic demyelination. Vascular: No hyperdense vessel or unexpected calcification. Skull: No evidence for fracture. No worrisome lytic or sclerotic lesion. Sinuses/Orbits: The visualized paranasal sinuses and mastoid air cells are clear. Visualized portions of the globes and intraorbital fat are unremarkable. Other: None. CT CERVICAL SPINE FINDINGS Alignment: Normal. Skull base and vertebrae: No acute fracture. No primary bone lesion or focal pathologic process. Soft tissues and spinal canal: No prevertebral fluid or swelling. No visible canal hematoma. 2.3 cm right thyroid nodule evident. 2.0 cm nodule in the left thoracic inlet is likely exophytic from the posterior left thyroid lobe. Disc levels: Loss of disc height with endplate degeneration noted at C5-6. Facets are well aligned bilaterally. Upper chest: Unremarkable. Other: None. IMPRESSION: 1. No acute intracranial abnormality. 2. Atrophy with chronic small vessel ischemic disease. 3. No evidence for cervical spine fracture or subluxation. 4. 2.3 cm right thyroid nodule with 2.0 cm nodule in the left thoracic inlet, likely exophytic from the posterior left thyroid lobe.  Recommend thyroid US  (ref: J Am Coll Radiol. 2015 Feb;12(2): 143-50). Electronically Signed   By: Camellia Candle M.D.   On: 06/19/2024 13:47   CT Cervical Spine Wo Contrast Result Date: 06/19/2024 CLINICAL DATA:  Generalized weakness with multiple falls over several days. EXAM: CT HEAD WITHOUT CONTRAST CT CERVICAL SPINE WITHOUT CONTRAST TECHNIQUE: Multidetector CT imaging of the head and cervical spine was performed following the standard protocol without intravenous contrast. Multiplanar CT image reconstructions of the cervical spine were also generated. RADIATION DOSE REDUCTION: This exam was performed according to the departmental dose-optimization program which includes automated exposure control, adjustment of the mA and/or kV according to patient size and/or use of iterative reconstruction technique. COMPARISON:  None Available. FINDINGS: CT HEAD FINDINGS Brain: There is no evidence for acute hemorrhage, hydrocephalus, mass lesion, or  abnormal extra-axial fluid collection. No definite CT evidence for acute infarction. Diffuse loss of parenchymal volume is consistent with atrophy. Patchy low attenuation in the deep hemispheric and periventricular white matter is nonspecific, but likely reflects chronic microvascular ischemic demyelination. Vascular: No hyperdense vessel or unexpected calcification. Skull: No evidence for fracture. No worrisome lytic or sclerotic lesion. Sinuses/Orbits: The visualized paranasal sinuses and mastoid air cells are clear. Visualized portions of the globes and intraorbital fat are unremarkable. Other: None. CT CERVICAL SPINE FINDINGS Alignment: Normal. Skull base and vertebrae: No acute fracture. No primary bone lesion or focal pathologic process. Soft tissues and spinal canal: No prevertebral fluid or swelling. No visible canal hematoma. 2.3 cm right thyroid nodule evident. 2.0 cm nodule in the left thoracic inlet is likely exophytic from the posterior left thyroid lobe. Disc  levels: Loss of disc height with endplate degeneration noted at C5-6. Facets are well aligned bilaterally. Upper chest: Unremarkable. Other: None. IMPRESSION: 1. No acute intracranial abnormality. 2. Atrophy with chronic small vessel ischemic disease. 3. No evidence for cervical spine fracture or subluxation. 4. 2.3 cm right thyroid nodule with 2.0 cm nodule in the left thoracic inlet, likely exophytic from the posterior left thyroid lobe. Recommend thyroid US  (ref: J Am Coll Radiol. 2015 Feb;12(2): 143-50). Electronically Signed   By: Camellia Candle M.D.   On: 06/19/2024 13:47   DG Chest 2 View Result Date: 06/19/2024 CLINICAL DATA:  Generalized weakness with multiple recent falls. EXAM: CHEST - 2 VIEW COMPARISON:  None Available. FINDINGS: The heart size and mediastinal contours are within normal limits. Low lung volumes are noted. Both lungs are clear. There is anterior dislocation of the right shoulder. Multilevel degenerative changes are present throughout the thoracic spine. IMPRESSION: Anterior dislocation of the right shoulder. Correlation with physical examination and dedicated right shoulder plain films is recommended. Electronically Signed   By: Suzen Dials M.D.   On: 06/19/2024 13:18    EKG: Independently reviewed.  Sinus, chronic RBBB, no acute ST changes.  Assessment/Plan Principal Problem:   Sepsis (HCC)  (please populate well all problems here in Problem List. (For example, if patient is on BP meds at home and you resume or decide to hold them, it is a problem that needs to be her. Same for CAD, COPD, HLD and so on)  Sepsis with acute endorgan damage of AKI -Sepsis is evidenced by fever, leukocytosis, source of infection is flu A, clinically also suspect concurrent postviral pneumonia.  Received 1 L IV bolus, clinically still appeared to be volume contracted. - Out of window for Tamiflu , currently there is no significant hypoxia, will start patient on doxycycline  - Supportive  care, DuoNebs, guaifenesin  and incentive sublimation and flutter valve.  CAP, post viral, bacterial likely Acute bronchitis -Short course of p.o. prednisone   AKI on CKD stage IIIb - Prerenal secondary to sepsis and poor oral intake - Received IV bolus x 1 in the ED, continue maintenance IV fluid, recheck kidney function tomorrow  Elevated troponins -No chest pain, EKG showed no acute ST changes.  Suspect demand ischemia- -check echocardiogram  Near syncope, frequent falls Deconditioning - Check orthostatic vital signs - Change home BP meds to as needed hydralazine  - PT evaluation  Abnormal chest x-ray - Chest x-ray implying dislocation of right shoulder.  Physical exam, patient has no swelling or pain of right shoulder and ROM normal.  Right shoulder dislocation ruled out.  HTN - Hold off for BP meds until patient off sepsis As needed hydralazine -  Parkinson's disease - Controlled, continue Sinemet  okay  BPH - Continue finasteride  and Flomax  UA pending to rule out UTI  DVT prophylaxis: Lovenox  Code Status: Full code Family Communication: Wife at bedside Disposition Plan: Expect less than 2 midnight hospital stay Consults called: None Admission status: Telemetry observation   Cort ONEIDA Mana MD [facility] Pager [phone]  06/19/2024, 2:56 PM        [1]  Allergies Allergen Reactions   Penicillins Hives   Erythromycin Rash   "

## 2024-06-19 NOTE — ED Triage Notes (Signed)
 First nurse note: pt to ED [facility] from home for generalized weakness, multiple falls over past 2 days. Hx parkinsons. Reports has a head cold.

## 2024-06-19 NOTE — ED Triage Notes (Signed)
 Pt to ED for generalized weakness and 3 falls over last 2 days. Hx PD. Did hit head one of the times. Pt has 103.1 temp in triage. Pt has LLQ colostomy and some redness and purple discoloration to toes on L foot. Denies CP, SOB.

## 2024-06-19 NOTE — ED Provider Notes (Signed)
 "  [facility] Provider Note   Event Date/Time   First MD Initiated Contact with Patient 06/19/24 1314     (approximate) History  Fall and Weakness  HPI Justin Mann is a 78 y.o. male with a stated past medical history of stage III kidney disease, hypertension, and Parkinson's disease who presents complaining of generalized weakness with 3 falls over the last 2 days.  Patient states he did hit his head 1 of these times but does not feel like he knocked himself out.  Patient has felt febrile but did not know that he had a fever until triage where it was 103.  Patient also endorses sore throat, nasal congestion, and muscle aches.  Patient endorses sick contact at home with his wife. ROS: Patient currently denies any vision changes, tinnitus, difficulty speaking, facial droop, chest pain, shortness of breath, abdominal pain, nausea/vomiting/diarrhea, dysuria, or numbness/paresthesias in any extremity   Physical Exam  Triage Vital Signs: ED Triage Vitals  Encounter Vitals Group     BP 06/19/24 1240 (!) 158/86     Girls Systolic BP Percentile --      Girls Diastolic BP Percentile --      Boys Systolic BP Percentile --      Boys Diastolic BP Percentile --      Pulse Rate 06/19/24 1240 88     Resp 06/19/24 1240 20     Temp 06/19/24 1240 (!) 103.1 F (39.5 C)     Temp Source 06/19/24 1240 Oral     SpO2 06/19/24 1240 98 %     Weight 06/19/24 1240 195 lb (88.5 kg)     Height 06/19/24 1240 5' 10 (1.778 m)     Head Circumference --      Peak Flow --      Pain Score 06/19/24 1237 0     Pain Loc --      Pain Education --      Exclude from Growth Chart --    Most recent vital signs: Vitals:   06/19/24 1353 06/19/24 1433  BP:  (!) 149/80  Pulse:  80  Resp:  (!) 28  Temp: 99.6 F (37.6 C)   SpO2:  95%   General: Awake, oriented x4. CV:  Good peripheral perfusion. Resp:  Normal effort. Abd:  No distention. Other:  Elderly overweight Caucasian male resting  comfortably in no acute distress ED Results / Procedures / Treatments  Labs (all labs ordered are listed, but only abnormal results are displayed) Labs Reviewed  RESP PANEL BY RT-PCR (RSV, FLU A&B, COVID)  RVPGX2 - Abnormal; Notable for the following components:      Result Value   Influenza A by PCR POSITIVE (*)    All other components within normal limits  LACTIC ACID, PLASMA - Abnormal; Notable for the following components:   Lactic Acid, Venous 2.3 (*)    All other components within normal limits  COMPREHENSIVE METABOLIC PANEL WITH GFR - Abnormal; Notable for the following components:   CO2 18 (*)    Glucose, Bld 129 (*)    BUN 54 (*)    Creatinine, Ser 4.71 (*)    Total Protein 8.2 (*)    GFR, Estimated 12 (*)    Anion gap 16 (*)    All other components within normal limits  CBC WITH DIFFERENTIAL/PLATELET - Abnormal; Notable for the following components:   WBC 10.9 (*)    RBC 3.25 (*)    Hemoglobin 9.3 (*)  HCT 28.6 (*)    Neutro Abs 8.2 (*)    Monocytes Absolute 1.5 (*)    All other components within normal limits  TROPONIN T, HIGH SENSITIVITY - Abnormal; Notable for the following components:   Troponin T High Sensitivity 186 (*)    All other components within normal limits  GROUP A STREP BY PCR  CULTURE, BLOOD (ROUTINE X 2)  CULTURE, BLOOD (ROUTINE X 2)  EXPECTORATED SPUTUM ASSESSMENT W GRAM STAIN, RFLX TO RESP C  LACTIC ACID, PLASMA  URINALYSIS, W/ REFLEX TO CULTURE (INFECTION SUSPECTED)  CK  TROPONIN T, HIGH SENSITIVITY   EKG ED ECG REPORT I, Artist MARLA Kerns, the attending physician, personally viewed and interpreted this ECG. Date: 06/19/2024 EKG Time: 1241 Rate: 95 Rhythm: normal sinus rhythm QRS Axis: normal Intervals: Right bundle branch block ST/T Wave abnormalities: normal Narrative Interpretation: Normal sinus rhythm with right bundle branch block.  No evidence of acute ischemia RADIOLOGY ED MD interpretation: Chest x-ray shows anterior  dislocation of the right shoulder however at bedside, patient can range the shoulder without any pain.  No other radiologic abnormalities - All radiology independently interpreted and agree with radiology assessment Official radiology report(s): CT Head Wo Contrast Result Date: 06/19/2024 CLINICAL DATA:  Generalized weakness with multiple falls over several days. EXAM: CT HEAD WITHOUT CONTRAST CT CERVICAL SPINE WITHOUT CONTRAST TECHNIQUE: Multidetector CT imaging of the head and cervical spine was performed following the standard protocol without intravenous contrast. Multiplanar CT image reconstructions of the cervical spine were also generated. RADIATION DOSE REDUCTION: This exam was performed according to the departmental dose-optimization program which includes automated exposure control, adjustment of the mA and/or kV according to patient size and/or use of iterative reconstruction technique. COMPARISON:  None Available. FINDINGS: CT HEAD FINDINGS Brain: There is no evidence for acute hemorrhage, hydrocephalus, mass lesion, or abnormal extra-axial fluid collection. No definite CT evidence for acute infarction. Diffuse loss of parenchymal volume is consistent with atrophy. Patchy low attenuation in the deep hemispheric and periventricular white matter is nonspecific, but likely reflects chronic microvascular ischemic demyelination. Vascular: No hyperdense vessel or unexpected calcification. Skull: No evidence for fracture. No worrisome lytic or sclerotic lesion. Sinuses/Orbits: The visualized paranasal sinuses and mastoid air cells are clear. Visualized portions of the globes and intraorbital fat are unremarkable. Other: None. CT CERVICAL SPINE FINDINGS Alignment: Normal. Skull base and vertebrae: No acute fracture. No primary bone lesion or focal pathologic process. Soft tissues and spinal canal: No prevertebral fluid or swelling. No visible canal hematoma. 2.3 cm right thyroid nodule evident. 2.0 cm  nodule in the left thoracic inlet is likely exophytic from the posterior left thyroid lobe. Disc levels: Loss of disc height with endplate degeneration noted at C5-6. Facets are well aligned bilaterally. Upper chest: Unremarkable. Other: None. IMPRESSION: 1. No acute intracranial abnormality. 2. Atrophy with chronic small vessel ischemic disease. 3. No evidence for cervical spine fracture or subluxation. 4. 2.3 cm right thyroid nodule with 2.0 cm nodule in the left thoracic inlet, likely exophytic from the posterior left thyroid lobe. Recommend thyroid US  (ref: J Am Coll Radiol. 2015 Feb;12(2): 143-50). Electronically Signed   By: Camellia Candle M.D.   On: 06/19/2024 13:47   CT Cervical Spine Wo Contrast Result Date: 06/19/2024 CLINICAL DATA:  Generalized weakness with multiple falls over several days. EXAM: CT HEAD WITHOUT CONTRAST CT CERVICAL SPINE WITHOUT CONTRAST TECHNIQUE: Multidetector CT imaging of the head and cervical spine was performed following the standard protocol without intravenous contrast. Multiplanar  CT image reconstructions of the cervical spine were also generated. RADIATION DOSE REDUCTION: This exam was performed according to the departmental dose-optimization program which includes automated exposure control, adjustment of the mA and/or kV according to patient size and/or use of iterative reconstruction technique. COMPARISON:  None Available. FINDINGS: CT HEAD FINDINGS Brain: There is no evidence for acute hemorrhage, hydrocephalus, mass lesion, or abnormal extra-axial fluid collection. No definite CT evidence for acute infarction. Diffuse loss of parenchymal volume is consistent with atrophy. Patchy low attenuation in the deep hemispheric and periventricular white matter is nonspecific, but likely reflects chronic microvascular ischemic demyelination. Vascular: No hyperdense vessel or unexpected calcification. Skull: No evidence for fracture. No worrisome lytic or sclerotic lesion.  Sinuses/Orbits: The visualized paranasal sinuses and mastoid air cells are clear. Visualized portions of the globes and intraorbital fat are unremarkable. Other: None. CT CERVICAL SPINE FINDINGS Alignment: Normal. Skull base and vertebrae: No acute fracture. No primary bone lesion or focal pathologic process. Soft tissues and spinal canal: No prevertebral fluid or swelling. No visible canal hematoma. 2.3 cm right thyroid nodule evident. 2.0 cm nodule in the left thoracic inlet is likely exophytic from the posterior left thyroid lobe. Disc levels: Loss of disc height with endplate degeneration noted at C5-6. Facets are well aligned bilaterally. Upper chest: Unremarkable. Other: None. IMPRESSION: 1. No acute intracranial abnormality. 2. Atrophy with chronic small vessel ischemic disease. 3. No evidence for cervical spine fracture or subluxation. 4. 2.3 cm right thyroid nodule with 2.0 cm nodule in the left thoracic inlet, likely exophytic from the posterior left thyroid lobe. Recommend thyroid US  (ref: J Am Coll Radiol. 2015 Feb;12(2): 143-50). Electronically Signed   By: Camellia Candle M.D.   On: 06/19/2024 13:47   DG Chest 2 View Result Date: 06/19/2024 CLINICAL DATA:  Generalized weakness with multiple recent falls. EXAM: CHEST - 2 VIEW COMPARISON:  None Available. FINDINGS: The heart size and mediastinal contours are within normal limits. Low lung volumes are noted. Both lungs are clear. There is anterior dislocation of the right shoulder. Multilevel degenerative changes are present throughout the thoracic spine. IMPRESSION: Anterior dislocation of the right shoulder. Correlation with physical examination and dedicated right shoulder plain films is recommended. Electronically Signed   By: Suzen Dials M.D.   On: 06/19/2024 13:18   PROCEDURES: Critical Care performed: No Procedures MEDICATIONS ORDERED IN ED: Medications  acetaminophen  (TYLENOL ) tablet 1,000 mg (1,000 mg Oral Not Given 06/19/24 1419)   cefTRIAXone  (ROCEPHIN ) 1 g in sodium chloride  0.9 % 100 mL IVPB (1 g Intravenous New Bag/Given 06/19/24 1439)  doxycycline  (VIBRA -TABS) tablet 100 mg (has no administration in time range)  atorvastatin  (LIPITOR ) tablet 80 mg (has no administration in time range)  finasteride  (PROSCAR ) tablet 5 mg (has no administration in time range)  tamsulosin  (FLOMAX ) capsule 0.8 mg (has no administration in time range)  clopidogrel  (PLAVIX ) tablet 75 mg (has no administration in time range)  carbidopa -levodopa  (SINEMET  IR) 25-100 MG per tablet immediate release 2 tablet (has no administration in time range)  loratadine  (CLARITIN ) tablet 10 mg (has no administration in time range)  heparin  injection 5,000 Units (has no administration in time range)  predniSONE  (DELTASONE ) tablet 40 mg (has no administration in time range)  ipratropium-albuterol  (DUONEB) 0.5-2.5 (3) MG/3ML nebulizer solution 3 mL (has no administration in time range)  albuterol  (PROVENTIL ) (2.5 MG/3ML) 0.083% nebulizer solution 2.5 mg (has no administration in time range)  guaiFENesin  (MUCINEX ) 12 hr tablet 1,200 mg (has no administration in time range)  benzonatate  (TESSALON ) capsule 100 mg (has no administration in time range)  0.9 %  sodium chloride  infusion (has no administration in time range)  hydrALAZINE  (APRESOLINE ) injection 5 mg (has no administration in time range)  acetaminophen  (TYLENOL ) tablet 650 mg (650 mg Oral Given 06/19/24 1248)  ondansetron  (ZOFRAN -ODT) disintegrating tablet 4 mg (4 mg Oral Given 06/19/24 1250)  sodium chloride  0.9 % bolus 1,000 mL (1,000 mLs Intravenous New Bag/Given 06/19/24 1438)   IMPRESSION / MDM / ASSESSMENT AND PLAN / ED COURSE  I reviewed the triage vital signs and the nursing notes.                             The patient is on the cardiac monitor to evaluate for evidence of arrhythmia and/or significant heart rate changes. Patient's presentation is most consistent with acute presentation  with potential threat to life or bodily function. Patient is a 78 year old male with the above-stated past medical history presents complaining of viral symptoms including dry cough, runny nose, sore throat, chills, and generalized bodyaches. DDx: Acute on chronic kidney injury, influenza infection, pneumonia, sepsis Plan: Sepsis order set activated given patient's tachycardia, hyperthermia, and tachypnea ABX: Azithromycin, Rocephin  IVF: 1 L NS  Patient has tested positive for influenza however has also shown signs of acute on chronic renal failure with creatinine of 4.7 here today and a baseline creatinine of approximately 2.5.  Patient also shows lactic acidosis of 2.3 and troponin elevation to 186.  Given the signs of significant sepsis, patient will require admission to the internal medicine service for further evaluation and management.  Patient expressed understanding and all questions answered.  Dispo: Admit to medicine   FINAL CLINICAL IMPRESSION(S) / ED DIAGNOSES   Final diagnoses:  Acute renal failure superimposed on chronic kidney disease, unspecified acute renal failure type, unspecified CKD stage  Influenza A virus present   Rx / DC Orders   ED Discharge Orders     None      Note:  This document was prepared using Dragon voice recognition software and may include unintentional dictation errors.   Clydine Parkison K, MD 06/19/24 1505  "

## 2024-06-19 NOTE — ED Notes (Signed)
Pt transported to CT via WC. 

## 2024-06-20 ENCOUNTER — Other Ambulatory Visit: Payer: Self-pay

## 2024-06-20 ENCOUNTER — Observation Stay
Admit: 2024-06-20 | Discharge: 2024-06-20 | Disposition: A | Discharge status: Home / Self Care | Attending: Internal Medicine | Admitting: Internal Medicine

## 2024-06-20 DIAGNOSIS — N179 Acute kidney failure, unspecified: Secondary | ICD-10-CM | POA: Diagnosis not present

## 2024-06-20 DIAGNOSIS — E119 Type 2 diabetes mellitus without complications: Secondary | ICD-10-CM

## 2024-06-20 DIAGNOSIS — N184 Chronic kidney disease, stage 4 (severe): Secondary | ICD-10-CM | POA: Insufficient documentation

## 2024-06-20 DIAGNOSIS — A419 Sepsis, unspecified organism: Secondary | ICD-10-CM | POA: Diagnosis not present

## 2024-06-20 DIAGNOSIS — R6520 Severe sepsis without septic shock: Secondary | ICD-10-CM | POA: Diagnosis not present

## 2024-06-20 DIAGNOSIS — J101 Influenza due to other identified influenza virus with other respiratory manifestations: Secondary | ICD-10-CM

## 2024-06-20 DIAGNOSIS — Z8673 Personal history of transient ischemic attack (TIA), and cerebral infarction without residual deficits: Secondary | ICD-10-CM

## 2024-06-20 DIAGNOSIS — C20 Malignant neoplasm of rectum: Secondary | ICD-10-CM | POA: Diagnosis present

## 2024-06-20 LAB — CBC
HCT: 21.9 % — ABNORMAL LOW (ref 39.0–52.0)
HCT: 23.3 % — ABNORMAL LOW (ref 39.0–52.0)
Hemoglobin: 7.3 g/dL — ABNORMAL LOW (ref 13.0–17.0)
Hemoglobin: 7.7 g/dL — ABNORMAL LOW (ref 13.0–17.0)
MCH: 29.2 pg (ref 26.0–34.0)
MCH: 28.8 pg (ref 26.0–34.0)
MCHC: 33.3 g/dL (ref 30.0–36.0)
MCHC: 33 g/dL (ref 30.0–36.0)
MCV: 87.6 fL (ref 80.0–100.0)
MCV: 87.3 fL (ref 80.0–100.0)
Platelets: 143 K/uL — ABNORMAL LOW (ref 150–400)
Platelets: 150 K/uL (ref 150–400)
RBC: 2.5 MIL/uL — ABNORMAL LOW (ref 4.22–5.81)
RBC: 2.67 MIL/uL — ABNORMAL LOW (ref 4.22–5.81)
RDW: 13.6 % (ref 11.5–15.5)
RDW: 13.6 % (ref 11.5–15.5)
WBC: 11.3 K/uL — ABNORMAL HIGH (ref 4.0–10.5)
WBC: 10.1 K/uL (ref 4.0–10.5)
nRBC: 0 % (ref 0.0–0.2)
nRBC: 0 % (ref 0.0–0.2)

## 2024-06-20 LAB — ECHOCARDIOGRAM COMPLETE
AR max vel: 1.65 cm2
AV Area VTI: 1.63 cm2
AV Area mean vel: 1.63 cm2
AV Mean grad: 21.7 mmHg
AV Peak grad: 35.8 mmHg
Ao pk vel: 2.99 m/s
Height: 70 in
P 1/2 time: 535 ms
S' Lateral: 3.2 cm
Weight: 3120 [oz_av]

## 2024-06-20 LAB — BASIC METABOLIC PANEL WITH GFR
Anion gap: 14 (ref 5–15)
BUN: 51 mg/dL — ABNORMAL HIGH (ref 8–23)
CO2: 16 mmol/L — ABNORMAL LOW (ref 22–32)
Calcium: 7.9 mg/dL — ABNORMAL LOW (ref 8.9–10.3)
Chloride: 107 mmol/L (ref 98–111)
Creatinine, Ser: 4.71 mg/dL — ABNORMAL HIGH (ref 0.61–1.24)
GFR, Estimated: 12 mL/min — ABNORMAL LOW
Glucose, Bld: 141 mg/dL — ABNORMAL HIGH (ref 70–99)
Potassium: 4.1 mmol/L (ref 3.5–5.1)
Sodium: 137 mmol/L (ref 135–145)

## 2024-06-20 MED ORDER — OSELTAMIVIR PHOSPHATE 30 MG PO CAPS
30.0000 mg | ORAL_CAPSULE | Freq: Every day | ORAL | Status: DC
  Filled 2024-06-20: qty 1

## 2024-06-20 MED ORDER — CARBIDOPA-LEVODOPA 25-100 MG PO TABS: 2.5000 | ORAL_TABLET | Freq: Every day | ORAL | Status: DC

## 2024-06-20 MED ORDER — SODIUM BICARBONATE 650 MG PO TABS
650.0000 mg | ORAL_TABLET | Freq: Two times a day (BID) | ORAL | 1 refills | Status: AC
  Filled 2024-06-20: qty 180, 90d supply, fill #0

## 2024-06-20 MED ORDER — OSELTAMIVIR PHOSPHATE 30 MG PO CAPS: 30.0000 mg | ORAL_CAPSULE | Freq: Every day | ORAL | 0 refills | Status: AC

## 2024-06-20 MED ORDER — OSELTAMIVIR PHOSPHATE 30 MG PO CAPS
30.0000 mg | ORAL_CAPSULE | Freq: Every day | ORAL | 0 refills | Status: DC
  Filled 2024-06-20 (×2): qty 4, 4d supply, fill #0

## 2024-06-20 NOTE — Progress Notes (Addendum)
 SLP Cancellation Note  Patient Details Name: KHAIRI GARMAN MRN: [id number] DOB: 20-Jan-1946   Cancelled treatment:       Reason Eval/Treat Not Completed:  (chart reviewed. Arrived to room as pt was getting into the w/c w/ Staff to D/C home today.)   Per chart review and meeting w/ pt/Wife in room, pt/Wife denied any difficulty swallowing and stated he has been swallowing ok. No reports of overt dysphagia; CXR revealed: Low lung volumes are noted. Both lungs are clear.. Pt has been on a Regular diet consistency during his admit.  Pt admitted w/ c/o sore throat, runny nose, productive cough, chills, poor appetite and p.o. intake.  PMH includes Parkinson's disease, HTN, HLD, IIDM, CKD stage IIIb, and recctal Ca.  Again, Wife denied any difficulty swallowing at meals at home.   Education provided on general aspiration precautions and use of a Puree food to swallow Pills(whole) for safety when at home in setting of his Baseline dx of Parkinson's Dis. Also encouraged Wife/pt to discuss the impact of Parkinson's Dis on swallowing w/ pt's PCP/Neurologist in the future for support and education. Wife agreed.        Comer Portugal, MS, CCC-SLP Speech Language Pathologist Rehab Services; [facility] [phone] (ascom) Fatmata Legere 06/20/2024, 11:49 AM

## 2024-06-20 NOTE — Progress Notes (Signed)
*  PRELIMINARY RESULTS* Echocardiogram 2D Echocardiogram has been performed.  Justin Mann 06/20/2024, 8:52 AM

## 2024-06-20 NOTE — Progress Notes (Signed)
 Mobility Specialist - Progress Note   06/20/24 1047  Mobility  Activity Pivoted/transferred from chair to bed  Level of Assistance Contact guard assist, steadying assist  Assistive Device Front wheel walker  Distance Ambulated (ft) 2 ft  Activity Response Tolerated well  Mobility visit 1 Mobility  Mobility Specialist Start Time (ACUTE ONLY) 1040  Mobility Specialist Stop Time (ACUTE ONLY) 1046  Mobility Specialist Time Calculation (min) (ACUTE ONLY) 6 min   Pt seated in the recliner upon entry, utilizing RA. Pt required MinA to stand from the recliner, transferred to bed via SPT CGA-- tolerated well. Pt left supine with alarm set and needs within reach. Family at bedside.  America Silvan Mobility Specialist 06/20/2024 10:51 AM

## 2024-06-20 NOTE — Evaluation (Signed)
 Physical Therapy Evaluation Patient Details Name: Justin Mann MRN: [id number] DOB: 1945/10/15 Today's Date: 06/20/2024  History of Present Illness  TRACI PLEMONS is a 78 y.o. male with medical history significant of Parkinson's disease, HTN, HLD, IIDM, CKD stage IIIb, presented with multiple complaints including sore throat, runny nose, productive cough, chills, poor appetite and p.o. intake, lightheadedness and frequent falls.   Clinical Impression  Pt admitted with above diagnosis. Pt currently with functional limitations due to the deficits listed below (see PT Problem List). Pt received upright in bed agreeable to PT services. Reports PTA being mod-I with RW with mobility and ADL's in household and short community distances. Spouse assists PRN and assists with transportation.   To date, pt mod-I with bed features to sit up towards EOB however is reliant on heavy physical assist to scoot anterior for feet to reach floor. Pt unable despite education on techniques and UE use on bed rails. Pt appears very fatigued, deconditioned needing increased time for all mobility efforts. Is able to stand to RW at [facility] with bouts of momentum. Heavy BUE support needed with anterior truncal lean on RW during SPT to recliner. Pt displays increased WOB rating significant fatigue and difficulty with transfer compared to baseline. Denies any lightheadedness or dizziness with mobility. Education provided on pulmonary toileting techniques and benefits of upright sitting posture in bed and recliner. Pt with all needs in reach. Pt will benefit from skilled PT services < 3 hours/day to address acute deficits in weakness and fatigue leading to increased falls risk.     If plan is discharge home, recommend the following: A little help with bathing/dressing/bathroom;A lot of help with walking and/or transfers;Assist for transportation;Help with stairs or ramp for entrance   Can travel by private vehicle   Yes    Equipment  Recommendations Other (comment) (TBD by next venue of care)  Recommendations for Other Services       Functional Status Assessment Patient has had a recent decline in their functional status and demonstrates the ability to make significant improvements in function in a reasonable and predictable amount of time.     Precautions / Restrictions Precautions Precautions: Fall Recall of Precautions/Restrictions: Intact Restrictions Weight Bearing Restrictions Per Provider Order: No      Mobility  Bed Mobility Overal bed mobility: Needs Assistance Bed Mobility: Supine to Sit     Supine to sit: HOB elevated, Used rails     General bed mobility comments: mod to maxA for scooting anterior to EOB for feet to reach floor. Patient Response: Cooperative  Transfers Overall transfer level: Needs assistance Equipment used: Rolling walker (2 wheels) Transfers: Sit to/from Stand, Bed to chair/wheelchair/BSC Sit to Stand: Contact guard assist   Step pivot transfers: Contact guard assist       General transfer comment: Increased time and effort.    Ambulation/Gait                  Stairs            Wheelchair Mobility     Tilt Bed Tilt Bed Patient Response: Cooperative  Modified Rankin (Stroke Patients Only)       Balance Overall balance assessment: Needs assistance Sitting-balance support: Bilateral upper extremity supported, Feet supported Sitting balance-Leahy Scale: Fair       Standing balance-Leahy Scale: Poor Standing balance comment: heavy UE reliance on RW  Pertinent Vitals/Pain Pain Assessment Pain Assessment: No/denies pain    Home Living Family/patient expects to be discharged to:: Private residence Living Arrangements: Spouse/significant other Available Help at Discharge: Family;Available 24 hours/day Type of Home: House Home Access: Stairs to enter Entrance Stairs-Rails: Right;Left;Can reach  both Entrance Stairs-Number of Steps: 4   Home Layout: One level Home Equipment: Agricultural [employer]ant (2 wheels);Shower seat      Prior Function Prior Level of Function : Independent/Modified Independent             Mobility Comments: uses RW for all OOB mobility ADLs Comments: Mod-I with ADL's. Spouse drives.     Extremity/Trunk Assessment   Upper Extremity Assessment Upper Extremity Assessment: Defer to OT evaluation    Lower Extremity Assessment Lower Extremity Assessment: Generalized weakness    Cervical / Trunk Assessment Cervical / Trunk Assessment: Kyphotic  Communication   Communication Communication: Impaired Factors Affecting Communication: Hearing impaired    Cognition Arousal: Alert Behavior During Therapy: WFL for tasks assessed/performed   PT - Cognitive impairments: No apparent impairments                                 Cueing Cueing Techniques: Verbal cues     General Comments      Exercises Other Exercises Other Exercises: Role of PT in acute setting. Pulmonary toileting.   Assessment/Plan    PT Assessment Patient needs continued PT services  PT Problem List Decreased strength;Decreased activity tolerance;Decreased balance       PT Treatment Interventions DME instruction;Therapeutic exercise;Gait training;Balance training;Stair training;Neuromuscular re-education;Functional mobility training;Patient/family education;Therapeutic activities    PT Goals (Current goals can be found in the Care Plan section)  Acute Rehab PT Goals Patient Stated Goal: to feel better PT Goal Formulation: With patient Time For Goal Achievement: 07/04/24 Potential to Achieve Goals: Good    Frequency Min 2X/week     Co-evaluation               AM-PAC PT 6 Clicks Mobility  Outcome Measure Help needed turning from your back to your side while in a flat bed without using bedrails?: A Lot Help needed moving from lying on your back to  sitting on the side of a flat bed without using bedrails?: A Lot Help needed moving to and from a bed to a chair (including a wheelchair)?: A Lot Help needed standing up from a chair using your arms (e.g., wheelchair or bedside chair)?: A Little Help needed to walk in hospital room?: A Lot Help needed climbing 3-5 steps with a railing? : Total 6 Click Score: 12    End of Session Equipment Utilized During Treatment: Gait belt Activity Tolerance: Patient limited by fatigue Patient left: in chair;with call bell/phone within reach;with chair alarm set Nurse Communication: Mobility status PT Visit Diagnosis: Muscle weakness (generalized) (M62.81);History of falling (Z91.81);Difficulty in walking, not elsewhere classified (R26.2)    Time: 9079-9063 PT Time Calculation (min) (ACUTE ONLY): 16 min   Charges:   PT Evaluation $PT Eval Moderate Complexity: 1 Mod   PT General Charges $$ ACUTE PT VISIT: 1 Visit         Marissia Blackham M. Fairly IV, PT, DPT Physical Therapist- [place]  [facility] 06/20/2024, 9:46 AM

## 2024-06-20 NOTE — Plan of Care (Signed)

## 2024-06-20 NOTE — Discharge Summary (Addendum)
 Justin Mann FMW:[id number] DOB: [birth year] DOA: 06/19/2024  PCP: [facility]  Admit date: 06/19/2024 Discharge date: 06/20/2024  Time spent: 35 minutes  Recommendations for Outpatient Follow-up:  Pcp f/u 1 week, check bmp and CK  and cbc then Consider thyroid u/s Nephrology f/u     Discharge Diagnoses:  Principal Problem:   Sepsis (HCC) Active Problems:   Essential hypertension   Parkinson disease (HCC)   BPH (benign prostatic hyperplasia)   Diabetes mellitus (HCC)   Rectal cancer (HCC)   History of CVA (cerebrovascular accident)   CKD (chronic kidney disease) stage 4, GFR 15-29 ml/min (HCC)   Discharge Condition: stable  Diet recommendation: heart healthy  Filed Weights   06/19/24 1240  Weight: 88.5 kg    History of present illness:  From admission h and p Justin Mann is a 78 y.o. male with medical history significant of Parkinson's disease, HTN, HLD, IIDM, CKD stage IIIb, presented with multiple complaints including sore throat, runny nose, productive cough, chills, poor appetite and p.o. intake, lightheadedness and frequent falls.   Symptoms started 3 days ago when patient started to have cold symptoms including congestion headache runny nose soon followed with a dry cough.  Next day the cough turned productive, he had episode of chills but no fever.  Denied any nauseous vomiting diarrhea abdominal pain.  But he does develop significant decrease of p.o. intake including meals and fluid.  He denies any urinary symptoms and denies any decrease of urine output.  He did not receive flu shot this year.  Last 2 days he has frequent episodes of lightheadedness and sustained several falls but denied any LOC, no head or neck injuries.  Hospital Course:   Patient presents with 3 days cough and fever and sore throat. Wife with similar symptoms. Found to be influenza a positive. CXR no infiltrate. CXR did suggest right shoulder dislocation but normal function, do not  think dislocated. No hypoxia. Tolerating PO. No dysuria (does have history recurrent UTI). Tamiflu  held but started on doxycycline  for pneumonia and prednisone  for bronchitis. Does not appear to have pneumonia (short duration of symptoms, no infiltrate on cxr) so will hold additional abx. No history copd or asthma, do not think steroids indicated and so will hold them. Given underlying comorbid conditions and hospitalization think reasonable to treat with renally-dosed tamiflu  and patient agrees, so have started that. Patient has ckd 4, cr was 4.7 at recent [place] admit and it's there today. Patient aware he is moving toward esrd but says he is not willing to start dialysis. Reports normal urination. Bicarb low so will start oral supplement. Advise close pcp f/u, will need check of kidney function and bmp then. CK also mildly elevated here so advise holding home statin pending pcp f/u. Incidental thyroid nodule seen on CT of head/neck - consider outpt thyroid u/s if consistent w/ goals of care. Evaluated by PT who advises skilled nursing but patient declines, opts instead for home with home health. Reports he has all needed DME. 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   Procedures: none   Consultations: none  Discharge Exam: Vitals:   06/20/24 0430 06/20/24 0729  BP: (!) 147/76 (!) 144/77  Pulse: 77 85  Resp: 20 18  Temp: 99.4 F (37.4 C) 99.7 F (  37.6 C)  SpO2: 95% 94%    General: NAD Cardiovascular: RRR Respiratory: few rhonchi otherwise clear Ext: warm, no edema Abd: ostomy in place, brown stool  Discharge Instructions   Discharge Instructions     Increase activity slowly   Complete by: As directed       Allergies as of 06/20/2024       Reactions   Penicillins Hives   Erythromycin Rash         Medication List     PAUSE taking these medications    atorvastatin  80 MG tablet Wait to take this until your doctor or other care provider tells you to start again. Commonly known as: LIPITOR  TAKE 1 TABLET(80 MG) BY MOUTH DAILY       TAKE these medications    carbidopa -levodopa  25-100 MG tablet Commonly known as: SINEMET  IR Take 2-2.5 tablets by mouth in the morning, at noon, in the evening, and at bedtime. Take two tablets by mouth at 0500, 1000, 1400, and two and one-half tablets at bedtime The timing of this medication is very important.   acetaminophen  325 MG tablet Commonly known as: TYLENOL  Take 1-2 tablets (325-650 mg total) by mouth every 4 (four) hours as needed for mild pain.   amLODipine  10 MG tablet Commonly known as: NORVASC  Take 10 mg by mouth daily. What changed: Another medication with the same name was removed. Continue taking this medication, and follow the directions you see here.   cholecalciferol 25 MCG (1000 UNIT) tablet Commonly known as: VITAMIN D3 Take 1,000 Units by mouth daily.   clopidogrel  75 MG tablet Commonly known as: PLAVIX  Take 1 tablet by mouth daily.   finasteride  5 MG tablet Commonly known as: PROSCAR  Take 5 mg by mouth daily.   loratadine  10 MG tablet Commonly known as: CLARITIN  Take 1 tablet (10 mg total) by mouth daily.   losartan 25 MG tablet Commonly known as: COZAAR Take 25 mg by mouth daily.   oseltamivir  30 MG capsule Commonly known as: TAMIFLU  Take 1 capsule (30 mg total) by mouth daily for 4 days.   polyethylene glycol 17 g packet Commonly known as: MIRALAX  / GLYCOLAX  Take 17 g by mouth daily.   sodium bicarbonate  650 MG tablet Take 1 tablet (650 mg total) by mouth 2 (two) times daily.   tamsulosin  0.4 MG Caps capsule Commonly known as: FLOMAX  Take 0.8 mg by mouth at bedtime.   VITAMIN B-12 PO Take 1 tablet by mouth daily.       Allergies[1]  Follow-up Information     Your primary care  provider Follow up.   Why: about 1 week        your nephrologist Follow up.   Why: soon                 The results of significant diagnostics from this hospitalization (including imaging, microbiology, ancillary and laboratory) are listed below for reference.    Significant Diagnostic Studies: CT Head Wo Contrast Result Date: 06/19/2024 CLINICAL DATA:  Generalized weakness with multiple falls over several days. EXAM: CT HEAD WITHOUT CONTRAST CT CERVICAL SPINE WITHOUT CONTRAST TECHNIQUE: Multidetector CT imaging of the head and cervical spine was performed following the standard protocol without intravenous contrast. Multiplanar CT image reconstructions of the cervical spine were also generated. RADIATION DOSE REDUCTION: This exam was performed according to the departmental dose-optimization program which includes automated exposure control, adjustment of the mA and/or kV according to patient size and/or use of iterative reconstruction technique.  COMPARISON:  None Available. FINDINGS: CT HEAD FINDINGS Brain: There is no evidence for acute hemorrhage, hydrocephalus, mass lesion, or abnormal extra-axial fluid collection. No definite CT evidence for acute infarction. Diffuse loss of parenchymal volume is consistent with atrophy. Patchy low attenuation in the deep hemispheric and periventricular white matter is nonspecific, but likely reflects chronic microvascular ischemic demyelination. Vascular: No hyperdense vessel or unexpected calcification. Skull: No evidence for fracture. No worrisome lytic or sclerotic lesion. Sinuses/Orbits: The visualized paranasal sinuses and mastoid air cells are clear. Visualized portions of the globes and intraorbital fat are unremarkable. Other: None. CT CERVICAL SPINE FINDINGS Alignment: Normal. Skull base and vertebrae: No acute fracture. No primary bone lesion or focal pathologic process. Soft tissues and spinal canal: No prevertebral fluid or swelling. No  visible canal hematoma. 2.3 cm right thyroid nodule evident. 2.0 cm nodule in the left thoracic inlet is likely exophytic from the posterior left thyroid lobe. Disc levels: Loss of disc height with endplate degeneration noted at C5-6. Facets are well aligned bilaterally. Upper chest: Unremarkable. Other: None. IMPRESSION: 1. No acute intracranial abnormality. 2. Atrophy with chronic small vessel ischemic disease. 3. No evidence for cervical spine fracture or subluxation. 4. 2.3 cm right thyroid nodule with 2.0 cm nodule in the left thoracic inlet, likely exophytic from the posterior left thyroid lobe. Recommend thyroid US  (ref: J Am Coll Radiol. 2015 Feb;12(2): 143-50). Electronically Signed   By: Camellia Candle M.D.   On: 06/19/2024 13:47   CT Cervical Spine Wo Contrast Result Date: 06/19/2024 CLINICAL DATA:  Generalized weakness with multiple falls over several days. EXAM: CT HEAD WITHOUT CONTRAST CT CERVICAL SPINE WITHOUT CONTRAST TECHNIQUE: Multidetector CT imaging of the head and cervical spine was performed following the standard protocol without intravenous contrast. Multiplanar CT image reconstructions of the cervical spine were also generated. RADIATION DOSE REDUCTION: This exam was performed according to the departmental dose-optimization program which includes automated exposure control, adjustment of the mA and/or kV according to patient size and/or use of iterative reconstruction technique. COMPARISON:  None Available. FINDINGS: CT HEAD FINDINGS Brain: There is no evidence for acute hemorrhage, hydrocephalus, mass lesion, or abnormal extra-axial fluid collection. No definite CT evidence for acute infarction. Diffuse loss of parenchymal volume is consistent with atrophy. Patchy low attenuation in the deep hemispheric and periventricular white matter is nonspecific, but likely reflects chronic microvascular ischemic demyelination. Vascular: No hyperdense vessel or unexpected calcification. Skull: No  evidence for fracture. No worrisome lytic or sclerotic lesion. Sinuses/Orbits: The visualized paranasal sinuses and mastoid air cells are clear. Visualized portions of the globes and intraorbital fat are unremarkable. Other: None. CT CERVICAL SPINE FINDINGS Alignment: Normal. Skull base and vertebrae: No acute fracture. No primary bone lesion or focal pathologic process. Soft tissues and spinal canal: No prevertebral fluid or swelling. No visible canal hematoma. 2.3 cm right thyroid nodule evident. 2.0 cm nodule in the left thoracic inlet is likely exophytic from the posterior left thyroid lobe. Disc levels: Loss of disc height with endplate degeneration noted at C5-6. Facets are well aligned bilaterally. Upper chest: Unremarkable. Other: None. IMPRESSION: 1. No acute intracranial abnormality. 2. Atrophy with chronic small vessel ischemic disease. 3. No evidence for cervical spine fracture or subluxation. 4. 2.3 cm right thyroid nodule with 2.0 cm nodule in the left thoracic inlet, likely exophytic from the posterior left thyroid lobe. Recommend thyroid US  (ref: J Am Coll Radiol. 2015 Feb;12(2): 143-50). Electronically Signed   By: Camellia Candle M.D.   On: 06/19/2024  13:47   DG Chest 2 View Result Date: 06/19/2024 CLINICAL DATA:  Generalized weakness with multiple recent falls. EXAM: CHEST - 2 VIEW COMPARISON:  None Available. FINDINGS: The heart size and mediastinal contours are within normal limits. Low lung volumes are noted. Both lungs are clear. There is anterior dislocation of the right shoulder. Multilevel degenerative changes are present throughout the thoracic spine. IMPRESSION: Anterior dislocation of the right shoulder. Correlation with physical examination and dedicated right shoulder plain films is recommended. Electronically Signed   By: Suzen Dials M.D.   On: 06/19/2024 13:18    Microbiology: Recent Results (from the past 240 hours)  Resp panel by RT-PCR (RSV, Flu A&B, Covid) Anterior  Nasal Swab     Status: Abnormal   Collection Time: 06/19/24 12:42 PM   Specimen: Anterior Nasal Swab  Result Value Ref Range Status   SARS Coronavirus 2 by RT PCR NEGATIVE NEGATIVE Final    Comment: (NOTE) SARS-CoV-2 target nucleic acids are NOT DETECTED.  The SARS-CoV-2 RNA is generally detectable in upper respiratory specimens during the acute phase of infection. The lowest concentration of SARS-CoV-2 viral copies this assay can detect is 138 copies/mL. A negative result does not preclude SARS-Cov-2 infection and should not be used as the sole basis for treatment or other patient management decisions. A negative result may occur with  improper specimen collection/handling, submission of specimen other than nasopharyngeal swab, presence of viral mutation(s) within the areas targeted by this assay, and inadequate number of viral copies(<138 copies/mL). A negative result must be combined with clinical observations, patient history, and epidemiological information. The expected result is Negative.  Fact Sheet for Patients:  bloggercourse.com  Fact Sheet for Healthcare Providers:  seriousbroker.it  This test is no t yet approved or cleared by the [place]  FDA and  has been authorized for detection and/or diagnosis of SARS-CoV-2 by FDA under an Emergency Use Authorization (EUA). This EUA will remain  in effect (meaning this test can be used) for the duration of the COVID-19 declaration under Section 564(b)(1) of the Act, 21 U.S.C.section 360bbb-3(b)(1), unless the authorization is terminated  or revoked sooner.       Influenza A by PCR POSITIVE (A) NEGATIVE Final   Influenza B by PCR NEGATIVE NEGATIVE Final    Comment: (NOTE) The Xpert Xpress SARS-CoV-2/FLU/RSV plus assay is intended as an aid in the diagnosis of influenza from Nasopharyngeal swab specimens and should not be used as a sole basis for treatment. Nasal washings  and aspirates are unacceptable for Xpert Xpress SARS-CoV-2/FLU/RSV testing.  Fact Sheet for Patients: bloggercourse.com  Fact Sheet for Healthcare Providers: seriousbroker.it  This test is not yet approved or cleared by the [place]  FDA and has been authorized for detection and/or diagnosis of SARS-CoV-2 by FDA under an Emergency Use Authorization (EUA). This EUA will remain in effect (meaning this test can be used) for the duration of the COVID-19 declaration under Section 564(b)(1) of the Act, 21 U.S.C. section 360bbb-3(b)(1), unless the authorization is terminated or revoked.     Resp Syncytial Virus by PCR NEGATIVE NEGATIVE Final    Comment: (NOTE) Fact Sheet for Patients: bloggercourse.com  Fact Sheet for Healthcare Providers: seriousbroker.it  This test is not yet approved or cleared by the [place]  FDA and has been authorized for detection and/or diagnosis of SARS-CoV-2 by FDA under an Emergency Use Authorization (EUA). This EUA will remain in effect (meaning this test can be used) for the duration of the COVID-19 declaration under Section  564(b)(1) of the Act, 21 U.S.C. section 360bbb-3(b)(1), unless the authorization is terminated or revoked.  Performed at [facility], [address] [place], [place] [address]   Group A Strep by PCR if patient complains of sore throat.     Status: None   Collection Time: 06/19/24 12:52 PM   Specimen: Anterior Nasal Swab; Sterile Swab  Result Value Ref Range Status   Group A Strep by PCR NOT DETECTED NOT DETECTED Final    Comment: Performed at [facility], [address] [place], [place] [address]  Blood culture (routine x 2)     Status: None (Preliminary result)   Collection Time: 06/19/24  2:32 PM   Specimen: BLOOD  Result Value Ref Range Status   Specimen Description BLOOD BLOOD RIGHT FOREARM   Final   Special Requests   Final    BOTTLES DRAWN AEROBIC AND ANAEROBIC Blood Culture results may not be optimal due to an inadequate volume of blood received in culture bottles   Culture   Final    NO GROWTH < 24 HOURS Performed at [facility], [address] [place], [place] [address]    Report Status PENDING  Incomplete  Blood culture (routine x 2)     Status: None (Preliminary result)   Collection Time: 06/19/24  5:13 PM   Specimen: BLOOD  Result Value Ref Range Status   Specimen Description BLOOD BLOOD LEFT HAND  Final   Special Requests   Final    BOTTLES DRAWN AEROBIC AND ANAEROBIC Blood Culture results may not be optimal due to an inadequate volume of blood received in culture bottles   Culture   Final    NO GROWTH < 24 HOURS Performed at [facility], [address] [place], [place] [address]    Report Status PENDING  Incomplete     Labs: Basic Metabolic Panel: Recent Labs  Lab 06/19/24 1252 06/20/24 0524  NA 136 137  K 4.1 4.1  CL 102 107  CO2 18* 16*  GLUCOSE 129* 141*  BUN 54* 51*  CREATININE 4.71* 4.71*  CALCIUM  9.3 7.9*   Liver Function Tests: Recent Labs  Lab 06/19/24 1252  AST 28  ALT <5  ALKPHOS 67  BILITOT 0.6  PROT 8.2*  ALBUMIN 4.6   No results for input(s): LIPASE, AMYLASE in the last 168 hours. No results for input(s): AMMONIA in the last 168 hours. CBC: Recent Labs  Lab 06/19/24 1252 06/20/24 0524  WBC 10.9* 11.3*  NEUTROABS 8.2*  --   HGB 9.3* 7.3*  HCT 28.6* 21.9*  MCV 88.0 87.6  PLT 193 143*   Cardiac Enzymes: Recent Labs  Lab 06/19/24 1713  CKTOTAL 577*   BNP: BNP (last 3 results) No results for input(s): BNP in the last 8760 hours.  ProBNP (last 3 results) No results for input(s): PROBNP in the last 8760 hours.  CBG: No results for input(s): GLUCAP in the last 168 hours.     Signed:  Devaughn KATHEE Ban MD.  [facility] 06/20/2024, 10:35 AM      [1]   Allergies Allergen Reactions   Penicillins Hives   Erythromycin Rash

## 2024-06-24 ENCOUNTER — Other Ambulatory Visit: Payer: Self-pay

## 2024-06-24 LAB — CULTURE, BLOOD (ROUTINE X 2)
Culture: NO GROWTH
Culture: NO GROWTH
